# Patient Record
Sex: Female | Born: 1940 | Race: Black or African American | Hispanic: No | Smoking: Never smoker
Health system: Southern US, Community
[De-identification: ages and names within clinical notes are randomized; demographics above are authoritative.]

## PROBLEM LIST (undated history)

## (undated) DIAGNOSIS — J449 Chronic obstructive pulmonary disease, unspecified: Secondary | ICD-10-CM

## (undated) DIAGNOSIS — G2571 Drug induced akathisia: Secondary | ICD-10-CM

## (undated) DIAGNOSIS — D638 Anemia in other chronic diseases classified elsewhere: Secondary | ICD-10-CM

## (undated) DIAGNOSIS — E039 Hypothyroidism, unspecified: Secondary | ICD-10-CM

## (undated) DIAGNOSIS — E042 Nontoxic multinodular goiter: Secondary | ICD-10-CM

## (undated) DIAGNOSIS — H401290 Low-tension glaucoma, unspecified eye, stage unspecified: Secondary | ICD-10-CM

## (undated) DIAGNOSIS — E559 Vitamin D deficiency, unspecified: Secondary | ICD-10-CM

## (undated) DIAGNOSIS — J4489 Other specified chronic obstructive pulmonary disease: Secondary | ICD-10-CM

## (undated) DIAGNOSIS — N183 Chronic kidney disease, stage 3 unspecified: Secondary | ICD-10-CM

## (undated) DIAGNOSIS — G309 Alzheimer's disease, unspecified: Secondary | ICD-10-CM

## (undated) DIAGNOSIS — F0280 Dementia in other diseases classified elsewhere without behavioral disturbance: Secondary | ICD-10-CM

## (undated) DIAGNOSIS — I10 Essential (primary) hypertension: Secondary | ICD-10-CM

## (undated) DIAGNOSIS — K219 Gastro-esophageal reflux disease without esophagitis: Secondary | ICD-10-CM

## (undated) DIAGNOSIS — G4700 Insomnia, unspecified: Secondary | ICD-10-CM

## (undated) DIAGNOSIS — F323 Major depressive disorder, single episode, severe with psychotic features: Secondary | ICD-10-CM

## (undated) DIAGNOSIS — E1140 Type 2 diabetes mellitus with diabetic neuropathy, unspecified: Secondary | ICD-10-CM

## (undated) DIAGNOSIS — L439 Lichen planus, unspecified: Secondary | ICD-10-CM

## (undated) DIAGNOSIS — F0390 Unspecified dementia without behavioral disturbance: Secondary | ICD-10-CM

## (undated) HISTORY — DX: Drug induced akathisia: G25.71

## (undated) HISTORY — DX: Dementia in other diseases classified elsewhere, unspecified severity, without behavioral disturbance, psychotic disturbance, mood disturbance, and anxiety: F02.80

## (undated) HISTORY — DX: Chronic kidney disease, stage 3 (moderate): N18.3

## (undated) HISTORY — DX: Other specified chronic obstructive pulmonary disease: J44.89

## (undated) HISTORY — DX: Nontoxic multinodular goiter: E04.2

## (undated) HISTORY — DX: Hypothyroidism, unspecified: E03.9

## (undated) HISTORY — DX: Alzheimer's disease, unspecified: G30.9

## (undated) HISTORY — DX: Chronic kidney disease, stage 3 unspecified: N18.30

## (undated) HISTORY — DX: Gastro-esophageal reflux disease without esophagitis: K21.9

## (undated) HISTORY — DX: Anemia in other chronic diseases classified elsewhere: D63.8

## (undated) HISTORY — DX: Major depressive disorder, single episode, severe with psychotic features: F32.3

## (undated) HISTORY — DX: Low-tension glaucoma, unspecified eye, stage unspecified: H40.1290

## (undated) HISTORY — DX: Type 2 diabetes mellitus with diabetic neuropathy, unspecified: E11.40

## (undated) HISTORY — DX: Chronic obstructive pulmonary disease, unspecified: J44.9

## (undated) HISTORY — DX: Lichen planus, unspecified: L43.9

## (undated) HISTORY — DX: Insomnia, unspecified: G47.00

## (undated) HISTORY — PX: OTHER SURGICAL HISTORY: SHX169

## (undated) HISTORY — PX: ABDOMINAL HYSTERECTOMY: SHX81

## (undated) HISTORY — PX: KNEE SURGERY: SHX244

## (undated) HISTORY — DX: Vitamin D deficiency, unspecified: E55.9

---

## 2011-12-09 ENCOUNTER — Emergency Department (HOSPITAL_BASED_OUTPATIENT_CLINIC_OR_DEPARTMENT_OTHER)
Admission: EM | Admit: 2011-12-09 | Discharge: 2011-12-09 | Disposition: A | Discharge status: Home / Self Care | Payer: Medicare Other | Attending: Emergency Medicine | Admitting: Emergency Medicine

## 2011-12-09 ENCOUNTER — Emergency Department (HOSPITAL_BASED_OUTPATIENT_CLINIC_OR_DEPARTMENT_OTHER): Payer: Medicare Other

## 2011-12-09 ENCOUNTER — Encounter (HOSPITAL_BASED_OUTPATIENT_CLINIC_OR_DEPARTMENT_OTHER): Payer: Self-pay | Admitting: *Deleted

## 2011-12-09 DIAGNOSIS — Z79899 Other long term (current) drug therapy: Secondary | ICD-10-CM | POA: Insufficient documentation

## 2011-12-09 DIAGNOSIS — E860 Dehydration: Secondary | ICD-10-CM | POA: Insufficient documentation

## 2011-12-09 DIAGNOSIS — F0390 Unspecified dementia without behavioral disturbance: Secondary | ICD-10-CM | POA: Insufficient documentation

## 2011-12-09 DIAGNOSIS — N390 Urinary tract infection, site not specified: Secondary | ICD-10-CM | POA: Insufficient documentation

## 2011-12-09 DIAGNOSIS — E119 Type 2 diabetes mellitus without complications: Secondary | ICD-10-CM | POA: Insufficient documentation

## 2011-12-09 DIAGNOSIS — K5900 Constipation, unspecified: Secondary | ICD-10-CM | POA: Insufficient documentation

## 2011-12-09 DIAGNOSIS — I10 Essential (primary) hypertension: Secondary | ICD-10-CM | POA: Insufficient documentation

## 2011-12-09 HISTORY — DX: Essential (primary) hypertension: I10

## 2011-12-09 HISTORY — DX: Unspecified dementia, unspecified severity, without behavioral disturbance, psychotic disturbance, mood disturbance, and anxiety: F03.90

## 2011-12-09 LAB — COMPREHENSIVE METABOLIC PANEL
Albumin: 3.5 g/dL (ref 3.5–5.2)
BUN: 42 mg/dL — ABNORMAL HIGH (ref 6–23)
Calcium: 9.2 mg/dL (ref 8.4–10.5)
Creatinine, Ser: 2.3 mg/dL — ABNORMAL HIGH (ref 0.50–1.10)
Potassium: 3.6 mEq/L (ref 3.5–5.1)
Total Protein: 6.3 g/dL (ref 6.0–8.3)

## 2011-12-09 LAB — URINE MICROSCOPIC-ADD ON

## 2011-12-09 LAB — URINALYSIS, ROUTINE W REFLEX MICROSCOPIC
Glucose, UA: NEGATIVE mg/dL
Specific Gravity, Urine: 1.017 (ref 1.005–1.030)
pH: 5.5 (ref 5.0–8.0)

## 2011-12-09 LAB — CBC WITH DIFFERENTIAL/PLATELET
Basophils Relative: 0 % (ref 0–1)
Eosinophils Absolute: 0 10*3/uL (ref 0.0–0.7)
Hemoglobin: 8.8 g/dL — ABNORMAL LOW (ref 12.0–15.0)
MCH: 28.3 pg (ref 26.0–34.0)
MCHC: 32.8 g/dL (ref 30.0–36.0)
Monocytes Absolute: 0.4 10*3/uL (ref 0.1–1.0)
Monocytes Relative: 5 % (ref 3–12)
Neutrophils Relative %: 87 % — ABNORMAL HIGH (ref 43–77)

## 2011-12-09 MED ORDER — POLYETHYLENE GLYCOL 3350 17 G PO PACK: 17.0000 g | PACK | Freq: Every day | ORAL | Status: AC

## 2011-12-09 MED ORDER — SODIUM CHLORIDE 0.9 % IV BOLUS (SEPSIS)
500.0000 mL | Freq: Once | INTRAVENOUS | Status: AC
  Administered 2011-12-09: 19:00:00 via INTRAVENOUS

## 2011-12-09 MED ORDER — MINERAL OIL RE ENEM
1.0000 | ENEMA | Freq: Once | RECTAL | Status: AC
  Administered 2011-12-09: 1 via RECTAL
  Filled 2011-12-09: qty 1

## 2011-12-09 MED ORDER — CEPHALEXIN 500 MG PO CAPS: 500.0000 mg | ORAL_CAPSULE | Freq: Four times a day (QID) | ORAL | Status: AC

## 2011-12-09 NOTE — ED Provider Notes (Signed)
History     CSN: [id number]  Arrival date & time 12/09/11  1553   First MD Initiated Contact with Patient 12/09/11 1621      Chief Complaint  Patient presents with  . Constipation  . Dehydration    (Consider location/radiation/quality/duration/timing/severity/associated sxs/prior treatment) Patient is a 71 y.o. female presenting with constipation. The history is provided by the patient and a relative. No language interpreter was used.  Constipation  The current episode started more than 2 weeks ago. The problem occurs continuously. The problem has been gradually worsening. The pain is moderate. There was no prior successful therapy. There was no prior unsuccessful therapy. Associated symptoms include anorexia. She has been drinking less than usual and eating less than usual.  Pt recently moved here from [place] to be with family.   Pt complains of being constipated.  Pt's family reports pt has not been drinking normally.  They are concerned about dehydration.   Pt does not have a local MD  Past Medical History  Diagnosis Date  . Hypertension   . Diabetes mellitus   . Dementia     History reviewed. No pertinent past surgical history.  History reviewed. No pertinent family history.  History  Substance Use Topics  . Smoking status: Not on file  . Smokeless tobacco: Not on file  . Alcohol Use:     OB History    Grav Para Term Preterm Abortions TAB SAB Ect Mult Living                  Review of Systems  Gastrointestinal: Positive for constipation and anorexia.  All other systems reviewed and are negative.    Allergies  Review of patient's allergies indicates no known allergies.  Home Medications   Current Outpatient Rx  Name Route Sig Dispense Refill  . AMLODIPINE BESYLATE 10 MG PO TABS Oral Take 10 mg by mouth daily.    . DONEPEZIL HCL 10 MG PO TABS Oral Take 10 mg by mouth at bedtime as needed.    Marland Kitchen ESCITALOPRAM OXALATE 20 MG PO TABS Oral Take 20 mg by mouth  daily.    Marland Kitchen LEVOTHYROXINE SODIUM 25 MCG PO TABS Oral Take 25 mcg by mouth daily.    Marland Kitchen LISINOPRIL 20 MG PO TABS Oral Take 20 mg by mouth daily.    . ANTI-DIARRHEAL PO Oral Take 2 tablets by mouth daily as needed. For diarrheal symptoms.    Marland Kitchen METFORMIN HCL 500 MG PO TABS Oral Take 500 mg by mouth 2 (two) times daily with a meal.    . MIRTAZAPINE 30 MG PO TABS Oral Take 30 mg by mouth at bedtime.    Marland Kitchen PANTOPRAZOLE SODIUM 40 MG PO TBEC Oral Take 40 mg by mouth daily.    Marland Kitchen PRAVASTATIN SODIUM 40 MG PO TABS Oral Take 40 mg by mouth daily.    Marland Kitchen RISPERIDONE 2 MG PO TBDP Oral Take 2 mg by mouth 2 (two) times daily.    Marland Kitchen ZOLPIDEM TARTRATE 5 MG PO TABS Oral Take 5 mg by mouth at bedtime as needed.      BP 113/42  Pulse 106  Temp 98.2 F (36.8 C) (Oral)  Resp 16  Ht 5\' 1"  (1.549 m)  Wt 115 lb (52.164 kg)  BMI 21.73 kg/m2  SpO2 100%  Physical Exam  Nursing note and vitals reviewed. Constitutional: She is oriented to person, place, and time. She appears well-developed and well-nourished.  HENT:  Head: Normocephalic and atraumatic.  Eyes:  Conjunctivae and EOM are normal. Pupils are equal, round, and reactive to light.  Neck: Normal range of motion. Neck supple.  Cardiovascular: Normal rate and normal heart sounds.   Pulmonary/Chest: Effort normal and breath sounds normal.  Abdominal: Soft.  Musculoskeletal: Normal range of motion.  Neurological: She is alert and oriented to person, place, and time. She has normal reflexes.  Skin: Skin is warm.  Psychiatric: She has a normal mood and affect.    ED Course  Procedures (including critical care time)  Labs Reviewed - No data to display No results found.   No diagnosis found.  Results for orders placed during the hospital encounter of 12/09/11  CBC WITH DIFFERENTIAL      Component Value Range   WBC 8.7  4.0 - 10.5 K/uL   RBC 3.11 (*) 3.87 - 5.11 MIL/uL   Hemoglobin 8.8 (*) 12.0 - 15.0 g/dL   HCT 40.9 (*) 81.1 - 91.4 %   MCV 86.2  78.0  - 100.0 fL   MCH 28.3  26.0 - 34.0 pg   MCHC 32.8  30.0 - 36.0 g/dL   RDW 78.2  95.6 - 21.3 %   Platelets 256  150 - 400 K/uL   Neutrophils Relative 87 (*) 43 - 77 %   Neutro Abs 7.5  1.7 - 7.7 K/uL   Lymphocytes Relative 8 (*) 12 - 46 %   Lymphs Abs 0.7  0.7 - 4.0 K/uL   Monocytes Relative 5  3 - 12 %   Monocytes Absolute 0.4  0.1 - 1.0 K/uL   Eosinophils Relative 1  0 - 5 %   Eosinophils Absolute 0.0  0.0 - 0.7 K/uL   Basophils Relative 0  0 - 1 %   Basophils Absolute 0.0  0.0 - 0.1 K/uL  COMPREHENSIVE METABOLIC PANEL      Component Value Range   Sodium 144  135 - 145 mEq/L   Potassium 3.6  3.5 - 5.1 mEq/L   Chloride 104  96 - 112 mEq/L   CO2 25  19 - 32 mEq/L   Glucose, Bld 108 (*) 70 - 99 mg/dL   BUN 42 (*) 6 - 23 mg/dL   Creatinine, Ser 0.86 (*) 0.50 - 1.10 mg/dL   Calcium 9.2  8.4 - 57.8 mg/dL   Total Protein 6.3  6.0 - 8.3 g/dL   Albumin 3.5  3.5 - 5.2 g/dL   AST 13  0 - 37 U/L   ALT 8  0 - 35 U/L   Alkaline Phosphatase 52  39 - 117 U/L   Total Bilirubin 0.1 (*) 0.3 - 1.2 mg/dL   GFR calc non Af Amer 20 (*) >90 mL/min   GFR calc Af Amer 24 (*) >90 mL/min  URINALYSIS, ROUTINE W REFLEX MICROSCOPIC      Component Value Range   Color, Urine YELLOW  YELLOW   APPearance CLOUDY (*) CLEAR   Specific Gravity, Urine 1.017  1.005 - 1.030   pH 5.5  5.0 - 8.0   Glucose, UA NEGATIVE  NEGATIVE mg/dL   Hgb urine dipstick SMALL (*) NEGATIVE   Bilirubin Urine SMALL (*) NEGATIVE   Ketones, ur 15 (*) NEGATIVE mg/dL   Protein, ur NEGATIVE  NEGATIVE mg/dL   Urobilinogen, UA 1.0  0.0 - 1.0 mg/dL   Nitrite NEGATIVE  NEGATIVE   Leukocytes, UA LARGE (*) NEGATIVE  URINE MICROSCOPIC-ADD ON      Component Value Range   Squamous Epithelial / LPF FEW (*)  RARE   WBC, UA 11-20  <3 WBC/hpf   RBC / HPF 3-6  <3 RBC/hpf   Bacteria, UA FEW (*) RARE   Dg Abd Acute W/chest  12/09/2011  *RADIOLOGY REPORT*  Clinical Data: Abdominal pain, constipation  ACUTE ABDOMEN SERIES (ABDOMEN 2 VIEW & CHEST  1 VIEW)  Comparison: None.  Findings: Lungs are clear.  Heart size normal.  No effusion.  No free air.  Small bowel decompressed.  Large amount of fecal material in the distal colon and rectum.  No abnormal abdominal calcifications.  Spondylitic changes in the thoracic spine.  IMPRESSION:  1.  Nonobstructive bowel gas pattern with a large amount of distal colonic and rectal fecal material. 2.  No free air. 3.  No acute cardiopulmonary disease.  Original Report Authenticated By: Osa Craver, M.D.     MDM  Pt given IV fluids,  I will treat for uti.   I gave referral to Dr. Rodena Medin and primary MD's.   Pt given rx for miralax and keflex.           Lonia Skinner [place], [place] 12/09/11 1950

## 2011-12-09 NOTE — ED Provider Notes (Signed)
Medical screening examination/treatment/procedure(s) were performed by non-physician practitioner and as supervising physician I was immediately available for consultation/collaboration.   Charles B. Bernette Mayers, MD 12/09/11 [phone]

## 2011-12-09 NOTE — ED Notes (Signed)
Brought in by ems from home , c/o constipation and dehydration x 1 week

## 2011-12-09 NOTE — ED Notes (Signed)
EMS started IV and gave 500 ml ns d/t initial BP 60

## 2011-12-09 NOTE — ED Notes (Signed)
Pt transported to and from radiology via stretcher.

## 2012-10-28 ENCOUNTER — Other Ambulatory Visit: Payer: Self-pay | Admitting: Internal Medicine

## 2012-10-28 DIAGNOSIS — R109 Unspecified abdominal pain: Secondary | ICD-10-CM

## 2012-11-02 ENCOUNTER — Ambulatory Visit
Admission: RE | Admit: 2012-11-02 | Discharge: 2012-11-02 | Disposition: A | Discharge status: Home / Self Care | Payer: No Typology Code available for payment source | Source: Ambulatory Visit | Attending: Internal Medicine | Admitting: Internal Medicine

## 2012-11-02 DIAGNOSIS — R109 Unspecified abdominal pain: Secondary | ICD-10-CM

## 2013-04-12 ENCOUNTER — Ambulatory Visit: Payer: Self-pay | Admitting: Podiatry

## 2013-10-14 ENCOUNTER — Other Ambulatory Visit: Payer: Self-pay | Admitting: Family Medicine

## 2013-10-14 DIAGNOSIS — K219 Gastro-esophageal reflux disease without esophagitis: Secondary | ICD-10-CM

## 2013-10-14 DIAGNOSIS — R0989 Other specified symptoms and signs involving the circulatory and respiratory systems: Secondary | ICD-10-CM

## 2013-10-15 ENCOUNTER — Other Ambulatory Visit: Payer: Self-pay | Admitting: Family Medicine

## 2013-10-15 DIAGNOSIS — R0989 Other specified symptoms and signs involving the circulatory and respiratory systems: Secondary | ICD-10-CM

## 2013-10-15 DIAGNOSIS — K219 Gastro-esophageal reflux disease without esophagitis: Secondary | ICD-10-CM

## 2013-10-18 ENCOUNTER — Ambulatory Visit
Admission: RE | Admit: 2013-10-18 | Discharge: 2013-10-18 | Disposition: A | Discharge status: Home / Self Care | Payer: No Typology Code available for payment source | Source: Ambulatory Visit | Attending: Family Medicine | Admitting: Family Medicine

## 2013-10-18 ENCOUNTER — Other Ambulatory Visit: Payer: Self-pay | Admitting: Family Medicine

## 2013-10-18 DIAGNOSIS — K219 Gastro-esophageal reflux disease without esophagitis: Secondary | ICD-10-CM

## 2013-10-18 DIAGNOSIS — R0989 Other specified symptoms and signs involving the circulatory and respiratory systems: Secondary | ICD-10-CM

## 2013-10-20 ENCOUNTER — Encounter: Payer: Self-pay | Admitting: Gastroenterology

## 2013-10-26 ENCOUNTER — Other Ambulatory Visit (HOSPITAL_COMMUNITY): Payer: Self-pay | Admitting: *Deleted

## 2013-10-26 ENCOUNTER — Other Ambulatory Visit: Payer: Self-pay | Admitting: *Deleted

## 2013-10-26 DIAGNOSIS — E041 Nontoxic single thyroid nodule: Secondary | ICD-10-CM

## 2013-10-27 ENCOUNTER — Other Ambulatory Visit (HOSPITAL_COMMUNITY): Payer: Self-pay | Admitting: *Deleted

## 2013-10-27 DIAGNOSIS — E041 Nontoxic single thyroid nodule: Secondary | ICD-10-CM

## 2013-11-08 ENCOUNTER — Ambulatory Visit (HOSPITAL_COMMUNITY)
Admission: RE | Admit: 2013-11-08 | Discharge: 2013-11-08 | Disposition: A | Discharge status: Home / Self Care | Payer: Medicare (Managed Care) | Source: Ambulatory Visit | Attending: *Deleted | Admitting: *Deleted

## 2013-11-08 DIAGNOSIS — E041 Nontoxic single thyroid nodule: Secondary | ICD-10-CM

## 2013-11-08 DIAGNOSIS — E042 Nontoxic multinodular goiter: Secondary | ICD-10-CM | POA: Diagnosis present

## 2013-11-08 NOTE — Procedures (Signed)
  Rt thyroid nodule US biopsy 25 g x 4  Pt tolerated well

## 2013-12-09 ENCOUNTER — Other Ambulatory Visit (HOSPITAL_COMMUNITY): Payer: Self-pay | Admitting: Family Medicine

## 2013-12-09 DIAGNOSIS — H3412 Central retinal artery occlusion, left eye: Secondary | ICD-10-CM

## 2013-12-16 ENCOUNTER — Ambulatory Visit (HOSPITAL_COMMUNITY)
Admission: RE | Admit: 2013-12-16 | Discharge: 2013-12-16 | Disposition: A | Discharge status: Home / Self Care | Payer: Medicare (Managed Care) | Source: Ambulatory Visit | Attending: Family Medicine | Admitting: Family Medicine

## 2013-12-16 DIAGNOSIS — H3410 Central retinal artery occlusion, unspecified eye: Secondary | ICD-10-CM | POA: Insufficient documentation

## 2013-12-16 DIAGNOSIS — H3412 Central retinal artery occlusion, left eye: Secondary | ICD-10-CM

## 2013-12-16 DIAGNOSIS — R072 Precordial pain: Secondary | ICD-10-CM

## 2013-12-16 DIAGNOSIS — H349 Unspecified retinal vascular occlusion: Secondary | ICD-10-CM

## 2013-12-16 DIAGNOSIS — I517 Cardiomegaly: Secondary | ICD-10-CM

## 2013-12-16 DIAGNOSIS — I6529 Occlusion and stenosis of unspecified carotid artery: Secondary | ICD-10-CM | POA: Diagnosis not present

## 2013-12-16 NOTE — Progress Notes (Signed)
*  PRELIMINARY RESULTS* Vascular Ultrasound Carotid Duplex (Doppler) has been completed.  Findings suggest 1-39% internal carotid artery stenosis bilaterally. Vertebral arteries are patent with antegrade flow.  12/16/2013 7:21 PM Gertie FeyMichelle Skylynne Schlechter, RVT, RDCS, RDMS

## 2013-12-16 NOTE — Progress Notes (Signed)
Echo Lab  2D Echocardiogram completed.  Zenovia Justman L Ronson Hagins, RDCS 12/16/2013 1:18 PM

## 2013-12-28 ENCOUNTER — Ambulatory Visit: Payer: Medicare Other | Admitting: Gastroenterology

## 2013-12-28 ENCOUNTER — Encounter: Payer: Self-pay | Admitting: Gastroenterology

## 2014-03-02 ENCOUNTER — Encounter: Payer: Self-pay | Admitting: Gastroenterology

## 2014-03-02 ENCOUNTER — Ambulatory Visit (INDEPENDENT_AMBULATORY_CARE_PROVIDER_SITE_OTHER): Payer: Medicare (Managed Care) | Admitting: Gastroenterology

## 2014-03-02 VITALS — BP 130/70 | HR 80 | Ht 63.0 in | Wt 129.0 lb

## 2014-03-02 DIAGNOSIS — R1310 Dysphagia, unspecified: Secondary | ICD-10-CM

## 2014-03-02 MED ORDER — PANTOPRAZOLE SODIUM 40 MG PO TBEC
40.0000 mg | DELAYED_RELEASE_TABLET | Freq: Two times a day (BID) | ORAL | Status: DC
Start: 2014-03-02 — End: 2016-10-08

## 2014-03-02 MED ORDER — PANTOPRAZOLE SODIUM 40 MG PO TBEC: 40.0000 mg | DELAYED_RELEASE_TABLET | Freq: Two times a day (BID) | ORAL | Status: DC

## 2014-03-02 NOTE — Progress Notes (Signed)
HPI: This is a   Very pleasant 73-year-old woman whom I am meeting for the first time today. She is not a very good historian.  Feels food hangs, catches in mid throat.  No liquid dyshagia.  Will occasionally vomit the food.   For about 2 months.  Weight up and down.  Never had EGD.  Pyrosis, much worse lately.  Has taken tums at home, no help.    Listed to be taking protonix dialy, not sure how long.  Review of systems: Pertinent positive and negative review of systems were noted in the above HPI section. Complete review of systems was performed and was otherwise normal.    Past Medical History  Diagnosis Date  . Hypertension   . Diabetes mellitus   . Dementia   . Alzheimer's disease   . Anemia of other chronic disease   . Chronic obstructive asthma, unspecified   . Esophageal reflux   . Unspecified hypothyroidism   . Nontoxic multinodular goiter   . 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 Unspecified vitamin D deficiency   . Psychotic depression   . Insomnia   . Drug induced akathisia   . Diabetic neuropathy   . Chronic kidney disease (CKD), stage III (moderate)   . Lichen planus     History reviewed. No pertinent past surgical history.  Current Outpatient Prescriptions  Medication Sig Dispense Refill  . amLODipine (NORVASC) 10 MG tablet Take 10 mg by mouth daily.    . donepezil (ARICEPT) 10 MG tablet Take 10 mg by mouth at bedtime as needed.    . escitalopram (LEXAPRO) 20 MG tablet Take 20 mg by mouth daily.    . levothyroxine (SYNTHROID, LEVOTHROID) 25 MCG tablet Take 25 mcg by mouth daily.    . lisinopril (PRINIVIL,ZESTRIL) 20 MG tablet Take 20 mg by mouth daily.    . Loperamide HCl (ANTI-DIARRHEAL PO) Take 2 tablets by mouth daily as needed. For diarrheal symptoms.    . mirtazapine (REMERON) 30 MG tablet Take 30 mg by mouth at bedtime.    . pantoprazole (PROTONIX) 40 MG tablet Take 40 mg by mouth daily.    . pravastatin (PRAVACHOL) 40 MG tablet Take 40 mg  by mouth daily.    . risperiDONE (RISPERDAL M-TABS) 2 MG disintegrating tablet Take 2 mg by mouth 2 (two) times daily.    . zolpidem (AMBIEN) 5 MG tablet Take 5 mg by mouth at bedtime as needed.     No current facility-administered medications for this visit.    Allergies as of 03/02/2014  . (No Known Allergies)    Family History  Problem Relation Age of Onset  . Cancer Brother     ?  . Cancer Sister     ?  . Breast cancer Sister     x2  . Heart attack Sister     History   Social History  . Marital Status: Widowed    Spouse Name: N/A    Number of Children: N/A  . Years of Education: N/A   Occupational History  . Not on file.   Social History Main Topics  . Smoking status: Never Smoker   . Smokeless tobacco: Never Used  . Alcohol Use: No  . Drug Use: No  . Sexual Activity: Not on file   Other Topics Concern  . Not on file   Social History Narrative       Physical Exam: BP 130/70 mmHg  Pulse 80  Ht 5' 3" (1.6 m)    Wt 129 lb (58.514 kg)  BMI 22.86 kg/m2 Constitutional: generally well-appearing Psychiatric: alert and oriented x3 Eyes: extraocular movements intact Mouth: oral pharynx moist, no lesions Neck: supple no lymphadenopathy Cardiovascular: heart regular rate and rhythm Lungs: clear to auscultation bilaterally Abdomen: soft, nontender, nondistended, no obvious ascites, no peritoneal signs, normal bowel sounds Extremities: no lower extremity edema bilaterally Skin: no lesions on visible extremities    Assessment and plan: 73 y.o. female with  Chronic GERD, intermittent solid food dysphagia  I have recommended that she increase her proton pump inhibitor to twice daily shortly before meals. I would like to proceed with EGD at her soonest convenience for her chronic GERD and her intermittent dysphagia.

## 2014-03-02 NOTE — Patient Instructions (Addendum)
Increase your protonix to one pill twice daily (you will be handed a prescription) You will be set up for an upper endoscopy for GERD, dysphagia ([facility], MAC sedation)

## 2014-03-14 ENCOUNTER — Encounter (HOSPITAL_COMMUNITY): Payer: Self-pay | Admitting: *Deleted

## 2014-03-31 ENCOUNTER — Encounter (HOSPITAL_COMMUNITY): Payer: Self-pay | Admitting: Gastroenterology

## 2014-03-31 ENCOUNTER — Ambulatory Visit (HOSPITAL_COMMUNITY): Payer: Medicare (Managed Care) | Admitting: Anesthesiology

## 2014-03-31 ENCOUNTER — Ambulatory Visit (HOSPITAL_COMMUNITY)
Admission: RE | Admit: 2014-03-31 | Discharge: 2014-03-31 | Disposition: A | Discharge status: Home / Self Care | Payer: Medicare (Managed Care) | Source: Ambulatory Visit | Attending: Gastroenterology | Admitting: Gastroenterology

## 2014-03-31 ENCOUNTER — Encounter (HOSPITAL_COMMUNITY)
Admission: RE | Disposition: A | Discharge status: Home / Self Care | Payer: Self-pay | Source: Ambulatory Visit | Attending: Gastroenterology

## 2014-03-31 DIAGNOSIS — I129 Hypertensive chronic kidney disease with stage 1 through stage 4 chronic kidney disease, or unspecified chronic kidney disease: Secondary | ICD-10-CM | POA: Diagnosis not present

## 2014-03-31 DIAGNOSIS — G2571 Drug induced akathisia: Secondary | ICD-10-CM | POA: Insufficient documentation

## 2014-03-31 DIAGNOSIS — G309 Alzheimer's disease, unspecified: Secondary | ICD-10-CM | POA: Diagnosis not present

## 2014-03-31 DIAGNOSIS — H4010X0 Unspecified open-angle glaucoma, stage unspecified: Secondary | ICD-10-CM | POA: Diagnosis not present

## 2014-03-31 DIAGNOSIS — E1140 Type 2 diabetes mellitus with diabetic neuropathy, unspecified: Secondary | ICD-10-CM | POA: Diagnosis not present

## 2014-03-31 DIAGNOSIS — K219 Gastro-esophageal reflux disease without esophagitis: Secondary | ICD-10-CM | POA: Insufficient documentation

## 2014-03-31 DIAGNOSIS — Z803 Family history of malignant neoplasm of breast: Secondary | ICD-10-CM | POA: Insufficient documentation

## 2014-03-31 DIAGNOSIS — F323 Major depressive disorder, single episode, severe with psychotic features: Secondary | ICD-10-CM | POA: Diagnosis not present

## 2014-03-31 DIAGNOSIS — J449 Chronic obstructive pulmonary disease, unspecified: Secondary | ICD-10-CM | POA: Insufficient documentation

## 2014-03-31 DIAGNOSIS — Z8249 Family history of ischemic heart disease and other diseases of the circulatory system: Secondary | ICD-10-CM | POA: Diagnosis not present

## 2014-03-31 DIAGNOSIS — R1310 Dysphagia, unspecified: Secondary | ICD-10-CM | POA: Diagnosis present

## 2014-03-31 DIAGNOSIS — E039 Hypothyroidism, unspecified: Secondary | ICD-10-CM | POA: Insufficient documentation

## 2014-03-31 DIAGNOSIS — L439 Lichen planus, unspecified: Secondary | ICD-10-CM | POA: Diagnosis not present

## 2014-03-31 DIAGNOSIS — E042 Nontoxic multinodular goiter: Secondary | ICD-10-CM | POA: Insufficient documentation

## 2014-03-31 DIAGNOSIS — K449 Diaphragmatic hernia without obstruction or gangrene: Secondary | ICD-10-CM | POA: Insufficient documentation

## 2014-03-31 DIAGNOSIS — D649 Anemia, unspecified: Secondary | ICD-10-CM | POA: Insufficient documentation

## 2014-03-31 DIAGNOSIS — F0280 Dementia in other diseases classified elsewhere without behavioral disturbance: Secondary | ICD-10-CM | POA: Diagnosis not present

## 2014-03-31 DIAGNOSIS — E559 Vitamin D deficiency, unspecified: Secondary | ICD-10-CM | POA: Diagnosis not present

## 2014-03-31 DIAGNOSIS — N183 Chronic kidney disease, stage 3 (moderate): Secondary | ICD-10-CM | POA: Diagnosis not present

## 2014-03-31 DIAGNOSIS — G4700 Insomnia, unspecified: Secondary | ICD-10-CM | POA: Insufficient documentation

## 2014-03-31 HISTORY — PX: ESOPHAGOGASTRODUODENOSCOPY (EGD) WITH PROPOFOL: SHX5813

## 2014-03-31 SURGERY — ESOPHAGOGASTRODUODENOSCOPY (EGD) WITH PROPOFOL
Anesthesia: Monitor Anesthesia Care

## 2014-03-31 MED ORDER — PROPOFOL 10 MG/ML IV BOLUS
INTRAVENOUS | Status: AC
  Filled 2014-03-31: qty 20

## 2014-03-31 MED ORDER — MIDAZOLAM HCL 2 MG/2ML IJ SOLN
INTRAMUSCULAR | Status: AC
  Filled 2014-03-31: qty 2

## 2014-03-31 MED ORDER — PROPOFOL INFUSION 10 MG/ML OPTIME
INTRAVENOUS | Status: DC | PRN
  Administered 2014-03-31: 100 ug/kg/min via INTRAVENOUS

## 2014-03-31 MED ORDER — KETAMINE HCL 10 MG/ML IJ SOLN
INTRAMUSCULAR | Status: DC | PRN
  Administered 2014-03-31: 10 mg via INTRAVENOUS

## 2014-03-31 MED ORDER — FENTANYL CITRATE 0.05 MG/ML IJ SOLN: 25.0000 ug | INTRAMUSCULAR | Status: DC | PRN

## 2014-03-31 MED ORDER — LIDOCAINE HCL (CARDIAC) 20 MG/ML IV SOLN
INTRAVENOUS | Status: AC
  Filled 2014-03-31: qty 5

## 2014-03-31 MED ORDER — KETAMINE HCL 10 MG/ML IJ SOLN
INTRAMUSCULAR | Status: AC
  Filled 2014-03-31: qty 1

## 2014-03-31 MED ORDER — PROPOFOL 10 MG/ML IV BOLUS
INTRAVENOUS | Status: AC
  Filled 2014-03-31: qty 40

## 2014-03-31 MED ORDER — SODIUM CHLORIDE 0.9 % IV SOLN: INTRAVENOUS | Status: DC

## 2014-03-31 MED ORDER — LACTATED RINGERS IV SOLN
INTRAVENOUS | Status: DC
  Administered 2014-03-31: 1000 mL via INTRAVENOUS

## 2014-03-31 MED ORDER — LACTATED RINGERS IV SOLN
INTRAVENOUS | Status: DC
  Administered 2014-03-31: 11:00:00 via INTRAVENOUS

## 2014-03-31 MED ORDER — PROPOFOL 10 MG/ML IV BOLUS
INTRAVENOUS | Status: DC | PRN
  Administered 2014-03-31 (×4): 20 mg via INTRAVENOUS

## 2014-03-31 SURGICAL SUPPLY — 14 items

## 2014-03-31 NOTE — Interval H&P Note (Signed)
History and Physical Interval Note:  03/31/2014 10:00 AM  Norma Neal  has presented today for surgery, with the diagnosis of dysphagia  The various methods of treatment have been discussed with the patient and family. After consideration of risks, benefits and other options for treatment, the patient has consented to  Procedure(s): ESOPHAGOGASTRODUODENOSCOPY (EGD) WITH PROPOFOL (N/A) as a surgical intervention .  The patient's history has been reviewed, patient examined, no change in status, stable for surgery.  I have reviewed the patient's chart and labs.  Questions were answered to the patient's satisfaction.     Rachael FeeJacobs, Daniel P

## 2014-03-31 NOTE — Anesthesia Postprocedure Evaluation (Signed)
  Anesthesia Post-op Note  Patient: Norma Neal  Procedure(s) Performed: Procedure(s) (LRB): ESOPHAGOGASTRODUODENOSCOPY (EGD) WITH PROPOFOL (N/A)  Patient Location: PACU  Anesthesia Type: MAC  Level of Consciousness: awake and alert   Airway and Oxygen Therapy: Patient Spontanous Breathing  Post-op Pain: mild  Post-op Assessment: Post-op Vital signs reviewed, Patient's Cardiovascular Status Stable, Respiratory Function Stable, Patent Airway and No signs of Nausea or vomiting  Last Vitals:  Filed Vitals:   03/31/14 1235  BP:   Pulse:   Temp:   Resp: 13    Post-op Vital Signs: stable   Complications: No apparent anesthesia complications

## 2014-03-31 NOTE — Op Note (Signed)
[facility] [address] [place], [id number]   ENDOSCOPY PROCEDURE REPORT  PATIENT: Norma DragonOates, [name]  MR#: [id number] BIRTHDATE: [birth year] , 73  yrs. old GENDER: female ENDOSCOPIST: Rachael Feeaniel P Bessie Livingood, MD REFERRED BY:  Marny Lowensteinobert Koehler, M.D. PROCEDURE DATE:  03/31/2014 PROCEDURE:  EGD, diagnostic ASA CLASS:     Class III INDICATIONS:  GERD, intermittent dysphagia. MEDICATIONS: Monitored anesthesia care TOPICAL ANESTHETIC: none  DESCRIPTION OF PROCEDURE: After the risks benefits and alternatives of the procedure were thoroughly explained, informed consent was obtained.  The Pentax EG-[id number] peds [id number] endoscope was introduced through the mouth and advanced to the second portion of the duodenum , Without limitations.  The instrument was slowly withdrawn as the mucosa was fully examined.  There was a 4cm sliding hiatal hernia.  The examination was otherwise normal.  Retroflexed views revealed no abnormalities. The scope was then withdrawn from the patient and the procedure completed.  COMPLICATIONS: There were no immediate complications.  ENDOSCOPIC IMPRESSION: There was a 4cm sliding hiatal hernia.  The examination was otherwise normal  RECOMMENDATIONS: Continue taking protonix (antiacid medicine) twice daily.  This is best taken 20-30 min prior to breakfast and dinner meals.  Chew your food well, eat slowly and take small bites.  eSigned:  Rachael Feeaniel P Marchetta Navratil, MD 03/31/2014 12:11 PM

## 2014-03-31 NOTE — Discharge Instructions (Signed)
YOU HAD AN ENDOSCOPIC PROCEDURE TODAY: Refer to the procedure report that was given to you for any specific questions about what was found during the examination.  If the procedure report does not answer your questions, please call your gastroenterologist to clarify. ° °YOU SHOULD EXPECT: Some feelings of bloating in the abdomen. Passage of more gas than usual.  Walking can help get rid of the air that was put into your GI tract during the procedure and reduce the bloating. If you had a lower endoscopy (such as a colonoscopy or flexible sigmoidoscopy) you may notice spotting of blood in your stool or on the toilet paper.  ° °DIET: Your first meal following the procedure should be a light meal and then it is ok to progress to your normal diet.  A half-sandwich or bowl of soup is an example of a good first meal.  Heavy or fried foods are harder to digest and may make you feel nasueas or bloated.  Drink plenty of fluids but you should avoid alcoholic beverages for 24 hours. ° °ACTIVITY: Your care partner should take you home directly after the procedure.  You should plan to take it easy, moving slowly for the rest of the day.  You can resume normal activity the day after the procedure however you should NOT DRIVE or use heavy machinery for 24 hours (because of the sedation medicines used during the test).   ° °SYMPTOMS TO REPORT IMMEDIATELY  °A gastroenterologist can be reached at any hour.  Please call your doctor's office for any of the following symptoms: ° °· Following lower endoscopy (colonoscopy, flexible sigmoidoscopy) ° Excessive amounts of blood in the stool ° Significant tenderness, worsening of abdominal pains ° Swelling of the abdomen that is new, acute ° Fever of 100° or higher °· Following upper endoscopy (EGD, EUS, ERCP) ° Vomiting of blood or coffee ground material ° New, significant abdominal pain ° New, significant chest pain or pain under the shoulder blades ° Painful or persistently difficult  swallowing ° New shortness of breath ° Black, tarry-looking stools ° °FOLLOW UP: °If any biopsies were taken you will be contacted by phone or by letter within the next 1-3 weeks.  Call your gastroenterologist if you have not heard about the biopsies in 3 weeks.  °Please also call your gastroenterologist's office with any specific questions about appointments or follow up tests. ° °Conscious Sedation, Adult, Care After °Refer to this sheet in the next few weeks. These instructions provide you with information on caring for yourself after your procedure. Your health care provider may also give you more specific instructions. Your treatment has been planned according to current medical practices, but problems sometimes occur. Call your health care provider if you have any problems or questions after your procedure. °WHAT TO EXPECT AFTER THE PROCEDURE  °After your procedure: °· You may feel sleepy, clumsy, and have poor balance for several hours. °· Vomiting may occur if you eat too soon after the procedure. °HOME CARE INSTRUCTIONS °· Do not participate in any activities where you could become injured for at least 24 hours. Do not: °¨ Drive. °¨ Swim. °¨ Ride a bicycle. °¨ Operate heavy machinery. °¨ Cook. °¨ Use power tools. °¨ Climb ladders. °¨ Work from a high place. °· Do not make important decisions or sign legal documents until you are improved. °· If you vomit, drink water, juice, or soup when you can drink without vomiting. Make sure you have little or no nausea before eating   solid foods. °· Only take over-the-counter or prescription medicines for pain, discomfort, or fever as directed by your health care provider. °· Make sure you and your family fully understand everything about the medicines given to you, including what side effects may occur. °· You should not drink alcohol, take sleeping pills, or take medicines that cause drowsiness for at least 24 hours. °· If you smoke, do not smoke without  supervision. °· If you are feeling better, you may resume normal activities 24 hours after you were sedated. °· Keep all appointments with your health care provider. °SEEK MEDICAL CARE IF: °· Your skin is pale or bluish in color. °· You continue to feel nauseous or vomit. °· Your pain is getting worse and is not helped by medicine. °· You have bleeding or swelling. °· You are still sleepy or feeling clumsy after 24 hours. °SEEK IMMEDIATE MEDICAL CARE IF: °· You develop a rash. °· You have difficulty breathing. °· You develop any type of allergic problem. °· You have a fever. °MAKE SURE YOU: °· Understand these instructions. °· Will watch your condition. °· Will get help right away if you are not doing well or get worse. °Document Released: 02/03/2013 Document Reviewed: 02/03/2013 °[employer]® Patient Information ©2015 [employer]. This information is not intended to replace advice given to you by your health care provider. Make sure you discuss any questions you have with your health care provider. ° °

## 2014-03-31 NOTE — Anesthesia Preprocedure Evaluation (Addendum)
Anesthesia Evaluation  Patient identified by MRN, date of birth, ID band Patient awake    Reviewed: Allergy & Precautions, H&P , NPO status , Patient's Chart, lab work & pertinent test results  Airway Mallampati: II  TM Distance: >3 FB Neck ROM: full    Dental no notable dental hx. (+) Teeth Intact, Dental Advisory Given   Pulmonary neg pulmonary ROS, asthma , COPD breath sounds clear to auscultation  Pulmonary exam normal       Cardiovascular hypertension, Pt. on medications Rhythm:regular Rate:Normal     Neuro/Psych Alzheimer's dementia negative neurological ROS  negative psych ROS   GI/Hepatic negative GI ROS, Neg liver ROS, GERD-  Medicated and Controlled,  Endo/Other  diabetes, Well Controlled, Type 2Hypothyroidism Diet controlled DM  Renal/GU Renal diseaseStage 3 chronic kidney disease  negative genitourinary   Musculoskeletal   Abdominal   Peds  Hematology negative hematology ROS (+)   Anesthesia Other Findings   Reproductive/Obstetrics negative OB ROS                           Anesthesia Physical Anesthesia Plan  ASA: III  Anesthesia Plan: MAC   Post-op Pain Management:    Induction:   Airway Management Planned:   Additional Equipment:   Intra-op Plan:   Post-operative Plan:   Informed Consent: I have reviewed the patients History and Physical, chart, labs and discussed the procedure including the risks, benefits and alternatives for the proposed anesthesia with the patient or authorized representative who has indicated his/her understanding and acceptance.   Dental Advisory Given  Plan Discussed with: CRNA and Surgeon  Anesthesia Plan Comments:         Anesthesia Quick Evaluation

## 2014-03-31 NOTE — Transfer of Care (Signed)
Immediate Anesthesia Transfer of Care Note  Patient: Norma Neal  Procedure(s) Performed: Procedure(s) (LRB): ESOPHAGOGASTRODUODENOSCOPY (EGD) WITH PROPOFOL (N/A)  Patient Location: PACU  Anesthesia Type: MAC  Level of Consciousness: sedated, patient cooperative and responds to stimulation  Airway & Oxygen Therapy: Patient Spontanous Breathing and Patient connected to face mask oxgen  Post-op Assessment: Report given to PACU RN and Post -op Vital signs reviewed and stable  Post vital signs: Reviewed and stable  Complications: No apparent anesthesia complications

## 2014-03-31 NOTE — H&P (View-Only) (Signed)
HPI: This is a   Very pleasant 73 year old woman whom I am meeting for the first time today. She is not a very good historian.  Feels food hangs, catches in mid throat.  No liquid dyshagia.  Will occasionally vomit the food.   For about 2 months.  Weight up and down.  Never had EGD.  Pyrosis, much worse lately.  Has taken tums at home, no help.    Listed to be taking protonix dialy, not sure how long.  Review of systems: Pertinent positive and negative review of systems were noted in the above HPI section. Complete review of systems was performed and was otherwise normal.    Past Medical History  Diagnosis Date  . Hypertension   . Diabetes mellitus   . Dementia   . Alzheimer's disease   . Anemia of other chronic disease   . Chronic obstructive asthma, unspecified   . Esophageal reflux   . Unspecified hypothyroidism   . Nontoxic multinodular goiter   . 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 Unspecified vitamin D deficiency   . Psychotic depression   . Insomnia   . Drug induced akathisia   . Diabetic neuropathy   . Chronic kidney disease (CKD), stage III (moderate)   . Lichen planus     History reviewed. No pertinent past surgical history.  Current Outpatient Prescriptions  Medication Sig Dispense Refill  . amLODipine (NORVASC) 10 MG tablet Take 10 mg by mouth daily.    Marland Kitchen. donepezil (ARICEPT) 10 MG tablet Take 10 mg by mouth at bedtime as needed.    Marland Kitchen. escitalopram (LEXAPRO) 20 MG tablet Take 20 mg by mouth daily.    Marland Kitchen. levothyroxine (SYNTHROID, LEVOTHROID) 25 MCG tablet Take 25 mcg by mouth daily.    Marland Kitchen. lisinopril (PRINIVIL,ZESTRIL) 20 MG tablet Take 20 mg by mouth daily.    . Loperamide HCl (ANTI-DIARRHEAL PO) Take 2 tablets by mouth daily as needed. For diarrheal symptoms.    . mirtazapine (REMERON) 30 MG tablet Take 30 mg by mouth at bedtime.    . pantoprazole (PROTONIX) 40 MG tablet Take 40 mg by mouth daily.    . pravastatin (PRAVACHOL) 40 MG tablet Take 40 mg  by mouth daily.    . risperiDONE (RISPERDAL M-TABS) 2 MG disintegrating tablet Take 2 mg by mouth 2 (two) times daily.    Marland Kitchen. zolpidem (AMBIEN) 5 MG tablet Take 5 mg by mouth at bedtime as needed.     No current facility-administered medications for this visit.    Allergies as of 03/02/2014  . (No Known Allergies)    Family History  Problem Relation Age of Onset  . Cancer Brother     ?  Marland Kitchen. Cancer Sister     ?  Marland Kitchen. Breast cancer Sister     x2  . Heart attack Sister     History   Social History  . Marital Status: Widowed    Spouse Name: N/A    Number of Children: N/A  . Years of Education: N/A   Occupational History  . Not on file.   Social History Main Topics  . Smoking status: Never Smoker   . Smokeless tobacco: Never Used  . Alcohol Use: No  . Drug Use: No  . Sexual Activity: Not on file   Other Topics Concern  . Not on file   Social History Narrative       Physical Exam: BP 130/70 mmHg  Pulse 80  Ht 5\' 3"  (1.6 m)  Wt 129 lb (58.514 kg)  BMI 22.86 kg/m2 Constitutional: generally well-appearing Psychiatric: alert and oriented x3 Eyes: extraocular movements intact Mouth: oral pharynx moist, no lesions Neck: supple no lymphadenopathy Cardiovascular: heart regular rate and rhythm Lungs: clear to auscultation bilaterally Abdomen: soft, nontender, nondistended, no obvious ascites, no peritoneal signs, normal bowel sounds Extremities: no lower extremity edema bilaterally Skin: no lesions on visible extremities    Assessment and plan: 73 y.o. female with  Chronic GERD, intermittent solid food dysphagia  I have recommended that she increase her proton pump inhibitor to twice daily shortly before meals. I would like to proceed with EGD at her soonest convenience for her chronic GERD and her intermittent dysphagia.

## 2014-03-31 NOTE — Progress Notes (Signed)
Patient difficult iv stick.Patient informed to relay information on difficult prior to procedures requiring iv infusion.

## 2014-04-01 ENCOUNTER — Encounter (HOSPITAL_COMMUNITY): Payer: Self-pay | Admitting: Gastroenterology

## 2014-04-25 ENCOUNTER — Ambulatory Visit (INDEPENDENT_AMBULATORY_CARE_PROVIDER_SITE_OTHER): Payer: Medicare (Managed Care) | Admitting: Neurology

## 2014-04-25 ENCOUNTER — Encounter: Payer: Self-pay | Admitting: Neurology

## 2014-04-25 VITALS — BP 138/74 | HR 84 | Temp 97.2°F | Resp 18 | Ht 63.0 in | Wt 137.9 lb

## 2014-04-25 DIAGNOSIS — M791 Myalgia, unspecified site: Secondary | ICD-10-CM

## 2014-04-25 DIAGNOSIS — G720 Drug-induced myopathy: Secondary | ICD-10-CM

## 2014-04-25 DIAGNOSIS — T466X5A Adverse effect of antihyperlipidemic and antiarteriosclerotic drugs, initial encounter: Secondary | ICD-10-CM

## 2014-04-25 DIAGNOSIS — G2401 Drug induced subacute dyskinesia: Secondary | ICD-10-CM

## 2014-04-25 LAB — SEDIMENTATION RATE: Sed Rate: 22 mm/hr (ref 0–22)

## 2014-04-25 LAB — CK: CK TOTAL: 145 U/L (ref 7–177)

## 2014-04-25 NOTE — Progress Notes (Signed)
Norma Neal was seen today in neurologic consultation at the request of Dr. Ardis Hughs.  Her PCP is Sherian Maroon, MD.   Pt is accompanied by her daughter in law who supplements the history.  The records that were made available to me were reviewed.  Pt has a hx of abnormal movements that have been going on for years and have been attributed to a hx of antipsychotic use.  She has been off of antipsychotics for years (at least 3 per daughter in law).  She was started on propranolol recently and it didn't change movement.  Apparently, however, she has been c/o leg soreness and the question has arisen on whether or not this is related to the movement.  [facility] from [facility] report she is not on a statin but she reports she has been on pravochol for at least 5 years and her daughter in law agrees.  Her PCP has checked her labs and her CPK was just mildly elevated at 250 in June and were 222 in Oct.  Pt reports that leg soreness started 3-4 months ago.  Leg soreness is in the bilateral thigh, L greater than right.  It is worse with walking.  She cannot describe the quality of the pain to me.  It is not associated with back pain.  It comes and goes.  She is not sure if they are sore to the touch.  She is off balance somewhat but no falls. The patient thought that she was here for MRI brain.   ALLERGIES:  No Known Allergies  CURRENT MEDICATIONS:  Outpatient Encounter Prescriptions as of 04/25/2014  Medication Sig  . amLODipine (NORVASC) 10 MG tablet Take 10 mg by mouth daily.  Marland Kitchen donepezil (ARICEPT) 10 MG tablet Take 10 mg by mouth at bedtime as needed.  Marland Kitchen escitalopram (LEXAPRO) 20 MG tablet Take 20 mg by mouth daily.  Marland Kitchen levothyroxine (SYNTHROID, LEVOTHROID) 25 MCG tablet Take 25 mcg by mouth daily.  Marland Kitchen lisinopril (PRINIVIL,ZESTRIL) 20 MG tablet Take 20 mg by mouth daily.  . Loperamide HCl (ANTI-DIARRHEAL PO) Take 2 tablets by mouth daily as needed. For diarrheal symptoms.  . mirtazapine (REMERON) 30 MG  tablet Take 30 mg by mouth at bedtime.  . pantoprazole (PROTONIX) 40 MG tablet Take 1 tablet (40 mg total) by mouth 2 (two) times daily before a meal.  . pravastatin (PRAVACHOL) 40 MG tablet Take 40 mg by mouth daily.  . risperiDONE (RISPERDAL M-TABS) 2 MG disintegrating tablet Take 2 mg by mouth 2 (two) times daily.  Marland Kitchen zolpidem (AMBIEN) 5 MG tablet Take 5 mg by mouth at bedtime as needed.    PAST MEDICAL HISTORY:   Past Medical History  Diagnosis Date  . Hypertension   . Diabetes mellitus   . Dementia   . Alzheimer's disease   . Anemia of other chronic disease   . Chronic obstructive asthma, unspecified   . Esophageal reflux   . Unspecified hypothyroidism   . Nontoxic multinodular goiter   . 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 Unspecified vitamin D deficiency   . Psychotic depression   . Insomnia   . Drug induced akathisia   . Diabetic neuropathy   . Chronic kidney disease (CKD), stage III (moderate)   . Lichen planus     PAST SURGICAL HISTORY:   Past Surgical History  Procedure Laterality Date  . Abdominal hysterectomy  yrs ago    ovaries also  . Knee surgery Left yrs ago  . Partial thryoidectomy  yrs ago  . Esophagogastroduodenoscopy (egd) with propofol N/A 03/31/2014    Procedure: ESOPHAGOGASTRODUODENOSCOPY (EGD) WITH PROPOFOL;  Surgeon: Milus Banister, MD;  Location: [facility];  Service: Endoscopy;  Laterality: N/A;    SOCIAL HISTORY:   History   Social History  . Marital Status: Widowed    Spouse Name: N/A    Number of Children: N/A  . Years of Education: N/A   Occupational History  . Not on file.   Social History Main Topics  . Smoking status: Never Smoker   . Smokeless tobacco: Never Used  . Alcohol Use: No  . Drug Use: No  . Sexual Activity: No   Other Topics Concern  . Not on file   Social History Narrative    FAMILY HISTORY:   No fam hx of movement d/o No family status information on file.    ROS:  A complete 10 system review  of systems was obtained and was unremarkable apart from what is mentioned above.  PHYSICAL EXAMINATION:    VITALS:   Filed Vitals:   04/25/14 1008  BP: 138/74  Pulse: 84  Temp: 97.2 F (36.2 C)  Resp: 18  Height: _0  (1.6 m)  Weight: 137 lb 14.4 oz (62.551 kg)  SpO2: 98%    GEN:  Normal appears female in no acute distress.  Appears stated age. HEENT:  Normocephalic, atraumatic. The mucous membranes are moist. The superficial temporal arteries are without ropiness or tenderness. Cardiovascular: Regular rate and rhythm. Lungs: Clear to auscultation bilaterally. Neck/Heme: There are no carotid bruits noted bilaterally.  NEUROLOGICAL: Orientation:  The patient is alert but not oriented to month/date/year.   Cranial nerves: There is good facial symmetry. The pupils are equal round and reactive to light bilaterally. Fundoscopic exam reveals clear disc margins bilaterally. Extraocular muscles are intact.  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  Speech is fluent and clear. Soft palate rises symmetrically and there is no tongue deviation. Hearing is intact to conversational tone. Tone: Tone is good throughout. Sensation: Sensation is intact to light touch and pinprick throughout (facial, trunk, extremities). Vibration is mildly decreased at the bilateral big toe. There is no consistent extinction with double simultaneous stimulation. There is no sensory dermatomal level identified.  Legs mildly sore to touch proximally Coordination:  The patient has no difficulty with RAM's or FNF bilaterally. Motor: apraxic when testing MMT.  Strength at least 5-/5 in the UE/LE.  There is no pronator drift.  There are no fasciculations noted. DTR's: Deep tendon reflexes are 2-/4 at the bilateral biceps, triceps, brachioradialis, patella and achilles.  Plantar responses are neutral bilaterally. Gait and Station: The patient is slow and tenuous with ambulation and mildly unsteady Abnormal movments:   orobuccolingual diskinesia with tongue not protruding out of mouth.  Dyskinesia of hands and mild of legs   IMPRESSION/PLAN  1. Tardive dyskinesia  -is not akathisia.  Likely due to past exposure to antipsychotics.  Not xenaxine candidate due to depression history.  Discussed with them today.    -doubt related to leg soreness 2.  Myalgia  -on pravachol and wonder if statin induced mild myopathy.  -refuses EMG for now.    -will talk to PCP about holding pravachol to see if sx's resolve.  If not, will see if willing to do EMG then  -ESR/CPK/Aldolase today 3.  R homonoymous hemianopsia  -do MRI brain without.  May have old infarct?  May be cognitive difficulty participating with VF testing.  Want to make  sure not missing something new.

## 2014-04-25 NOTE — Patient Instructions (Addendum)
1.  Make an appointment with your primary care physician and see if you can stop the pravachol.  IF your leg discomfort doesn't go away in 4 weeks after you stop it, call and we will schedule the EMG test that we talked about 2.  We will schedule the MRI brain.  Remember that I expect to see that the brain has shrunk and that there is hardening of arteries in the brain [facility]  05/09/14/2:45pm 3.  I expect that the movements you have are permanent but don't expect them to worsen

## 2014-04-28 LAB — ALDOLASE: Aldolase: 5.4 U/L (ref <=8.1)

## 2014-04-28 NOTE — Progress Notes (Signed)
NOTE FAXED

## 2014-05-09 ENCOUNTER — Ambulatory Visit (HOSPITAL_COMMUNITY)
Admission: RE | Admit: 2014-05-09 | Discharge: 2014-05-09 | Disposition: A | Discharge status: Home / Self Care | Payer: Medicare (Managed Care) | Source: Ambulatory Visit | Attending: Neurology | Admitting: Neurology

## 2014-05-09 DIAGNOSIS — R410 Disorientation, unspecified: Secondary | ICD-10-CM | POA: Diagnosis not present

## 2014-05-09 DIAGNOSIS — M791 Myalgia, unspecified site: Secondary | ICD-10-CM

## 2014-05-09 DIAGNOSIS — R2681 Unsteadiness on feet: Secondary | ICD-10-CM | POA: Insufficient documentation

## 2014-05-10 ENCOUNTER — Telehealth: Payer: Self-pay | Admitting: Neurology

## 2014-05-10 NOTE — Telephone Encounter (Signed)
[facility] machine [facility] to call back and let me know if okay to start patient on ASA 81 daily. Awaiting call back.

## 2014-05-10 NOTE — Telephone Encounter (Signed)
-----   Message from Octaviano Battyebecca S Tat, DO sent at 05/10/2014  8:21 AM EST ----- Reviewed.  Motion artifact esp on the axial T2 flair.  Ext small vessel disease.  Jade, let pt/family know that old infarcts that certainly account for the vision loss (make sure that not driving).  Ask PCP if okay to take ASA EC, 81 mg.  Nothing new on the examination however.  Everything is old.

## 2014-05-11 NOTE — Telephone Encounter (Signed)
Left message on machine for patient to call back.

## 2014-05-12 NOTE — Telephone Encounter (Signed)
Norma AbrahamsMary Neal with patient's PCP called to let Norma Neal know they put patient on ASA 81 mg.

## 2014-08-17 ENCOUNTER — Emergency Department (HOSPITAL_COMMUNITY): Payer: Medicare (Managed Care)

## 2014-08-17 ENCOUNTER — Observation Stay (HOSPITAL_COMMUNITY): Payer: Medicare (Managed Care)

## 2014-08-17 ENCOUNTER — Observation Stay (HOSPITAL_COMMUNITY)
Admission: EM | Admit: 2014-08-17 | Discharge: 2014-08-21 | Disposition: A | Discharge status: Home / Self Care | Payer: Medicare (Managed Care) | Attending: Internal Medicine | Admitting: Internal Medicine

## 2014-08-17 ENCOUNTER — Encounter (HOSPITAL_COMMUNITY): Payer: Self-pay | Admitting: Vascular Surgery

## 2014-08-17 DIAGNOSIS — E1140 Type 2 diabetes mellitus with diabetic neuropathy, unspecified: Secondary | ICD-10-CM | POA: Diagnosis not present

## 2014-08-17 DIAGNOSIS — E039 Hypothyroidism, unspecified: Secondary | ICD-10-CM | POA: Diagnosis not present

## 2014-08-17 DIAGNOSIS — G309 Alzheimer's disease, unspecified: Secondary | ICD-10-CM | POA: Insufficient documentation

## 2014-08-17 DIAGNOSIS — E785 Hyperlipidemia, unspecified: Secondary | ICD-10-CM | POA: Diagnosis not present

## 2014-08-17 DIAGNOSIS — G459 Transient cerebral ischemic attack, unspecified: Principal | ICD-10-CM | POA: Insufficient documentation

## 2014-08-17 DIAGNOSIS — R270 Ataxia, unspecified: Secondary | ICD-10-CM

## 2014-08-17 DIAGNOSIS — G458 Other transient cerebral ischemic attacks and related syndromes: Secondary | ICD-10-CM

## 2014-08-17 DIAGNOSIS — E1121 Type 2 diabetes mellitus with diabetic nephropathy: Secondary | ICD-10-CM | POA: Insufficient documentation

## 2014-08-17 DIAGNOSIS — Z90710 Acquired absence of both cervix and uterus: Secondary | ICD-10-CM | POA: Diagnosis not present

## 2014-08-17 DIAGNOSIS — R4781 Slurred speech: Secondary | ICD-10-CM | POA: Diagnosis present

## 2014-08-17 DIAGNOSIS — D638 Anemia in other chronic diseases classified elsewhere: Secondary | ICD-10-CM | POA: Diagnosis not present

## 2014-08-17 DIAGNOSIS — I951 Orthostatic hypotension: Secondary | ICD-10-CM | POA: Diagnosis not present

## 2014-08-17 DIAGNOSIS — R262 Difficulty in walking, not elsewhere classified: Secondary | ICD-10-CM | POA: Diagnosis present

## 2014-08-17 DIAGNOSIS — N183 Chronic kidney disease, stage 3 (moderate): Secondary | ICD-10-CM | POA: Diagnosis not present

## 2014-08-17 DIAGNOSIS — K219 Gastro-esophageal reflux disease without esophagitis: Secondary | ICD-10-CM | POA: Diagnosis not present

## 2014-08-17 DIAGNOSIS — F0280 Dementia in other diseases classified elsewhere without behavioral disturbance: Secondary | ICD-10-CM | POA: Diagnosis not present

## 2014-08-17 DIAGNOSIS — I129 Hypertensive chronic kidney disease with stage 1 through stage 4 chronic kidney disease, or unspecified chronic kidney disease: Secondary | ICD-10-CM | POA: Diagnosis not present

## 2014-08-17 DIAGNOSIS — F329 Major depressive disorder, single episode, unspecified: Secondary | ICD-10-CM | POA: Insufficient documentation

## 2014-08-17 DIAGNOSIS — J449 Chronic obstructive pulmonary disease, unspecified: Secondary | ICD-10-CM | POA: Insufficient documentation

## 2014-08-17 LAB — URINALYSIS, ROUTINE W REFLEX MICROSCOPIC
Bilirubin Urine: NEGATIVE
GLUCOSE, UA: NEGATIVE mg/dL
HGB URINE DIPSTICK: NEGATIVE
Ketones, ur: NEGATIVE mg/dL
Nitrite: NEGATIVE
PH: 6.5 (ref 5.0–8.0)
Protein, ur: NEGATIVE mg/dL
SPECIFIC GRAVITY, URINE: 1.005 (ref 1.005–1.030)
Urobilinogen, UA: 0.2 mg/dL (ref 0.0–1.0)

## 2014-08-17 LAB — CBC
HEMATOCRIT: 32.2 % — AB (ref 36.0–46.0)
Hemoglobin: 10.9 g/dL — ABNORMAL LOW (ref 12.0–15.0)
MCH: 30.3 pg (ref 26.0–34.0)
MCHC: 33.9 g/dL (ref 30.0–36.0)
MCV: 89.4 fL (ref 78.0–100.0)
Platelets: 179 10*3/uL (ref 150–400)
RBC: 3.6 MIL/uL — AB (ref 3.87–5.11)
RDW: 14.1 % (ref 11.5–15.5)
WBC: 5.3 10*3/uL (ref 4.0–10.5)

## 2014-08-17 LAB — COMPREHENSIVE METABOLIC PANEL
ALT: 11 U/L (ref 0–35)
AST: 21 U/L (ref 0–37)
Albumin: 3.8 g/dL (ref 3.5–5.2)
Alkaline Phosphatase: 69 U/L (ref 39–117)
Anion gap: 10 (ref 5–15)
BUN: 18 mg/dL (ref 6–23)
CALCIUM: 9.9 mg/dL (ref 8.4–10.5)
CO2: 24 mmol/L (ref 19–32)
CREATININE: 1.44 mg/dL — AB (ref 0.50–1.10)
Chloride: 111 mmol/L (ref 96–112)
GFR, EST AFRICAN AMERICAN: 41 mL/min — AB (ref >90)
GFR, EST NON AFRICAN AMERICAN: 35 mL/min — AB (ref >90)
GLUCOSE: 92 mg/dL (ref 70–99)
Potassium: 3.5 mmol/L (ref 3.5–5.1)
Sodium: 145 mmol/L (ref 135–145)
TOTAL PROTEIN: 6.6 g/dL (ref 6.0–8.3)
Total Bilirubin: 0.7 mg/dL (ref 0.3–1.2)

## 2014-08-17 LAB — DIFFERENTIAL
Basophils Absolute: 0 10*3/uL (ref 0.0–0.1)
Basophils Relative: 0 % (ref 0–1)
Eosinophils Absolute: 0.2 10*3/uL (ref 0.0–0.7)
Eosinophils Relative: 5 % (ref 0–5)
LYMPHS PCT: 26 % (ref 12–46)
Lymphs Abs: 1.4 10*3/uL (ref 0.7–4.0)
MONO ABS: 0.2 10*3/uL (ref 0.1–1.0)
Monocytes Relative: 4 % (ref 3–12)
NEUTROS PCT: 65 % (ref 43–77)
Neutro Abs: 3.5 10*3/uL (ref 1.7–7.7)

## 2014-08-17 LAB — CK: Total CK: 197 U/L — ABNORMAL HIGH (ref 7–177)

## 2014-08-17 LAB — I-STAT CHEM 8, ED
BUN: 19 mg/dL (ref 6–23)
CREATININE: 1.4 mg/dL — AB (ref 0.50–1.10)
Calcium, Ion: 1.32 mmol/L — ABNORMAL HIGH (ref 1.13–1.30)
Chloride: 111 mmol/L (ref 96–112)
GLUCOSE: 89 mg/dL (ref 70–99)
HCT: 34 % — ABNORMAL LOW (ref 36.0–46.0)
HEMOGLOBIN: 11.6 g/dL — AB (ref 12.0–15.0)
Potassium: 3.3 mmol/L — ABNORMAL LOW (ref 3.5–5.1)
SODIUM: 146 mmol/L — AB (ref 135–145)
TCO2: 21 mmol/L (ref 0–100)

## 2014-08-17 LAB — URINE MICROSCOPIC-ADD ON

## 2014-08-17 LAB — RAPID URINE DRUG SCREEN, HOSP PERFORMED
AMPHETAMINES: NOT DETECTED
Barbiturates: NOT DETECTED
Benzodiazepines: NOT DETECTED
Cocaine: NOT DETECTED
Opiates: NOT DETECTED
Tetrahydrocannabinol: NOT DETECTED

## 2014-08-17 LAB — ETHANOL: Alcohol, Ethyl (B): <5 mg/dL (ref 0–9)

## 2014-08-17 LAB — I-STAT TROPONIN, ED: Troponin i, poc: 0 ng/mL (ref 0.00–0.08)

## 2014-08-17 LAB — PROTIME-INR
INR: 0.87 (ref 0.00–1.49)
Prothrombin Time: 11.9 seconds (ref 11.6–15.2)

## 2014-08-17 LAB — APTT: APTT: 28 s (ref 24–37)

## 2014-08-17 LAB — CBG MONITORING, ED
Glucose-Capillary: 84 mg/dL (ref 70–99)
Glucose-Capillary: 94 mg/dL (ref 70–99)

## 2014-08-17 MED ORDER — ASPIRIN 325 MG PO TABS
325.0000 mg | ORAL_TABLET | Freq: Every day | ORAL | Status: DC
  Administered 2014-08-18 – 2014-08-21 (×4): 325 mg via ORAL
  Filled 2014-08-17 (×4): qty 1

## 2014-08-17 NOTE — ED Notes (Signed)
Patient transported to MRI 

## 2014-08-17 NOTE — ED Notes (Signed)
Admitting physician at bedside

## 2014-08-17 NOTE — ED Provider Notes (Signed)
CSN: [id number]     Arrival date & time 08/17/14  1501 History   First MD Initiated Contact with Patient 08/17/14 1556     Chief Complaint  Patient presents with  . 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 These were located in the posterior occipital lobes and somewhat in the temporal lobe. [facility] when she was ambulating and had significant difficulty feeling as though she was given a fall, she required assistance, felt completely off balance, this is happened twice in the last week, once yesterday and once on Friday which was significant and severe lasting 5 minutes. The daughter-in-law who accompanies the patient reports that this was fairly severe and has been associated with some facial droop though she is unable to tell which side it was on at this time. Currently the patient reports no symptoms, states she is at her baseline and other than reporting intermittent chest pain over the last week she has no current concerns. She denies diarrhea dysuria fevers chills nausea vomiting, has occasional coughing and occasional chest pain, no swelling.  The history is provided by the patient.    Past Medical History  Diagnosis Date  . Hypertension   . Diabetes mellitus   . Dementia   . Alzheimer's disease   . Anemia of other chronic disease   . Chronic obstructive asthma, unspecified   . Esophageal reflux   . Unspecified hypothyroidism   . Nontoxic multinodular goiter   . Low  tension open-angle glaucoma(365.12)   . Unspecified vitamin D deficiency   . Psychotic depression   . Insomnia   . Drug induced akathisia   . Diabetic neuropathy   . Chronic kidney disease (CKD), stage III (moderate)   . Lichen planus    Past Surgical History  Procedure Laterality Date  . Abdominal hysterectomy  yrs ago    ovaries also  . Knee surgery Left yrs ago  . Partial thryoidectomy  yrs ago  . Esophagogastroduodenoscopy (egd) with propofol N/A 03/31/2014    Procedure: ESOPHAGOGASTRODUODENOSCOPY (EGD) WITH PROPOFOL;  Surgeon: Rachael Fee, MD;  Location: [facility];  Service: Endoscopy;  Laterality: N/A;   Family History  Problem Relation Age of Onset  . Cancer Brother     lung  . Cancer Sister     ?  Marland Kitchen Breast cancer Sister     x2  . Heart attack Sister    History  Substance Use Topics  . Smoking status: Never Smoker   . Smokeless tobacco: Never Used  . Alcohol Use: No   OB History    No data available     Review of Systems  Unable to perform ROS: Dementia      Allergies  Review of patient's allergies indicates no known allergies.  Home Medications   Prior to Admission medications   Medication Sig Start Date End Date Taking? Authorizing Provider  amLODipine (NORVASC) 10 MG tablet Take 10 mg by mouth daily.   Yes Historical Provider, MD  donepezil (ARICEPT) 10 MG tablet Take 10 mg by mouth at bedtime  as needed.   Yes Historical Provider, MD  escitalopram (LEXAPRO) 20 MG tablet Take 20 mg by mouth daily.   Yes Historical Provider, MD  levothyroxine (SYNTHROID, LEVOTHROID) 25 MCG tablet Take 25 mcg by mouth daily.   Yes Historical Provider, MD  lisinopril (PRINIVIL,ZESTRIL) 20 MG tablet Take 20 mg by mouth daily.   Yes Historical Provider, MD  Loperamide HCl (ANTI-DIARRHEAL PO) Take 2 tablets by mouth daily as needed. For diarrheal symptoms.   Yes Historical Provider, MD  mirtazapine (REMERON) 30 MG tablet Take 30 mg by mouth at bedtime.   Yes  Historical Provider, MD  pantoprazole (PROTONIX) 40 MG tablet Take 1 tablet (40 mg total) by mouth 2 (two) times daily before a meal. 03/02/14  Yes Rachael Fee, MD  pravastatin (PRAVACHOL) 40 MG tablet Take 40 mg by mouth daily.   Yes Historical Provider, MD  risperiDONE (RISPERDAL M-TABS) 2 MG disintegrating tablet Take 2 mg by mouth 2 (two) times daily.   Yes Historical Provider, MD  zolpidem (AMBIEN) 5 MG tablet Take 5 mg by mouth at bedtime as needed.   Yes Historical Provider, MD   BP 153/75 mmHg  Pulse 96  Temp(Src) 98.5 F (36.9 C) (Oral)  Resp 16  SpO2 97% Physical Exam  Constitutional: She appears well-developed and well-nourished. No distress.  HENT:  Head: Normocephalic and atraumatic.  Mouth/Throat: Oropharynx is clear and moist. No oropharyngeal exudate.  Eyes: Conjunctivae and EOM are normal. Pupils are equal, round, and reactive to light. Right eye exhibits no discharge. Left eye exhibits no discharge. No scleral icterus.  Neck: Normal range of motion. Neck supple. No JVD present. No thyromegaly present.  Cardiovascular: Normal rate, regular rhythm, normal heart sounds and intact distal pulses.  Exam reveals no gallop and no friction rub.   No murmur heard. Pulmonary/Chest: Effort normal and breath sounds normal. No respiratory distress. She has no wheezes. She has no rales.  Abdominal: Soft. Bowel sounds are normal. She exhibits no distension and no mass. There is no tenderness.  Musculoskeletal: Normal range of motion. She exhibits no edema or tenderness.  Lymphadenopathy:    She has no cervical adenopathy.  Neurological: She is alert. Coordination normal.  Frequent small abnormal movements of the arms and the legs, follows commands without difficulty, straight leg raise bilaterally 5 out of 5, cranial nerves III through XII normal, normal grips, no pronator drift  Skin: Skin is warm and dry. No rash noted. No erythema.  Psychiatric: She has a normal mood and affect.  Her behavior is normal.  Nursing note and vitals reviewed.   ED Course  Procedures (including critical care time) Labs Review Labs Reviewed  CBC - Abnormal; Notable for the following:    RBC 3.60 (*)    Hemoglobin 10.9 (*)    HCT 32.2 (*)    All other components within normal limits  COMPREHENSIVE METABOLIC PANEL - Abnormal; Notable for the following:    Creatinine, Ser 1.44 (*)    GFR calc non Af Amer 35 (*)    GFR calc Af Amer 41 (*)    All other components within normal limits  URINALYSIS, ROUTINE W REFLEX MICROSCOPIC - Abnormal; Notable for the following:    Leukocytes, UA SMALL (*)    All other components within normal limits  CK - Abnormal; Notable for the following:    Total CK 197 (*)    All other components within normal limits  URINE MICROSCOPIC-ADD ON - Abnormal; Notable for the following:  Squamous Epithelial / LPF FEW (*)    All other components within normal limits  I-STAT CHEM 8, ED - Abnormal; Notable for the following:    Sodium 146 (*)    Potassium 3.3 (*)    Creatinine, Ser 1.40 (*)    Calcium, Ion 1.32 (*)    Hemoglobin 11.6 (*)    HCT 34.0 (*)    All other components within normal limits  ETHANOL  PROTIME-INR  APTT  DIFFERENTIAL  URINE RAPID DRUG SCREEN (HOSP PERFORMED)  I-STAT TROPOININ, ED  I-STAT TROPOININ, ED  CBG MONITORING, ED    Imaging Review Ct Head Wo Contrast  08/17/2014   CLINICAL DATA:  74 year old female with a history of transient ischemic attack.  EXAM: CT HEAD WITHOUT CONTRAST  TECHNIQUE: Contiguous axial images were obtained from the base of the skull through the vertex without intravenous contrast.  COMPARISON:  MR brain 05/09/2014  FINDINGS: Unremarkable appearance of the calvarium without acute fracture or aggressive lesion.  Unremarkable appearance of the scalp soft tissues.  Unremarkable appearance of the bilateral orbits.  Mastoid air cells are clear.  No significant paranasal sinus disease  No acute intracranial  hemorrhage.  No midline shift or mass effect.  Re- demonstration of encephalomalacia changes of bilateral occipital region, present on prior MR. Confluent hypodensity in the periventricular white matter, similar distribution to the prior MRI.  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 This has not significantly changed in size since the MR.  Gray-white differentiation maintained.  Intracranial atherosclerotic calcifications of the anterior circulation.  IMPRESSION: No CT evidence of acute intracranial abnormality.  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  Periventricular white matter disease, compatible with small vessel disease. Intracranial atherosclerosis.  Signed,  Yvone NeuJaime S. Loreta AveWagner, DO  [facility]  [facility]   Electronically Signed   By: Gilmer MorJaime  Wagner D.O.   On: 08/17/2014 20:00     EKG Interpretation   Date/Time:  Wednesday August 17 2014 15:21:44 EDT Ventricular Rate:  92 PR Interval:  135 QRS Duration: 75 QT Interval:  398 QTC Calculation: 492 R Axis:   -17 Text Interpretation:  Sinus rhythm Ventricular premature complex  Borderline left axis deviation Borderline prolonged QT interval No old  tracing to compare Confirmed by Ethelda ChickJACUBOWITZ  MD, SAM [phone]([id number]) on 08/17/2014  4:22:43 PM      MDM   Final diagnoses:  Other specified transient cerebral ischemias    The patient has slight hypertension, EKG showed no specific abnormal findings, possibly TIA given the waxing and waning symptoms associated with facial droop, has had recent complete workup, will discussed with neuro hospitalist  The patient will need to be admitted to the hospital per the neurologist, discussed with the internal medicine resident, Dr. Andrey CampanileWilson, she will come to admit. CT scan reveals no signs of acute hemorrhage or acute stroke.  Eber HongBrian Qunisha Bryk, MD 08/17/14 2131

## 2014-08-17 NOTE — H&P (Signed)
Date: 08/17/2014               Patient Name:  Norma Neal MRN: [id number]  DOB: January 31, 1941 Age / Sex: 74 y.o., female   PCP: Jethro Bastos, MD              Medical Service: Internal Medicine Teaching Service              Attending Physician: Dr. Eber Hong, MD    First Contact: Dr. Valentino Saxon Pager: [phone]  Second Contact: Dr. Delane Ginger Pager: [phone]            After Hours (After 5p/  First Contact Pager: [phone]  weekends / holidays): Second Contact Pager: [phone]   Chief Complaint:  Difficulty walking and slurred speech.  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 clinic with transient episodes of gait instability and slurred speech. She reports being at her doctor's office today when she suddenly had difficulty ambulating and felt as if she was going to fall. She says that she felt as if her legs were both weak, and she was unsteady on her feet. She reports 2 similar episodes over the last week (yesterday and Friday). Her episode today was associated with some slurred speech and lasted approximately 5 minutes and completely resolved. Her daughter-in-law reports that her ataxia was associated with a facial droop, but she is unsure which side it was on. The patient also reports intermittent chest pain over the last week. She has had an episode last night that made it difficult for her to sleep. Her chest pain resolved after taking antacids, and she does report a history of GERD.  She also reports a bilateral frontal headache currently.  She has a history of abnormal gait which was evaluated by brain MRI in December 2015, which showed no acute infarct but remote bilateral occipital lobe, frontal, and bilateral basal ganglia infarcts.   In the ER, the patient was  intermittently tachycardic to 131 and neurology was consulted and recommended full TIA workup.  Review of Systems: Review of Systems  Constitutional: Negative for fever, chills and diaphoresis.  HENT: Negative for congestion and sore throat.   Eyes: Negative for blurred vision.  Respiratory: Negative for cough, shortness of breath and wheezing.   Cardiovascular: Positive for chest pain (Intermittent, none currently.). Negative for palpitations, orthopnea and leg swelling.  Gastrointestinal: Negative for nausea, vomiting, diarrhea and constipation.  Genitourinary: Negative for dysuria and urgency.  Musculoskeletal: Negative for myalgias, back pain and joint pain.  Skin: Negative for rash.  Neurological: Positive for headaches. Negative for dizziness, tingling, sensory change, focal weakness, seizures and weakness.    Meds:  (Not in a hospital admission) No current facility-administered medications for this encounter.   Current Outpatient Prescriptions  Medication Sig Dispense Refill  . amLODipine (NORVASC) 10 MG tablet Take 10 mg by mouth daily.    Marland Kitchen donepezil (ARICEPT) 10 MG tablet Take 10 mg by mouth at bedtime as needed.    Marland Kitchen escitalopram (LEXAPRO) 20 MG tablet Take 20 mg by mouth daily.    Marland Kitchen levothyroxine (SYNTHROID, LEVOTHROID) 25 MCG tablet Take 25 mcg by mouth daily.    Marland Kitchen lisinopril (PRINIVIL,ZESTRIL) 20 MG tablet Take 20 mg by mouth daily.    . Loperamide HCl (ANTI-DIARRHEAL PO) Take 2 tablets by mouth daily as  needed. For diarrheal symptoms.    . mirtazapine (REMERON) 30 MG tablet Take 30 mg by mouth at bedtime.    . pantoprazole (PROTONIX) 40 MG tablet Take 1 tablet (40 mg total) by mouth 2 (two) times daily before a meal. 60 tablet 11  . pravastatin (PRAVACHOL) 40 MG tablet Take 40 mg by mouth daily.    . risperiDONE (RISPERDAL M-TABS) 2 MG disintegrating tablet Take 2 mg by mouth 2 (two) times daily.    Marland Kitchen zolpidem (AMBIEN) 5 MG tablet Take 5 mg by mouth at bedtime as  needed.      Allergies: Allergies as of 08/17/2014  . (No Known Allergies)   Past Medical History  Diagnosis Date  . Hypertension   . Diabetes mellitus   . Dementia   . Alzheimer's disease   . Anemia of other chronic disease   . Chronic obstructive asthma, unspecified   . Esophageal reflux   . Unspecified hypothyroidism   . Nontoxic multinodular goiter   . 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 Unspecified vitamin D deficiency   . Psychotic depression   . Insomnia   . Drug induced akathisia   . Diabetic neuropathy   . Chronic kidney disease (CKD), stage III (moderate)   . Lichen planus    Past Surgical History  Procedure Laterality Date  . Abdominal hysterectomy  yrs ago    ovaries also  . Knee surgery Left yrs ago  . Partial thryoidectomy  yrs ago  . Esophagogastroduodenoscopy (egd) with propofol N/A 03/31/2014    Procedure: ESOPHAGOGASTRODUODENOSCOPY (EGD) WITH PROPOFOL;  Surgeon: Rachael Fee, MD;  Location: [facility];  Service: Endoscopy;  Laterality: N/A;   Family History  Problem Relation Age of Onset  . Cancer Brother     lung  . Cancer Sister     ?  Marland Kitchen Breast cancer Sister     x2  . Heart attack Sister    History   Social History  . Marital Status: Widowed    Spouse Name: N/A  . Number of Children: N/A  . Years of Education: N/A   Occupational History  . Not on file.   Social History Main Topics  . Smoking status: Never Smoker   . Smokeless tobacco: Never Used  . Alcohol Use: No  . Drug Use: No  . Sexual Activity: No   Other Topics Concern  . Not on file   Social History Narrative    Physical Exam: Filed Vitals:   08/17/14 2130  BP: 153/90  Pulse: 47  Temp: 98.5  Resp: 19   Physical Exam  Constitutional: She is oriented to person, place, and time and well-developed, well-nourished, and in no distress. No distress.  Involuntary movements of limbs and eye blinking.  HENT:  Head: Normocephalic and atraumatic.  Eyes:  Conjunctivae and EOM are normal. Pupils are equal, round, and reactive to light. No scleral icterus.  Confrontational visual fields intact.  Neck: Normal range of motion. Neck supple.  Cardiovascular: Normal heart sounds and intact distal pulses.   Irregularly irregular.  Pulmonary/Chest: Effort normal and breath sounds normal. No respiratory distress. She has no wheezes.  Abdominal: Soft. Bowel sounds are normal. She exhibits no distension. There is no tenderness.  Musculoskeletal: Normal range of motion. She exhibits no edema or tenderness.  Neurological: She is alert and oriented to person, place, and time. No cranial nerve deficit. She exhibits normal muscle tone.  4/5 strength in left arm, 4+/5 strength in left leg, 5  out of 5 strength elsewhere.  Sensation to light touch intact.  No pronator drift, finger-to-nose normal.  Skin: Skin is warm and dry. No rash noted. She is not diaphoretic. No erythema.  Psychiatric: Affect normal.    Lab results: Basic Metabolic Panel:  Recent Labs  04/54/0904/20/16 1714 08/17/14 1741  NA 145 146*  K 3.5 3.3*  CL 111 111  CO2 24  --   GLUCOSE 92 89  BUN 18 19  CREATININE 1.44* 1.40*  CALCIUM 9.9  --    Liver Function Tests:  Recent Labs  08/17/14 1714  AST 21  ALT 11  ALKPHOS 69  BILITOT 0.7  PROT 6.6  ALBUMIN 3.8   CBC:  Recent Labs  08/17/14 1714 08/17/14 1741  WBC 5.3  --   NEUTROABS 3.5  --   HGB 10.9* 11.6*  HCT 32.2* 34.0*  MCV 89.4  --   PLT 179  --    Cardiac Enzymes:  Recent Labs  08/17/14 1714  CKTOTAL 197*   CBG:  Recent Labs  08/17/14 1713  GLUCAP 84   Coagulation:  Recent Labs  08/17/14 1714  LABPROT 11.9  INR 0.87   Urine Drug Screen: Drugs of Abuse     Component Value Date/Time   LABOPIA NONE DETECTED 08/17/2014 1710   COCAINSCRNUR NONE DETECTED 08/17/2014 1710   LABBENZ NONE DETECTED 08/17/2014 1710   AMPHETMU NONE DETECTED 08/17/2014 1710   THCU NONE DETECTED 08/17/2014 1710   LABBARB  NONE DETECTED 08/17/2014 1710    Alcohol Level:  Recent Labs  08/17/14 1714  ETH <5   Urinalysis:  Recent Labs  08/17/14 1710  COLORURINE YELLOW  LABSPEC 1.005  PHURINE 6.5  GLUCOSEU NEGATIVE  HGBUR NEGATIVE  BILIRUBINUR NEGATIVE  KETONESUR NEGATIVE  PROTEINUR NEGATIVE  UROBILINOGEN 0.2  NITRITE NEGATIVE  LEUKOCYTESUR SMALL*  Few squamous epithelial cells, 0-2 white blood cells, rare bacteria.  Imaging results:  Ct Head Wo Contrast  08/17/2014   CLINICAL DATA:  74 year old female with a history of transient ischemic attack.  EXAM: CT HEAD WITHOUT CONTRAST  TECHNIQUE: Contiguous axial images were obtained from the base of the skull through the vertex without intravenous contrast.  COMPARISON:  MR brain 05/09/2014  FINDINGS: Unremarkable appearance of the calvarium without acute fracture or aggressive lesion.  Unremarkable appearance of the scalp soft tissues.  Unremarkable appearance of the bilateral orbits.  Mastoid air cells are clear.  No significant paranasal sinus disease  No acute intracranial hemorrhage.  No midline shift or mass effect.  Re- demonstration of encephalomalacia changes of bilateral occipital region, present on prior MR. Confluent hypodensity in the periventricular white matter, similar distribution to the prior MRI.  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 This has not significantly changed in size since the MR.  Gray-white differentiation maintained.  Intracranial atherosclerotic calcifications of the anterior circulation.  IMPRESSION: No CT evidence of acute intracranial abnormality.  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  Periventricular white matter disease, compatible with small vessel disease. Intracranial atherosclerosis.  Signed,  Yvone NeuJaime S. Loreta AveWagner, DO  [facility]  [facility]   Electronically Signed   By: Gilmer MorJaime  Wagner D.O.   On:  08/17/2014 20:00    Other results: EKG: Normal sinus rhythm with premature ventricular complex, left axis deviation.  Assessment & Plan by Problem: Active Problems:   TIA (transient ischemic attack)   #Transient ischemic attack Previous CVAs noted on  MRI late last year. Some left-sided weakness on exam, but this may be chronic from previous strokes. Symptoms most consistent with a transient ischemic attack. She has risk factors given her previous stroke and type 2 diabetes. Seen by neurology who recommends a full stroke workup. -Admit to telemetry for cardiac monitoring. CK slightly elevated, but no other reported a history consistent with seizure. -MRI/MRA without contrast. -Check lipid panel. -Echocardiogram -Carotid Dopplers. -Aspirin 325 mg daily. -Consult speech. -Nothing by mouth until nurse swallow screen. -Consult PT and OT. -Neurochecks.  #Type 2 diabetes Glucose normal on presentation. No hemoglobin A1c in our system. Reported history of neuropathy and nephropathy. Not on any medications currently. -Check hemoglobin A1c. -CBGs before meals and at bedtime.  #Chronic kidney disease Creatinine down from 2.3 3 years ago. -Trend creatinine.  #Anemia Hemoglobin 10.9 up from 8.8 2 years ago. No prior anemia panel in our system. -Trend hemoglobin. -Check anemia panel.  #Hypertension She is on amlodipine 10 mg daily and lisinopril 20 mg daily at home. -Hold home hypertensive medications in setting of TIA.  #Hypothyroidism -Continue home Synthroid 25 g daily.  #GERD -Continue home Protonix 40 mg twice a day.  #Hyperlipidemia CK slightly elevated. -Hold home pravastatin 40 mg daily for now.  #Alzheimer's disease/depression On risperidone 2 mg twice a day with history of tardive dyskinesia that is persistent.  Also takes mirtazapine, zolpidem, Ambien, and donepezil at home. -Continue home escitalopram 20 g daily. -Continue home Risperdal 2 mg twice a  day. -Continue home mirtazapine 30 mg daily at bedtime. -Hold home Ambien. -Hold home Aricept, which is written when necessary.  Dispo: Disposition is deferred at this time, awaiting improvement of current medical problems. Anticipated discharge in approximately 1-3 day(s).   The patient does have a current PCP Jethro Bastos, MD), therefore will not be require OPC follow-up after discharge.   The patient does have transportation limitations that hinder transportation to clinic appointments.   Signed:  Luisa Dago, MD, PhD PGY-1 Internal Medicine Teaching Service Pager: [phone] 08/17/2014, 10:03 PM

## 2014-08-17 NOTE — ED Notes (Signed)
Pt reports to the ED for eval of possible TIA. [facility] episode of ataxia and slurred speech. She also reported some dizziness and right sided weakness. Pt has hx of CVA. She reports she has been having these episodes since Friday. She reports they happen usually once a day. She denies any pain or dizziness at this time. No neuro deficits noted at this time. Pt A&Ox4, resp e/u, and skin warm and dry.

## 2014-08-17 NOTE — ED Notes (Signed)
Blood glucose 94

## 2014-08-17 NOTE — Consult Note (Addendum)
Stroke Consult    Chief Complaint: transient gait instability and slurred speech  HPI: Norma Neal is an 74 y.o. female hx of tardive dyskinesia presenting for evaluation of transient episodes of gait instability and slurred speech. In the past week has had 2 episodes of acute onset gait instability, sensation of feeling like she is falling. Lasted around 5 minutes each time. Also noted some slurred speech during these episodes along with question of facial droop (unclear which side). Currently returned to baseline.   Has history of abnormal gait which was evaluated with MRI brain in December 2015. 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 She is not on a home anti-platelet medication.   CT head pending.    Past Medical History  Diagnosis Date  . Hypertension   . Diabetes mellitus   . Dementia   . Alzheimer's disease   . Anemia of other chronic disease   . Chronic obstructive asthma, unspecified   . Esophageal reflux   . Unspecified hypothyroidism   . Nontoxic multinodular goiter   . 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 Unspecified vitamin D deficiency   . Psychotic depression   . Insomnia   . Drug induced akathisia   . Diabetic neuropathy   . Chronic kidney disease (CKD), stage III (moderate)   . Lichen planus     Past Surgical History  Procedure Laterality Date  . Abdominal hysterectomy  yrs ago    ovaries also  . Knee surgery Left yrs ago  . Partial thryoidectomy  yrs ago  . Esophagogastroduodenoscopy (egd) with propofol N/A 03/31/2014    Procedure: ESOPHAGOGASTRODUODENOSCOPY (EGD) WITH PROPOFOL;  Surgeon: Rachael Fee, MD;  Location: [facility];  Service: Endoscopy;  Laterality: N/A;    Family History  Problem Relation Age of Onset  . Cancer Brother     lung  . Cancer Sister     ?  Marland Kitchen Breast cancer Sister     x2  . Heart attack Sister    Social History:  reports  that she has never smoked. She has never used smokeless tobacco. She reports that she does not drink alcohol or use illicit drugs.  Allergies: No Known Allergies   (Not in a hospital admission)  ROS: Out of a complete 14 system review, the patient complains of only the following symptoms, and all other reviewed systems are negative. + gait instability   Physical Examination: Filed Vitals:   08/17/14 1615  BP: 151/128  Pulse: 71  Temp:   Resp: 25   Physical Exam  Constitutional: He appears well-developed and well-nourished.  Psych: Affect appropriate to situation Eyes: No scleral injection HENT: No OP obstrucion Head: Normocephalic.  Cardiovascular: Normal rate and regular rhythm.  Respiratory: Effort normal and breath sounds normal.  GI: Soft. Bowel sounds are normal. No distension. There is no tenderness.  Skin: WDI   Neurologic Examination: Mental Status: Alert, oriented, thought content appropriate.  Speech fluent without evidence of aphasia.  No dysarthria. Able to follow 3 step commands without difficulty. Cranial Nerves: II: funduscopic exam wnl bilaterally, R homonymous hemianopsia, pupils equal, round, reactive to light III,IV, VI: ptosis not present, extra-ocular motions intact bilaterally V,VII: smile symmetric, facial light touch sensation normal bilaterally VIII: hearing normal bilaterally IX,X: gag reflex present XI: trapezius strength/neck flexion strength normal bilaterally XII: tongue strength normal  Motor: orobuccolingual and extremity dyskinesias noted  Right : Upper extremity    Left:  Upper extremity 5/5 deltoid       5/5 deltoid 5/5 biceps      5/5 biceps  5/5 triceps      5/5 triceps 5/5 hand grip      5/5 hand grip  Lower extremity     Lower extremity 5/5 hip flexor      5/5 hip flexor 5/5 quadricep      5/5 quadriceps  5/5 hamstrings     5/5 hamstrings 5/5 plantar flexion       5/5 plantar flexion 5/5 plantar extension     5/5  plantar extension Tone and bulk:normal tone throughout; no atrophy noted Sensory: Pinprick and light touch intact throughout, bilaterally Deep Tendon Reflexes: 2+ and symmetric throughout Plantars: Right: downgoing   Left: downgoing Cerebellar: Dyskinesias with FTN and HTS bilaterally Gait: deferred  Laboratory Studies:   Basic Metabolic Panel: No results for input(s): NA, K, CL, CO2, GLUCOSE, BUN, CREATININE, CALCIUM, MG, PHOS in the last 168 hours.  Liver Function Tests: No results for input(s): AST, ALT, ALKPHOS, BILITOT, PROT, ALBUMIN in the last 168 hours. No results for input(s): LIPASE, AMYLASE in the last 168 hours. No results for input(s): AMMONIA in the last 168 hours.  CBC: No results for input(s): WBC, NEUTROABS, HGB, HCT, MCV, PLT in the last 168 hours.  Cardiac Enzymes: No results for input(s): CKTOTAL, CKMB, CKMBINDEX, TROPONINI in the last 168 hours.  BNP: Invalid input(s): POCBNP  CBG: No results for input(s): GLUCAP in the last 168 hours.  Microbiology: No results found for this or any previous visit.  Coagulation Studies: No results for input(s): LABPROT, INR in the last 72 hours.  Urinalysis: No results for input(s): COLORURINE, LABSPEC, PHURINE, GLUCOSEU, HGBUR, BILIRUBINUR, KETONESUR, PROTEINUR, UROBILINOGEN, NITRITE, LEUKOCYTESUR in the last 168 hours.  Invalid input(s): APPERANCEUR  Lipid Panel:  No results found for: CHOL, TRIG, HDL, CHOLHDL, VLDL, LDLCALC  HgbA1C: No results found for: HGBA1C  Urine Drug Screen:  No results found for: LABOPIA, COCAINSCRNUR, LABBENZ, AMPHETMU, THCU, LABBARB  Alcohol Level: No results for input(s): ETH in the last 168 hours.  Other results: EKG: normal EKG, normal sinus rhythm.  Imaging: No results found.  Assessment: 74 y.o. female hx of HTN, prior CVA, tardive dyskinesia presenting for evaluation of 2 transient episodes of gait instability, slurred speech and question of facial droop. Currently back to  baseline. MRI brain from December 2015 showed multiple remote infarcts. Patient is not on a daily antiplatelet therapy. Due to history of multiple strokes in the past would benefit from admission for TIA workup.   Plan: 1. HgbA1c, fasting lipid panel 2. MRI, MRA  of the brain without contrast 3. PT consult, OT consult, Speech consult 4. Echocardiogram 5. Carotid dopplers 6. Prophylactic therapy-ASA 325mg  daily 7. Risk factor modification 8. Telemetry monitoring 9. Frequent neuro checks 10. NPO until RN stroke swallow screen   Elspeth Choeter Ariyan Brisendine, DO [facility] [phone]  If 7pm- 7am, please page neurology on call as listed in AMION. 08/17/2014, 4:44 PM

## 2014-08-18 DIAGNOSIS — G309 Alzheimer's disease, unspecified: Secondary | ICD-10-CM | POA: Diagnosis not present

## 2014-08-18 DIAGNOSIS — E1122 Type 2 diabetes mellitus with diabetic chronic kidney disease: Secondary | ICD-10-CM | POA: Diagnosis not present

## 2014-08-18 DIAGNOSIS — G459 Transient cerebral ischemic attack, unspecified: Secondary | ICD-10-CM | POA: Diagnosis not present

## 2014-08-18 DIAGNOSIS — E1121 Type 2 diabetes mellitus with diabetic nephropathy: Secondary | ICD-10-CM | POA: Diagnosis not present

## 2014-08-18 DIAGNOSIS — G458 Other transient cerebral ischemic attacks and related syndromes: Secondary | ICD-10-CM | POA: Diagnosis not present

## 2014-08-18 DIAGNOSIS — K219 Gastro-esophageal reflux disease without esophagitis: Secondary | ICD-10-CM

## 2014-08-18 DIAGNOSIS — R270 Ataxia, unspecified: Secondary | ICD-10-CM | POA: Diagnosis not present

## 2014-08-18 DIAGNOSIS — E039 Hypothyroidism, unspecified: Secondary | ICD-10-CM | POA: Insufficient documentation

## 2014-08-18 DIAGNOSIS — E785 Hyperlipidemia, unspecified: Secondary | ICD-10-CM

## 2014-08-18 DIAGNOSIS — F0280 Dementia in other diseases classified elsewhere without behavioral disturbance: Secondary | ICD-10-CM

## 2014-08-18 DIAGNOSIS — N183 Chronic kidney disease, stage 3 unspecified: Secondary | ICD-10-CM | POA: Insufficient documentation

## 2014-08-18 DIAGNOSIS — N189 Chronic kidney disease, unspecified: Secondary | ICD-10-CM

## 2014-08-18 DIAGNOSIS — I129 Hypertensive chronic kidney disease with stage 1 through stage 4 chronic kidney disease, or unspecified chronic kidney disease: Secondary | ICD-10-CM | POA: Diagnosis not present

## 2014-08-18 DIAGNOSIS — D649 Anemia, unspecified: Secondary | ICD-10-CM

## 2014-08-18 DIAGNOSIS — E1140 Type 2 diabetes mellitus with diabetic neuropathy, unspecified: Secondary | ICD-10-CM | POA: Diagnosis not present

## 2014-08-18 DIAGNOSIS — I951 Orthostatic hypotension: Secondary | ICD-10-CM | POA: Diagnosis not present

## 2014-08-18 LAB — LIPID PANEL
CHOL/HDL RATIO: 2.8 ratio
Cholesterol: 208 mg/dL — ABNORMAL HIGH (ref 0–200)
HDL: 75 mg/dL (ref >39)
LDL CALC: 113 mg/dL — AB (ref 0–99)
TRIGLYCERIDES: 100 mg/dL (ref <150)
VLDL: 20 mg/dL (ref 0–40)

## 2014-08-18 LAB — BASIC METABOLIC PANEL
Anion gap: 9 (ref 5–15)
BUN: 17 mg/dL (ref 6–23)
CALCIUM: 9.2 mg/dL (ref 8.4–10.5)
CHLORIDE: 113 mmol/L — AB (ref 96–112)
CO2: 24 mmol/L (ref 19–32)
CREATININE: 1.4 mg/dL — AB (ref 0.50–1.10)
GFR calc non Af Amer: 36 mL/min — ABNORMAL LOW (ref >90)
GFR, EST AFRICAN AMERICAN: 42 mL/min — AB (ref >90)
Glucose, Bld: 92 mg/dL (ref 70–99)
Potassium: 3.3 mmol/L — ABNORMAL LOW (ref 3.5–5.1)
Sodium: 146 mmol/L — ABNORMAL HIGH (ref 135–145)

## 2014-08-18 LAB — GLUCOSE, CAPILLARY
Glucose-Capillary: 85 mg/dL (ref 70–99)
Glucose-Capillary: 104 mg/dL — ABNORMAL HIGH (ref 70–99)
Glucose-Capillary: 122 mg/dL — ABNORMAL HIGH (ref 70–99)
Glucose-Capillary: 96 mg/dL (ref 70–99)

## 2014-08-18 LAB — CBC
HCT: 30.2 % — ABNORMAL LOW (ref 36.0–46.0)
HEMOGLOBIN: 10.3 g/dL — AB (ref 12.0–15.0)
MCH: 30.7 pg (ref 26.0–34.0)
MCHC: 34.1 g/dL (ref 30.0–36.0)
MCV: 90.1 fL (ref 78.0–100.0)
Platelets: 165 10*3/uL (ref 150–400)
RBC: 3.35 MIL/uL — AB (ref 3.87–5.11)
RDW: 14.1 % (ref 11.5–15.5)
WBC: 4.5 10*3/uL (ref 4.0–10.5)

## 2014-08-18 LAB — RETICULOCYTES
RBC.: 3.33 MIL/uL — ABNORMAL LOW (ref 3.87–5.11)
RETIC CT PCT: 1.5 % (ref 0.4–3.1)
Retic Count, Absolute: 50 10*3/uL (ref 19.0–186.0)

## 2014-08-18 LAB — TROPONIN I
Troponin I: <0.03 ng/mL (ref <0.031)
Troponin I: <0.03 ng/mL (ref <0.031)

## 2014-08-18 LAB — IRON AND TIBC
IRON: 38 ug/dL — AB (ref 42–145)
Saturation Ratios: 14 % — ABNORMAL LOW (ref 20–55)
TIBC: 270 ug/dL (ref 250–470)
UIBC: 232 ug/dL (ref 125–400)

## 2014-08-18 LAB — FOLATE: Folate: 10.6 ng/mL

## 2014-08-18 LAB — CK: CK TOTAL: 154 U/L (ref 7–177)

## 2014-08-18 LAB — TSH: TSH: 1.459 u[IU]/mL (ref 0.350–4.500)

## 2014-08-18 LAB — FERRITIN: FERRITIN: 24 ng/mL (ref 10–291)

## 2014-08-18 LAB — VITAMIN B12: Vitamin B-12: 299 pg/mL (ref 211–911)

## 2014-08-18 MED ORDER — POTASSIUM CHLORIDE CRYS ER 20 MEQ PO TBCR
40.0000 meq | EXTENDED_RELEASE_TABLET | Freq: Once | ORAL | Status: AC
Start: 2014-08-18 — End: 2014-08-18
  Administered 2014-08-18: 40 meq via ORAL
  Filled 2014-08-18: qty 2

## 2014-08-18 MED ORDER — SODIUM CHLORIDE 0.9 % IV SOLN
INTRAVENOUS | Status: AC
  Administered 2014-08-18 (×2): via INTRAVENOUS

## 2014-08-18 MED ORDER — LEVOTHYROXINE SODIUM 25 MCG PO TABS
25.0000 ug | ORAL_TABLET | Freq: Every day | ORAL | Status: DC
  Administered 2014-08-18 – 2014-08-21 (×4): 25 ug via ORAL
  Filled 2014-08-18 (×5): qty 1

## 2014-08-18 MED ORDER — PRAVASTATIN SODIUM 40 MG PO TABS
40.0000 mg | ORAL_TABLET | Freq: Every day | ORAL | Status: DC
  Administered 2014-08-18 – 2014-08-20 (×3): 40 mg via ORAL
  Filled 2014-08-18 (×3): qty 1

## 2014-08-18 MED ORDER — RISPERIDONE 2 MG PO TBDP
2.0000 mg | ORAL_TABLET | Freq: Two times a day (BID) | ORAL | Status: DC
  Administered 2014-08-18 – 2014-08-19 (×4): 2 mg via ORAL
  Filled 2014-08-18 (×5): qty 1

## 2014-08-18 MED ORDER — SODIUM CHLORIDE 0.9 % IV SOLN
INTRAVENOUS | Status: AC
  Administered 2014-08-18: 02:00:00 via INTRAVENOUS

## 2014-08-18 MED ORDER — ESCITALOPRAM OXALATE 10 MG PO TABS
20.0000 mg | ORAL_TABLET | Freq: Every day | ORAL | Status: DC
  Administered 2014-08-18 – 2014-08-21 (×4): 20 mg via ORAL
  Filled 2014-08-18 (×4): qty 2

## 2014-08-18 MED ORDER — PANTOPRAZOLE SODIUM 40 MG PO TBEC
40.0000 mg | DELAYED_RELEASE_TABLET | Freq: Two times a day (BID) | ORAL | Status: DC
  Administered 2014-08-18 – 2014-08-21 (×7): 40 mg via ORAL
  Filled 2014-08-18 (×7): qty 1

## 2014-08-18 MED ORDER — SODIUM CHLORIDE 0.9 % IV BOLUS (SEPSIS)
500.0000 mL | Freq: Once | INTRAVENOUS | Status: AC
  Administered 2014-08-18: 500 mL via INTRAVENOUS

## 2014-08-18 MED ORDER — STROKE: EARLY STAGES OF RECOVERY BOOK
Freq: Once | Status: DC
  Filled 2014-08-18: qty 1

## 2014-08-18 MED ORDER — HEPARIN SODIUM (PORCINE) 5000 UNIT/ML IJ SOLN
5000.0000 [IU] | Freq: Three times a day (TID) | INTRAMUSCULAR | Status: DC
  Administered 2014-08-18 – 2014-08-21 (×11): 5000 [IU] via SUBCUTANEOUS
  Filled 2014-08-18 (×11): qty 1

## 2014-08-18 MED ORDER — MIRTAZAPINE 15 MG PO TABS
30.0000 mg | ORAL_TABLET | Freq: Every day | ORAL | Status: DC
  Administered 2014-08-18 (×2): 30 mg via ORAL
  Filled 2014-08-18 (×2): qty 2

## 2014-08-18 NOTE — Evaluation (Signed)
Physical Therapy Evaluation Patient Details Name: Norma Neal MRN: [id number] DOB: 07/18/1940 Today's Date: 08/18/2014   History of Present Illness  74 y.o. female extensive medical hx including Alzheimer's, DM, htn, glaucoma, psychotic depression, CKD and hx of tardive dyskinesia presenting for evaluation of transient episodes of gait instability and slurred speech. Neuro work up underway.  Clinical Impression  Patient demonstrates deficits in functional mobility as indicated below. Will need continued skilled PT to address deficits and maximize function. Will see as indicated and progress as tolerated. Recommend HHPT and 24/7 supervision upon discharge. OF NOTE: Pace caregiver present during evaluation, states that patient is well known to her and that patient is far from mobility baseline.     Follow Up Recommendations Home health PT;Supervision/Assistance - 24 hour    Equipment Recommendations  None recommended by PT    Recommendations for Other Services       Precautions / Restrictions Precautions Precautions: Fall Restrictions Weight Bearing Restrictions: No      Mobility  Bed Mobility Overal bed mobility: Modified Independent             General bed mobility comments: increased time to perform  Transfers Overall transfer level: Needs assistance Equipment used: 1 person hand held assist Transfers: Sit to/from Stand Sit to Stand: Min assist         General transfer comment: min assist for stability  Ambulation/Gait Ambulation/Gait assistance: Min assist Ambulation Distance (Feet): 70 Feet (15 ft with HHA (very unsteady)) Assistive device: Rolling walker (2 wheeled);1 person hand held assist Gait Pattern/deviations: Step-through pattern;Decreased stride length;Narrow base of support;Drifts right/left Gait velocity: very slow (caregiver reports very different from baseline) Gait velocity interpretation: <1.8 ft/sec, indicative of risk for recurrent  falls General Gait Details: Max cues to increase cadence, patient very slow and guarded, provided patient with RW for ambulation, patient with improved stability using RW but continues to demonstrate decreased cadence.  Stairs            Wheelchair Mobility    Modified Rankin (Stroke Patients Only) Modified Rankin (Stroke Patients Only) Pre-Morbid Rankin Score: Moderate disability Modified Rankin: Moderately severe disability     Balance Overall balance assessment: Needs assistance   Sitting balance-Leahy Scale: Poor       Standing balance-Leahy Scale: Poor Standing balance comment: instability noted with static standing, required HHA                             Pertinent Vitals/Pain Pain Assessment: No/denies pain    Home Living Family/patient expects to be discharged to:: Private residence Living Arrangements: Children Available Help at Discharge: Family Type of Home: House Home Access: Stairs to enter Entrance Stairs-Rails: Right;Left   Home Layout: Two level Home Equipment: Environmental consultantWalker - 2 wheels;Walker - 4 wheels      Prior Function Level of Independence: Independent with assistive device(s)         Comments: uses RW in communication     Hand Dominance   Dominant Hand: Right    Extremity/Trunk Assessment               Lower Extremity Assessment: LLE deficits/detail   LLE Deficits / Details: asymetrical weakness evident. 3+/5 gross movements upon testing.  Cervical / Trunk Assessment: Kyphotic  Communication   Communication: HOH  Cognition Arousal/Alertness: Awake/alert  General Comments      Exercises        Assessment/Plan    PT Assessment Patient needs continued PT services  PT Diagnosis Difficulty walking;Abnormality of gait   PT Problem List Decreased strength;Decreased range of motion;Decreased activity tolerance;Decreased balance;Decreased mobility;Decreased coordination  PT  Treatment Interventions DME instruction;Gait training;Stair training;Therapeutic activities;Functional mobility training;Therapeutic exercise;Balance training;Patient/family education   PT Goals (Current goals can be found in the Care Plan section) Acute Rehab PT Goals Patient Stated Goal: none stated PT Goal Formulation: With patient Time For Goal Achievement: 09/01/14 Potential to Achieve Goals: Fair    Frequency Min 3X/week   Barriers to discharge Decreased caregiver support family works    Co-evaluation               End of Session Equipment Utilized During Treatment: Gait belt Activity Tolerance: Patient tolerated treatment well Patient left: in bed;with call bell/phone within reach;with bed alarm set;with family/visitor present Nurse Communication: Mobility status         Time: 1004-1029 PT Time Calculation (min) (ACUTE ONLY): 25 min (MD present for portion of assessment 5 minutes)   Charges:   PT Evaluation $Initial PT Evaluation Tier I: 1 Procedure     PT G CodesFabio Asa 2014-09-09, 10:39 AM  Charlotte Crumb, PT DPT  [phone]

## 2014-08-18 NOTE — Progress Notes (Signed)
UR completed 

## 2014-08-18 NOTE — Progress Notes (Signed)
STROKE TEAM PROGRESS NOTE   HISTORY Norma Neal is an 74 y.o. female hx of tardive dyskinesia presenting for evaluation of transient episodes of gait instability and slurred speech. In the past week has had 2 episodes of acute onset gait instability, sensation of feeling like she is falling. Lasted around 5 minutes each time. Also noted some slurred speech during these episodes along with question of facial droop (unclear which side). Currently returned to baseline.   Has history of abnormal gait which was evaluated with MRI brain in December 2015. 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 She is not on a home anti-platelet medication.   Patient was not administered TPA secondary to resolution of symptoms. She was admitted for further evaluation and treatment.   SUBJECTIVE (INTERVAL HISTORY) No family is at the bedside.  Overall she feels her condition is stable.    OBJECTIVE Temp:  [97.7 F (36.5 C)-98.7 F (37.1 C)] 98.7 F (37.1 C) (04/21 1000) Pulse Rate:  [44-131] 80 (04/21 1000) Cardiac Rhythm:  [-]  Resp:  [15-26] 18 (04/21 1000) BP: (96-179)/(62-128) 96/78 mmHg (04/21 1000) SpO2:  [85 %-100 %] 98 % (04/21 1000) Weight:  [54.795 kg (120 lb 12.8 oz)] 54.795 kg (120 lb 12.8 oz) (04/21 0120)   Recent Labs Lab 08/17/14 1713 08/17/14 2311 08/18/14 0643  GLUCAP 84 94 85    Recent Labs Lab 08/17/14 1714 08/17/14 1741 08/18/14 0610  NA 145 146* 146*  K 3.5 3.3* 3.3*  CL 111 111 113*  CO2 24  --  24  GLUCOSE 92 89 92  BUN CREATININE 1.44* 1.40* 1.40*  CALCIUM 9.9  --  9.2    Recent Labs Lab 08/17/14 1714  AST 21  ALT 11  ALKPHOS 69  BILITOT 0.7  PROT 6.6  ALBUMIN 3.8    Recent Labs Lab 08/17/14 1714 08/17/14 1741 08/18/14 0610  WBC 5.3  --  4.5  NEUTROABS 3.5  --   --   HGB 10.9* 11.6* 10.3*  HCT 32.2* 34.0* 30.2*  MCV 89.4  --  90.1  PLT 179  --  165     Recent Labs Lab 08/17/14 1714 08/18/14 0610  CKTOTAL 197* 154  TROPONINI  --  <0.03    Recent Labs  08/17/14 1714  LABPROT 11.9  INR 0.87    Recent Labs  08/17/14 1710  COLORURINE YELLOW  LABSPEC 1.005  PHURINE 6.5  GLUCOSEU NEGATIVE  HGBUR NEGATIVE  BILIRUBINUR NEGATIVE  KETONESUR NEGATIVE  PROTEINUR NEGATIVE  UROBILINOGEN 0.2  NITRITE NEGATIVE  LEUKOCYTESUR SMALL*       Component Value Date/Time   CHOL 208* 08/18/2014 0610   TRIG 100 08/18/2014 0610   HDL 75 08/18/2014 0610   CHOLHDL 2.8 08/18/2014 0610   VLDL 20 08/18/2014 0610   LDLCALC 113* 08/18/2014 0610   No results found for: HGBA1C    Component Value Date/Time   LABOPIA NONE DETECTED 08/17/2014 1710   COCAINSCRNUR NONE DETECTED 08/17/2014 1710   LABBENZ NONE DETECTED 08/17/2014 1710   AMPHETMU NONE DETECTED 08/17/2014 1710   THCU NONE DETECTED 08/17/2014 1710   LABBARB NONE DETECTED 08/17/2014 1710     Recent Labs Lab 08/17/14 1714  ETH <5    Ct Head Wo Contrast 08/17/2014    No CT evidence of acute intracranial abnormality.  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  Periventricular white  matter disease, compatible with small vessel disease. Intracranial atherosclerosis.    MRI HEAD 08/18/2014   No acute intracranial process, specifically no acute ischemia.  Remote bilateral posterior cerebral artery territory infarcts. LEFT frontoparietal encephalomalacia suggest remote LEFT middle cerebral artery territory infarct.  Moderate to severe chronic small vessel ischemic disease. Scattered micro hemorrhages likely reflect sequelae of chronic hypertension. Remote bilateral basal ganglia and RIGHT thalamus lacunar infarcts and or perivascular spaces.  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    MRA HEAD 08/18/2014   No large vessel occlusion. Multifocal stenosis of the anterior and posterior circulation.  Moderate luminal irregularity of the posterior cerebral arteries likely represents chronic small vessel ischemic disease.      PHYSICAL EXAM Pleasant  Frail elderly african american lady not in distress. . Afebrile. Head is nontraumatic. Neck is supple without bruit.    Cardiac exam no murmur or gallop. Lungs are clear to auscultation. Distal pulses are well felt. Neurological Exam : Awake alert oriented x 2 with impaired attention, registration and recall. normal speech and language. Mild left lower face asymmetry. Tongue midline. No drift. Mild diminished fine finger movements on left. Orbits right over left upper extremity. Mild left grip weak.. Normal sensation . Normal coordination. ASSESSMENT/PLAN Norma Neal is a 74 y.o. 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 She did not receive IV t-PA due to resolution of symptoms.   TIA vs recrudescence of previous old stroke symptoms in setting of medical illness  MRI  No acute stroke  MRA  No large vessel occlusion  Carotid Doppler  pending   2D Echo pending   Heparin 5000 units sq tid or VTE prophylaxis  Diet heart healthy/carb modified Room service appropriate?: Yes; Fluid consistency:: Thin  no antithrombotic prior to admission, now on aspirin 325 mg orally every day  Ongoing aggressive stroke risk factor management  Therapy recommendations:  Home health PT and OT  Disposition:  Anticipate return home  Hypertension  BP 96-179/71-90 past 24h (08/18/2014 @ 5:01 PM)   Hyperlipidemia  Home meds:  pravachol 40, resumed in hospital  LDL 113  Continue statin at  discharge  Diabetes type 2  HgbA1c pending  Other Stroke Risk Factors  Advanced age  Other Active Problems  CKD stage 3  Psychotic depression  Alzheimer's disease  Hospital day #   Rhoderick Moody [facility] See Amion for Pager information 08/18/2014 5:03 PM   I have personally examined this patient, reviewed notes, independently viewed imaging studies, participated in medical decision making and plan of care. I have made any additions or clarifications directly to the above note. Agree with note above. She presented with 2 brief TIA episodes and remains at risk for recurrent stroke, TIA and neurological worsening. She needs ongoing stroke evaluation and aggressive risk factor control. Delia Heady, MD Medical Director [facility] Pager: [phone] 08/18/2014 6:24 PM  ADDDENDUM ; after rounds at  approx 130 pm code stroke was called as patient found to be sleepy, left face droop and less responsive when ambulated to restroom after lunch. BP found to be low 100 range while it has been in 140 range earlier. She was seen and examined  and laid flat on bed and given 500 cc NS bolus and showed good improvement in mentation and left sided strength though left facial droop persisted. Code stroke was cancelled as she wa sfelt to be orthostatic with worseneing of old deficits and was rapidly improving. No repeat brain imaging was felt necessary. Primary team was notified. This patient is critically ill and at significant risk of neurological worsening,  and care requires constant monitoring of vital signs, hemodynamics,respiratory and cardiac monitoring,review of multiple databases, neurological assessment, discussion with family, other specialists and medical decision making of high complexity.I have made any additions or clarifications directly to the above note.  I spent 30 minutes of neurocritical care time  in the care of  this patient. Delia HeadyPramod Sethi, MD  To  contact Stroke Continuity provider, please refer to [email or web address]. After hours, contact General Neurology

## 2014-08-18 NOTE — Progress Notes (Signed)
Internal Medicine Night Float Interim Progress Note  S: Ask to see patient by day team. Patient resting comfortably in bed. She reports not eating much of her dinner because she did not like the way it tasted. She recalls the incident from this afternoon working with occupational therapy, and says she felt weird. She says she currently feels normal without lightheadedness, weakness, chest pain, or shortness of breath.  O: Filed Vitals:   08/18/14 1800  BP: 117/84  Pulse: 81  Temp: 98.7 F (37.1 C)  Resp: 16   Physical Exam  Constitutional: She is oriented to person, place, and time and well-developed, well-nourished, and in no distress. No distress.  Cardiovascular: Normal rate, regular rhythm and normal heart sounds.   Pulmonary/Chest: Effort normal and breath sounds normal. No respiratory distress. She has no wheezes.  Neurological: She is alert and oriented to person, place, and time. She exhibits normal muscle tone.  4-/5 strength in left arm, 4 out of 5 strength in left leg. Generalized decreased strength compared to exam from yesterday.  Skin: Skin is warm and dry. She is not diaphoretic.    A/P: Patient improved this evening after receiving normal saline bolus and continued IV fluids, likely vasovagal episode earlier today. Generalized weakness would likely benefit from further rehydration. -Continue normal saline at 50 mL per hour for a total of 10 hours. -Encouraged patient to ask for help with ambulation.  Luisa DagoEverett Milisa Kimbell, MD, PhD Internal Medicine Intern Pager: [phone] 08/18/2014,8:44 PM

## 2014-08-18 NOTE — Progress Notes (Signed)
Subjective: Norma Neal was resting in bed this morning. She had no complaints other than feeling a little weak when she got up to use the restroom. She denies any focal weakness, trouble speaking, numbness, chest pain, SOB. We discussed the plan today for further CVA work-up testing.  Objective: Vital signs in last 24 hours: Filed Vitals:   08/18/14 0339 08/18/14 0534 08/18/14 0800 08/18/14 1000  BP: 135/68 128/62 135/65 96/78  Pulse: 50 95 80 80  Temp: 98.7 F (37.1 C) 97.7 F (36.5 C) 98.6 F (37 C) 98.7 F (37.1 C)  TempSrc: Oral Oral Oral Oral  Resp: Height:      Weight:      SpO2: 98% 99% 96% 98%   Weight change:   Intake/Output Summary (Last 24 hours) at 08/18/14 1126 Last data filed at 08/17/14 1944  Gross per 24 hour  Intake      0 ml  Output    700 ml  Net   -700 ml   Gen: No acute distress, well developed, well nourished, resting in bed HEENT: Atraumatic, PERRL, EOMI, sclerae anicteric, moist mucous membranes Heart: Regular rate and rhythm, normal S1 S2, no murmurs, rubs, or gallops Lungs: Clear to auscultation bilaterally, respirations unlabored Abd: Soft, non-tender, non-distended, + bowel sounds, no hepatosplenomegaly Ext: No edema or cyanosis Neuro: A&O x 4, CN II-XII intact, finger-nose-finger coordination intact, strength 5/5 and symmetric in all extremities, sensation grossly intact, no Babinkski sign b/l  Lab Results: Basic Metabolic Panel:  Recent Labs Lab 08/17/14 1714 08/17/14 1741 08/18/14 0610  NA 145 146* 146*  K 3.5 3.3* 3.3*  CL 111 111 113*  CO2 24  --  24  GLUCOSE 92 89 92  BUN CREATININE 1.44* 1.40* 1.40*  CALCIUM 9.9  --  9.2   Liver Function Tests:  Recent Labs Lab 08/17/14 1714  AST 21  ALT 11  ALKPHOS 69  BILITOT 0.7  PROT 6.6  ALBUMIN 3.8   CBC:  Recent Labs Lab 08/17/14 1714 08/17/14 1741 08/18/14 0610  WBC 5.3  --  4.5  NEUTROABS 3.5  --   --   HGB 10.9* 11.6* 10.3*  HCT 32.2* 34.0*  30.2*  MCV 89.4  --  90.1  PLT 179  --  165   Cardiac Enzymes:  Recent Labs Lab 08/17/14 1714 08/18/14 0610  CKTOTAL 197* 154  TROPONINI  --  <0.03   CBG:  Recent Labs Lab 08/17/14 1713 08/17/14 2311 08/18/14 0643  GLUCAP 84 94 85   Fasting Lipid Panel:  Recent Labs Lab 08/18/14 0610  CHOL 208*  HDL 75  LDLCALC 113*  TRIG 100  CHOLHDL 2.8   Thyroid Function Tests:  Recent Labs Lab 08/17/14 2226  TSH 1.459   Coagulation:  Recent Labs Lab 08/17/14 1714  LABPROT 11.9  INR 0.87   Urine Drug Screen: Drugs of Abuse     Component Value Date/Time   LABOPIA NONE DETECTED 08/17/2014 1710   COCAINSCRNUR NONE DETECTED 08/17/2014 1710   LABBENZ NONE DETECTED 08/17/2014 1710   AMPHETMU NONE DETECTED 08/17/2014 1710   THCU NONE DETECTED 08/17/2014 1710   LABBARB NONE DETECTED 08/17/2014 1710    Alcohol Level:  Recent Labs Lab 08/17/14 1714  ETH <5   Urinalysis:  Recent Labs Lab 08/17/14 1710  COLORURINE YELLOW  LABSPEC 1.005  PHURINE 6.5  GLUCOSEU NEGATIVE  HGBUR NEGATIVE  BILIRUBINUR NEGATIVE  KETONESUR NEGATIVE  PROTEINUR NEGATIVE  UROBILINOGEN  0.2  NITRITE NEGATIVE  LEUKOCYTESUR SMALL*   Micro Results: No results found for this or any previous visit (from the past 240 hour(s)). Studies/Results: Ct Head Wo Contrast  08/17/2014   CLINICAL DATA:  74 year old female with a history of transient ischemic attack.  EXAM: CT HEAD WITHOUT CONTRAST  TECHNIQUE: Contiguous axial images were obtained from the base of the skull through the vertex without intravenous contrast.  COMPARISON:  MR brain 05/09/2014  FINDINGS: Unremarkable appearance of the calvarium without acute fracture or aggressive lesion.  Unremarkable appearance of the scalp soft tissues.  Unremarkable appearance of the bilateral orbits.  Mastoid air cells are clear.  No significant paranasal sinus disease  No acute intracranial hemorrhage.  No midline shift or mass effect.  Re-  demonstration of encephalomalacia changes of bilateral occipital region, present on prior MR. Confluent hypodensity in the periventricular white matter, similar distribution to the prior MRI.  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 This has not significantly changed in size since the MR.  Gray-white differentiation maintained.  Intracranial atherosclerotic calcifications of the anterior circulation.  IMPRESSION: No CT evidence of acute intracranial abnormality.  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  Periventricular white matter disease, compatible with small vessel disease. Intracranial atherosclerosis.  Signed,  Yvone NeuJaime S. Loreta AveWagner, DO  [facility]  [facility]   Electronically Signed   By: Gilmer MorJaime  Wagner D.O.   On: 08/17/2014 20:00   Mr Maxine GlennMra Head Wo Contrast  08/18/2014   CLINICAL DATA:  Sudden onset difficulty ambulating, gait instability today at doctor's office. Similar episode last week. History of hypertension, diabetes and neuropathy, chronic kidney disease, Alzheimer's, depression, tardive dyskinesia.  EXAM: MRI HEAD WITHOUT CONTRAST  MRA HEAD WITHOUT CONTRAST  TECHNIQUE: Multiplanar, multiecho pulse sequences of the brain and surrounding structures were obtained without intravenous contrast. Angiographic images of the head were obtained using MRA technique without contrast.  COMPARISON:  CT of the head August 17, 2014 at 1910 hours and MRI of the brain May 09, 2014  FINDINGS: MRI HEAD FINDINGS  No reduced diffusion to suggest acute ischemia. No susceptibility artifact to suggest hemorrhage. Stable mineralization RIGHT frontal lobe, a few scattered peripherally distributed foci of susceptibility artifact and, susceptibility artifact associated with known LEFT parafalcine vertex probable meningioma. Mild ex vacuo dilatation of LEFT greater than RIGHT occipital horns associated  with encephalomalacia and gliosis. Confluence supratentorial and to lesser extent pontine white matter FLAIR T2 hyperintensities again noted. Small area of LEFT frontal and parietal convexity encephalomalacia. Tiny T2 hyperintensities seen in the basal ganglia and RIGHT thalamus were present previously.  No abnormal extra-axial fluid collections. Trace bilateral mastoid effusions. Mild paranasal sinus mucosal thickening without air-fluid levels. Status post bilateral ocular lens implants. Mild sellar expansion with superimposed subcentimeter soft tissue mass along the anterior sella/clinoid possibly arising from the planum sphenoidale.  MRA HEAD FINDINGS  Anterior circulation: Normal flow related enhancement of the included cervical, petrous, cavernous and supra clinoid internal carotid arteries. Patent anterior communicating artery. Normal flow related enhancement of the anterior and middle cerebral arteries, including more distal segments. Mild stenosis mid RIGHT M2 segment. Mid high-grade stenosis of distal LEFT M3 segment.  No large vessel occlusion, high-grade stenosis, abnormal luminal irregularity, aneurysm.  Posterior circulation: Codominant vertebral arteries. Normal flow related enhancement of the vertebral arteries. Short-segment moderate stenosis of the proximal basilar artery, with mild poststenotic dilatation. Moderate luminal irregularity basilar artery likely reflects  atherosclerosis. Tiny LEFT posterior communicating artery is present. Bilateral posterior cerebral arteries are patent. At least mild multifocal narrowing of LEFT P2 segment.  No large vessel occlusion, suspicious luminal irregularity, aneurysm.  IMPRESSION: MRI HEAD: No acute intracranial process, specifically no acute ischemia.  Remote bilateral posterior cerebral artery territory infarcts. LEFT frontoparietal encephalomalacia suggest remote LEFT middle cerebral artery territory infarct.  Moderate to severe chronic small vessel  ischemic disease. Scattered micro hemorrhages likely reflect sequelae of chronic hypertension. Remote bilateral basal ganglia and RIGHT thalamus lacunar infarcts and or perivascular spaces.  Probable small meningioma LEFT frontal convexity in addition to subcentimeter suspected planum sphenoidale meningioma, these could be further characterized with contrast-enhanced MRI if there is no contraindication.  MRA HEAD: No large vessel occlusion. Multifocal stenosis of the anterior and posterior circulation.  Moderate luminal irregularity of the posterior cerebral arteries likely represents chronic small vessel ischemic disease.   Electronically Signed   By: Awilda Metro   On: 08/18/2014 01:18   Mr Brain Wo Contrast  08/18/2014   CLINICAL DATA:  Sudden onset difficulty ambulating, gait instability today at doctor's office. Similar episode last week. History of hypertension, diabetes and neuropathy, chronic kidney disease, Alzheimer's, depression, tardive dyskinesia.  EXAM: MRI HEAD WITHOUT CONTRAST  MRA HEAD WITHOUT CONTRAST  TECHNIQUE: Multiplanar, multiecho pulse sequences of the brain and surrounding structures were obtained without intravenous contrast. Angiographic images of the head were obtained using MRA technique without contrast.  COMPARISON:  CT of the head August 17, 2014 at 1910 hours and MRI of the brain May 09, 2014  FINDINGS: MRI HEAD FINDINGS  No reduced diffusion to suggest acute ischemia. No susceptibility artifact to suggest hemorrhage. Stable mineralization RIGHT frontal lobe, a few scattered peripherally distributed foci of susceptibility artifact and, susceptibility artifact associated with known LEFT parafalcine vertex probable meningioma. Mild ex vacuo dilatation of LEFT greater than RIGHT occipital horns associated with encephalomalacia and gliosis. Confluence supratentorial and to lesser extent pontine white matter FLAIR T2 hyperintensities again noted. Small area of LEFT frontal and  parietal convexity encephalomalacia. Tiny T2 hyperintensities seen in the basal ganglia and RIGHT thalamus were present previously.  No abnormal extra-axial fluid collections. Trace bilateral mastoid effusions. Mild paranasal sinus mucosal thickening without air-fluid levels. Status post bilateral ocular lens implants. Mild sellar expansion with superimposed subcentimeter soft tissue mass along the anterior sella/clinoid possibly arising from the planum sphenoidale.  MRA HEAD FINDINGS  Anterior circulation: Normal flow related enhancement of the included cervical, petrous, cavernous and supra clinoid internal carotid arteries. Patent anterior communicating artery. Normal flow related enhancement of the anterior and middle cerebral arteries, including more distal segments. Mild stenosis mid RIGHT M2 segment. Mid high-grade stenosis of distal LEFT M3 segment.  No large vessel occlusion, high-grade stenosis, abnormal luminal irregularity, aneurysm.  Posterior circulation: Codominant vertebral arteries. Normal flow related enhancement of the vertebral arteries. Short-segment moderate stenosis of the proximal basilar artery, with mild poststenotic dilatation. Moderate luminal irregularity basilar artery likely reflects atherosclerosis. Tiny LEFT posterior communicating artery is present. Bilateral posterior cerebral arteries are patent. At least mild multifocal narrowing of LEFT P2 segment.  No large vessel occlusion, suspicious luminal irregularity, aneurysm.  IMPRESSION: MRI HEAD: No acute intracranial process, specifically no acute ischemia.  Remote bilateral posterior cerebral artery territory infarcts. LEFT frontoparietal encephalomalacia suggest remote LEFT middle cerebral artery territory infarct.  Moderate to severe chronic small vessel ischemic disease. Scattered micro hemorrhages likely reflect sequelae of chronic hypertension. Remote bilateral basal ganglia and RIGHT thalamus lacunar  infarcts and or  perivascular spaces.  Probable small meningioma LEFT frontal convexity in addition to subcentimeter suspected planum sphenoidale meningioma, these could be further characterized with contrast-enhanced MRI if there is no contraindication.  MRA HEAD: No large vessel occlusion. Multifocal stenosis of the anterior and posterior circulation.  Moderate luminal irregularity of the posterior cerebral arteries likely represents chronic small vessel ischemic disease.   Electronically Signed   By: Awilda Metro   On: 08/18/2014 01:18   Medications: I have reviewed the patient's current medications. Scheduled Meds: .  stroke: mapping our early stages of recovery book   Does not apply Once  . aspirin  325 mg Oral Daily  . escitalopram  20 mg Oral Daily  . heparin  5,000 Units Subcutaneous 3 times per day  . levothyroxine  25 mcg Oral QAC breakfast  . mirtazapine  30 mg Oral QHS  . pantoprazole  40 mg Oral BID AC  . potassium chloride  40 mEq Oral Once  . pravastatin  40 mg Oral q1800  . risperiDONE  2 mg Oral BID   Continuous Infusions:  PRN Meds:. Assessment/Plan: Principal Problem:   TIA (transient ischemic attack) Active Problems:   Acute-on-chronic kidney injury   Anemia of chronic disease   Elevated CK   Alzheimer's disease   Hypothyroidism   Atypical chest pain  #Transient ischemic attack Norma Norma Neal admitted with neuro recommending TIA work-up. MRI notes no acute process but remote b/l posterior cerebral artery infarcts, L frontoparietal encephalomalacia suggesting remote L MCA infarct, remote b/l basal ganglia and R thalamus lacunar infarcts, and probably small meningioma L frontal convexity but cannot get contrast-enhanced MRI due to renal function. She has risk factors given her previous stroke and type 2 diabetes. Seen by neurology who recommends a full stroke workup. -appreciate neuro -hemoglobin A1c -Echocardiogram -Carotid Dopplers. -Aspirin 325 mg daily - hold on statin as CK  was minimally elevated -PT/OT/SLP -Neurochecks - telemetry  #Type 2 diabetes Glucose normal on presentation. No hemoglobin A1c in our system. Reported history of neuropathy and nephropathy. Not on any medications currently. AM CBG 85. -Check hemoglobin A1c. -CBGs before meals and at bedtime.  #Chronic kidney disease Creatinine on presentation 1.44 down from 2.3 3 years ago. This morning stable at 1.40 -Trend creatinine.  #Anemia Hemoglobin 10.9 on presentation up from 8.8 2 years ago, now 10.3. No prior anemia panel in our system. -Trend hemoglobin. -Check anemia panel.  #Hypertension She is on amlodipine 10 mg daily and lisinopril 20 mg daily at home. -Hold home hypertensive medications in setting of TIA.  #Hypothyroidism TSH 1.46 on presentation -Continue home Synthroid 25 g daily.  #GERD -Continue home Protonix 40 mg twice a day.  #Hyperlipidemia CK slightly elevated at 197 now 154 this morning -Hold home pravastatin 40 mg daily for now.  #Alzheimer's disease/depression On risperidone 2 mg twice a day with history of tardive dyskinesia that is persistent. Also takes mirtazapine, zolpidem, Ambien, and donepezil at home. -Continue home escitalopram 20 g daily. -Continue home Risperdal 2 mg twice a day. -Continue home mirtazapine 30 mg daily at bedtime. -Hold home Ambien. -Hold home Aricept  Dispo: Disposition is deferred at this time, awaiting improvement of current medical problems.  Anticipated discharge in approximately 1 day(s).   The patient does have a current PCP Jethro Bastos, MD) and does need an [facility] follow-up appointment after discharge.  The patient does not know have transportation limitations that hinder transportation to clinic appointments.  .Services Needed  at time of discharge: Y = Yes, Blank = No PT:   OT:   RN:   Equipment:   Other:       Lorenda Hatchet, MD 08/18/2014, 11:26 AM

## 2014-08-18 NOTE — Progress Notes (Addendum)
Occupational Therapy Evaluation Patient Details Name: Norma Neal MRN: [id number] DOB: 05/26/1940 Today's Date: 08/18/2014    History of Present Illness 74 y.o. female hx of tardive dyskinesia presenting for evaluation of transient episodes of gait instability and slurred speech. Neuro work up underway.   Clinical Impression   Pt with syncopal event during bathroom toilet hygiene. Question vasovagel vs seizure vs CVA ( drool R side of mouth) in appearance. Pt with eye rolled upward and fixated. Pt with no response to staff with name call or sternal rub. See vitals. OT to further assess 4/22 as appropriate to further determine plans for d/c. Pt currently with RN staff assisting with care due to events.     Follow Up Recommendations  Home health OT;Other (comment) (question need for SNF- to be further assessed)    Equipment Recommendations       Recommendations for Other Services       Precautions / Restrictions Precautions Precautions: Fall      Mobility Bed Mobility Overal bed mobility: Modified Independent             General bed mobility comments: incr time  Transfers Overall transfer level: Needs assistance Equipment used: Rolling walker (2 wheeled) Transfers: Sit to/from Stand Sit to Stand: Min assist         General transfer comment: cues for safety and hand placement. Pt pushing the Rw too far in advance and very slow gait    Balance Overall balance assessment: Needs assistance Sitting-balance support: Feet supported;Bilateral upper extremity supported Sitting balance-Leahy Scale: Poor                                      ADL Overall ADL's : Needs assistance/impaired                                       General ADL Comments: Pt supine on arrival and agreeable to bathroom transfer and sink level grooming. OT setup room and (A)ing patient with RW to commode. Pt max (A) to doff brief. pt voiding bowels on commode and with  a very flexed posture. OT asking pain questions and guarding pt. Pt provided toilet paper and wiping with R hand. Pt attempting to pull up brief and stool on R hand. Pt static standing impulsively and says "i need to sit down" PT with eyes rolling upward. OT closing commode and returning patient to sitting position. pt noted to have oral saliva from R side of mouth. Pt no verbal responses. RN called stat and arriving to obtain vitals. Pt with MAP ( 73) and oxygen > 90  see vitals from RN . Pt becoming more aroused and responsive after 1 minute approximately. Pt ambulated total +2 ~4 feet and return supine. pt with eyes rolling upward fixated gaze and vomitting. RN s present. Pt log rolled to R side by OT. OT reported previous mentioned to hospitalist via phone     Vision Additional Comments: unable to assess with functional task due to events   Perception     Praxis      Pertinent Vitals/Pain Pain Assessment: Faces Faces Pain Scale: Hurts even more Pain Location: stomach Pain Intervention(s): Repositioned (Rn present)     Hand Dominance Right   Extremity/Trunk Assessment Upper Extremity Assessment Upper Extremity Assessment: Difficult to assess due to impaired  cognition;Overall [facility] for tasks assessed   Lower Extremity Assessment Lower Extremity Assessment: Defer to PT evaluation   Cervical / Trunk Assessment Cervical / Trunk Assessment: Kyphotic   Communication Communication Communication: HOH   Cognition Arousal/Alertness: Awake/alert Behavior During Therapy: Flat affect Overall Cognitive Status: History of cognitive impairments - at baseline                     General Comments       Exercises       Shoulder Instructions      Home Living Family/patient expects to be discharged to:: Private residence Living Arrangements: Children Available Help at Discharge: Family Type of Home: House Home Access: Stairs to enter   Entrance Stairs-Rails: Right;Left Home  Layout: Two level Alternate Level Stairs-Number of Steps: 6 Alternate Level Stairs-Rails: Right;Left Bathroom Shower/Tub: Tub/shower unit Shower/tub characteristics: Engineer, building services: Standard Bathroom Accessibility: Yes   Home Equipment: Environmental consultant - 2 wheels;Walker - 4 wheels   Additional Comments: all information obtained from PT and previous admission      Prior Functioning/Environment Level of Independence: Independent with assistive device(s)        Comments: uses RW     OT Diagnosis: Generalized weakness;Cognitive deficits;Altered mental status   OT Problem List: Decreased strength;Decreased range of motion;Decreased activity tolerance;Impaired balance (sitting and/or standing);Decreased cognition;Decreased safety awareness;Decreased knowledge of use of DME or AE;Decreased knowledge of precautions   OT Treatment/Interventions: Self-care/ADL training;Therapeutic exercise;DME and/or AE instruction;Cognitive remediation/compensation;Therapeutic activities;Patient/family education;Balance training    OT Goals(Current goals can be found in the care plan section) Acute Rehab OT Goals Patient Stated Goal: none stated OT Goal Formulation: Patient unable to participate in goal setting Time For Goal Achievement: 09/01/14 Potential to Achieve Goals: Good  OT Frequency: Min 2X/week   Barriers to D/C:            Co-evaluation              End of Session Equipment Utilized During Treatment: Engineer, water Communication: Mobility status;Precautions  Activity Tolerance: Other (comment) (syncopal event ) Patient left: in bed;with call bell/phone within reach;with nursing/sitter in room   Time: 1246-1302 OT Time Calculation (min): 16 min Charges:  OT General Charges $OT Visit: 1 Procedure OT Evaluation $Initial OT Evaluation Tier I: 1 Procedure G-Codes: OT G-codes **NOT FOR INPATIENT CLASS** Functional Assessment Tool Used: clinical judgement Functional  Limitation: Self care Self Care Current Status (K7425): At least 80 percent but less than 100 percent impaired, limited or restricted Self Care Goal Status (Z5638): At least 80 percent but less than 100 percent impaired, limited or restricted  Norma Neal 08/18/2014, 1:16 PM  Pager: [phone]

## 2014-08-18 NOTE — Progress Notes (Addendum)
S:  I was called by RN at about 1300 that during OT, Norma Neal went to the restroom and had syncopal episode. RN said vitals were normal and CBG in the 130s. I asked RN to get EKG and I immediately went to bedside. OT was present and told me that she helped Norma Neal to the toilet and while Norma Neal was sitting on the toilet and had just had a stool, she was leaning forward and her eyes rolled back in her head, she drooled, and was unresponsive. Length of episode unclear but OT thinks it was about a minute. The whole episode was witnessed, there was no fall, and no injury. OT helped patient to bed and patient had episode of non-bloody, non-bilious emesis.   On interview, Norma Neal was laying in bed. She said that she felt better but that she still felt tired and weak. She denied any chest pain, SOB, focal weakness.  O:  HR (EKG) 83, BP 109/63, 90% on RA, T and RR unrecorded Orthostatics 97/50 w HR 76 lying, 108/56 w HR 82 sitting.  Gen: Drowsy laying in bed w EKG leads on, well developed, well nourished HEENT: Atraumatic, PERRL, EOMI, sclerae anicteric, slightly dry mucous membranes Heart: Regular rate and rhythm, normal S1 S2, no murmurs, rubs, or gallops Lungs: Clear to auscultation bilaterally, respirations unlabored Abd: Soft, non-tender, non-distended, + bowel sounds, no hepatosplenomegaly Ext: No edema or cyanosis Neuro: A&O x 4, drowsy but arousable, CN II-XII intact, strength 5/5 and symmetric in all extremities, sensation grossly intact, no Babinkski sign b/l  EKG: NSR with premature atrial complexes, no ST or t-wave changes compared to prior this morning  A/P:   Norma Neal most likely had vasovagal episode given the bowel movement. Her blood glucose was fine. She was somewhat dry on exam but orthostatics negative. EKG without ST or t-wave changes compared to this morning. Troponin was negative this morning. Seizure activity not described by witness although she did have some drooling and  eyes rolled. -NS @ 50 cc/hr -neuro checks q4h -cont to monitor  Farley LyAdam Lequita Meadowcroft, MD Internal Medicine, PGY-1 Pager [phone] 08/18/2014, 1:41 PM   ADDENDUM: I spoke with Dr Pearlean BrownieSethi who felt this was due to dehydration and ordered additional 500 cc bolus and increased neuro check to q2h   Farley LyAdam Garris Melhorn, MD Internal Medicine, PGY-1 Pager [phone] 08/18/2014, 2:48 PM

## 2014-08-18 NOTE — Progress Notes (Addendum)
Per therapist: "Pt with syncopal event during bathroom toilet hygiene. Question vasovagel vs seizure vs CVA ( drool R side of mouth) in appearance. Pt with eye rolled upward and fixated. Pt with no response to staff with name call or sternal rub."  RN was called and paged the attending Doctor as well as code stroke. Dr Pearlean BrownieSethi and BiBy NP responded and assessed the patient. VS and neuro check was set q2h until 2 AM CBG was 134, orthostatic BP and 12 lead  EKG was completed. 500 ML bolus was given and on 2 liter of oxygen. Patient is on Flat bed rest.  NO TPA ordered.

## 2014-08-19 DIAGNOSIS — N183 Chronic kidney disease, stage 3 (moderate): Secondary | ICD-10-CM

## 2014-08-19 DIAGNOSIS — G459 Transient cerebral ischemic attack, unspecified: Secondary | ICD-10-CM

## 2014-08-19 DIAGNOSIS — Z8673 Personal history of transient ischemic attack (TIA), and cerebral infarction without residual deficits: Secondary | ICD-10-CM

## 2014-08-19 DIAGNOSIS — Z79899 Other long term (current) drug therapy: Secondary | ICD-10-CM

## 2014-08-19 DIAGNOSIS — Z7982 Long term (current) use of aspirin: Secondary | ICD-10-CM

## 2014-08-19 DIAGNOSIS — N179 Acute kidney failure, unspecified: Secondary | ICD-10-CM

## 2014-08-19 DIAGNOSIS — F329 Major depressive disorder, single episode, unspecified: Secondary | ICD-10-CM

## 2014-08-19 DIAGNOSIS — I951 Orthostatic hypotension: Secondary | ICD-10-CM | POA: Diagnosis not present

## 2014-08-19 DIAGNOSIS — R270 Ataxia, unspecified: Secondary | ICD-10-CM | POA: Diagnosis not present

## 2014-08-19 DIAGNOSIS — E1122 Type 2 diabetes mellitus with diabetic chronic kidney disease: Secondary | ICD-10-CM | POA: Diagnosis not present

## 2014-08-19 DIAGNOSIS — I129 Hypertensive chronic kidney disease with stage 1 through stage 4 chronic kidney disease, or unspecified chronic kidney disease: Secondary | ICD-10-CM | POA: Diagnosis not present

## 2014-08-19 DIAGNOSIS — G458 Other transient cerebral ischemic attacks and related syndromes: Secondary | ICD-10-CM

## 2014-08-19 LAB — GLUCOSE, CAPILLARY
GLUCOSE-CAPILLARY: 88 mg/dL (ref 70–99)
Glucose-Capillary: 136 mg/dL — ABNORMAL HIGH (ref 70–99)
Glucose-Capillary: 81 mg/dL (ref 70–99)
Glucose-Capillary: 117 mg/dL — ABNORMAL HIGH (ref 70–99)
Glucose-Capillary: 185 mg/dL — ABNORMAL HIGH (ref 70–99)

## 2014-08-19 LAB — BASIC METABOLIC PANEL
Anion gap: 8 (ref 5–15)
BUN: 16 mg/dL (ref 6–23)
CHLORIDE: 116 mmol/L — AB (ref 96–112)
CO2: 21 mmol/L (ref 19–32)
Calcium: 8.5 mg/dL (ref 8.4–10.5)
Creatinine, Ser: 1.2 mg/dL — ABNORMAL HIGH (ref 0.50–1.10)
GFR calc Af Amer: 51 mL/min — ABNORMAL LOW (ref >90)
GFR calc non Af Amer: 44 mL/min — ABNORMAL LOW (ref >90)
GLUCOSE: 97 mg/dL (ref 70–99)
Potassium: 3.9 mmol/L (ref 3.5–5.1)
SODIUM: 145 mmol/L (ref 135–145)

## 2014-08-19 LAB — CBC
HEMATOCRIT: 29.1 % — AB (ref 36.0–46.0)
Hemoglobin: 9.8 g/dL — ABNORMAL LOW (ref 12.0–15.0)
MCH: 30.4 pg (ref 26.0–34.0)
MCHC: 33.7 g/dL (ref 30.0–36.0)
MCV: 90.4 fL (ref 78.0–100.0)
Platelets: 148 10*3/uL — ABNORMAL LOW (ref 150–400)
RBC: 3.22 MIL/uL — ABNORMAL LOW (ref 3.87–5.11)
RDW: 14.2 % (ref 11.5–15.5)
WBC: 4.5 10*3/uL (ref 4.0–10.5)

## 2014-08-19 LAB — CK: Total CK: 126 U/L (ref 7–177)

## 2014-08-19 MED ORDER — ASPIRIN 325 MG PO TABS: 325.0000 mg | ORAL_TABLET | Freq: Every day | ORAL | Status: DC

## 2014-08-19 MED ORDER — MIRTAZAPINE 15 MG PO TABS: 30.0000 mg | ORAL_TABLET | Freq: Every day | ORAL | Status: DC

## 2014-08-19 MED ORDER — SODIUM CHLORIDE 0.9 % IV BOLUS (SEPSIS)
500.0000 mL | Freq: Once | INTRAVENOUS | Status: AC
  Administered 2014-08-19: 500 mL via INTRAVENOUS

## 2014-08-19 MED ORDER — SODIUM CHLORIDE 0.9 % IV SOLN
INTRAVENOUS | Status: DC
  Administered 2014-08-19 – 2014-08-20 (×2): via INTRAVENOUS

## 2014-08-19 MED ORDER — FERROUS SULFATE 325 (65 FE) MG PO TABS
325.0000 mg | ORAL_TABLET | Freq: Every day | ORAL | Status: DC
  Administered 2014-08-20 – 2014-08-21 (×2): 325 mg via ORAL
  Filled 2014-08-19 (×2): qty 1

## 2014-08-19 MED ORDER — FERROUS SULFATE 325 (65 FE) MG PO TABS: 325.0000 mg | ORAL_TABLET | Freq: Every day | ORAL | Status: DC

## 2014-08-19 MED ORDER — MIRTAZAPINE 15 MG PO TABS
15.0000 mg | ORAL_TABLET | Freq: Every day | ORAL | Status: DC
  Administered 2014-08-19 – 2014-08-20 (×2): 15 mg via ORAL
  Filled 2014-08-19 (×2): qty 1

## 2014-08-19 MED ORDER — RISPERIDONE 1 MG PO TBDP
1.0000 mg | ORAL_TABLET | Freq: Two times a day (BID) | ORAL | Status: DC
  Administered 2014-08-19 – 2014-08-20 (×2): 1 mg via ORAL
  Filled 2014-08-19 (×3): qty 1

## 2014-08-19 NOTE — Progress Notes (Signed)
Echocardiogram 2D Echocardiogram has been performed.  Dorothey BasemanReel, Seanpatrick Maisano M 08/19/2014, 10:00 AM

## 2014-08-19 NOTE — Progress Notes (Signed)
Physical Therapy Treatment Patient Details Name: Norma Neal MRN: [id number] DOB: 07/24/1940 Today's Date: 08/19/2014    History of Present Illness 74 y.o. female hx of tardive dyskinesia presenting for evaluation of transient episodes of gait instability and slurred speech. Neuro work up underway.    PT Comments    Patient continues to demonstrate significant deficits from baseline mobility at this time. During session, orthostatic vitals assessed. Patient with initial standing value of 139/70 which dropped to 103/63 after 3 minutes static standing. Attempted ambulation with patient after rest, patient reports significant dizziness, inability to maintain stability despite RW (distance of less than 50'), patient began to require increased assist with BLE instability and poor ability to focus gaze. Attempted to engage in conversation, patient flat with poor responsiveness. Assisted patient to sitting HR elevated to 140s. Patient brought back to room and left to rest in the chair BP improved to 136/70 with HR coming down to 96 bpm. Assisted patient back to bed and left with nurse techs to be placed on bedpan. [facility] inform of mobility concerns. At this time, patient is a significant risk for fall.  Will continue to see as indicated and progress as tolerated.   Follow Up Recommendations  Home health PT;Supervision/Assistance - 24 hour     Equipment Recommendations  None recommended by PT    Recommendations for Other Services       Precautions / Restrictions Precautions Precautions: Fall Precaution Comments: check orthostatic BP    Mobility  Bed Mobility Overal bed mobility: Needs Assistance Bed Mobility: Supine to Sit;Sit to Supine     Supine to sit: Min assist;HOB elevated Sit to supine: Supervision   General bed mobility comments: Pt needs cues for sequence task and heavy use of bed rail. incr time  Transfers Overall transfer level: Needs assistance Equipment  used: Rolling walker (2 wheeled) Transfers: Sit to/from Stand Sit to Stand: Min assist            Ambulation/Gait Ambulation/Gait assistance: Min assist (to mod assist with BLE buckling) Ambulation Distance (Feet): 40 Feet Assistive device: Right platform walker Gait Pattern/deviations: Step-through pattern;Decreased stride length;Narrow base of support;Drifts right/left Gait velocity: significantly decreased Gait velocity interpretation: <1.8 ft/sec, indicative of risk for recurrent falls General Gait Details: Very limted ambulation secondary to patient with increased dizziness, pt reports needing to sit, eyes became difficult to focus and patient assisted to sitting position, HR checked and elevated 140s.    Stairs            Wheelchair Mobility    Modified Rankin (Stroke Patients Only)       Balance Overall balance assessment: Needs assistance Sitting-balance support: Feet supported Sitting balance-Leahy Scale: Poor       Standing balance-Leahy Scale: Poor Standing balance comment: BLE shaking upon static standing (+ orthostatics)                    Cognition Arousal/Alertness: Awake/alert Behavior During Therapy: Flat affect Overall Cognitive Status: History of cognitive impairments - at baseline                      Exercises      General Comments        Pertinent Vitals/Pain Pain Assessment: No/denies pain    Home Living     Available Help at Discharge: Family;Other (Comment) ([facility]/home aid) Type of Home: House              Prior  Function            PT Goals (current goals can now be found in the care plan section) Acute Rehab PT Goals Patient Stated Goal: none stated PT Goal Formulation: With patient Time For Goal Achievement: 09/01/14 Potential to Achieve Goals: Fair Progress towards PT goals: Not progressing toward goals - comment Patient with + dizziness and symptomatic BPs    Frequency  Min 3X/week    PT  Plan Current plan remains appropriate    Co-evaluation             End of Session Equipment Utilized During Treatment: Gait belt Activity Tolerance: Patient tolerated treatment well Patient left: in bed;with call bell/phone within reach;with nursing in room     Time: 1610-9604 PT Time Calculation (min) (ACUTE ONLY): 23 min  Charges:  $Therapeutic Activity: 23-37 mins                    G CodesFabio Asa 09/16/14, 3:24 PM Charlotte Crumb, PT DPT  [phone]

## 2014-08-19 NOTE — Progress Notes (Signed)
STROKE TEAM PROGRESS NOTE   HISTORY [place] Dragonula Satcher is an 74 y.o. female hx of tardive dyskinesia presenting for evaluation of transient episodes of gait instability and slurred speech. In the past week has had 2 episodes of acute onset gait instability, sensation of feeling like she is falling. Lasted around 5 minutes each time. Also noted some slurred speech during these episodes along with question of facial droop (unclear which side). Currently returned to baseline.   Has history of abnormal gait which was evaluated with MRI brain in December 2015. 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 She is not on a home anti-platelet medication.   Patient was not administered TPA secondary to resolution of symptoms. She was admitted for further evaluation and treatment.   SUBJECTIVE (INTERVAL HISTORY) No family members present. The patient is without complaints. She feels her condition is essentially unchanged.   OBJECTIVE Temp:  [98.1 F (36.7 C)-98.8 F (37.1 C)] 98.1 F (36.7 C) (04/22 1419) Pulse Rate:  [42-92] 92 (04/22 1419) Cardiac Rhythm:  [-] Normal sinus rhythm (04/22 0800) Resp:  [16-20] 20 (04/22 1419) BP: (117-157)/(62-91) 134/62 mmHg (04/22 0916) SpO2:  [97 %-100 %] 98 % (04/22 0916)   Recent Labs Lab 08/18/14 1252 08/18/14 1614 08/18/14 2212 08/19/14 0634 08/19/14 1108  GLUCAP 136* 122* 96 88 81    Recent Labs Lab 08/17/14 1714 08/17/14 1741 08/18/14 0610 08/19/14 0550  NA 145 146* 146* 145  K 3.5 3.3* 3.3* 3.9  CL 111 111 113* 116*  CO2 24  --  24 21  GLUCOSE 92 89 92 97  BUN 18 19 17 16   CREATININE 1.44* 1.40* 1.40* 1.20*  CALCIUM 9.9  --  9.2 8.5    Recent Labs Lab 08/17/14 1714  AST 21  ALT 11  ALKPHOS 69  BILITOT 0.7  PROT 6.6  ALBUMIN 3.8    Recent Labs Lab 08/17/14 1714 08/17/14 1741 08/18/14 0610 08/19/14 0550  WBC 5.3  --  4.5 4.5  NEUTROABS 3.5   --   --   --   HGB 10.9* 11.6* 10.3* 9.8*  HCT 32.2* 34.0* 30.2* 29.1*  MCV 89.4  --  90.1 90.4  PLT 179  --  165 148*    Recent Labs Lab 08/17/14 1714 08/18/14 0610 08/18/14 1317 08/19/14 0550  CKTOTAL 197* 154  --  126  TROPONINI  --  <0.03 <0.03  --     Recent Labs  08/17/14 1714  LABPROT 11.9  INR 0.87    Recent Labs  08/17/14 1710  COLORURINE YELLOW  LABSPEC 1.005  PHURINE 6.5  GLUCOSEU NEGATIVE  HGBUR NEGATIVE  BILIRUBINUR NEGATIVE  KETONESUR NEGATIVE  PROTEINUR NEGATIVE  UROBILINOGEN 0.2  NITRITE NEGATIVE  LEUKOCYTESUR SMALL*       Component Value Date/Time   CHOL 208* 08/18/2014 0610   TRIG 100 08/18/2014 0610   HDL 75 08/18/2014 0610   CHOLHDL 2.8 08/18/2014 0610   VLDL 20 08/18/2014 0610   LDLCALC 113* 08/18/2014 0610   No results found for: HGBA1C    Component Value Date/Time   LABOPIA NONE DETECTED 08/17/2014 1710   COCAINSCRNUR NONE DETECTED 08/17/2014 1710   LABBENZ NONE DETECTED 08/17/2014 1710   AMPHETMU NONE DETECTED 08/17/2014 1710   THCU NONE DETECTED 08/17/2014 1710   LABBARB NONE DETECTED 08/17/2014 1710     Recent Labs Lab 08/17/14 1714  ETH <5    Ct Head Wo Contrast 08/17/2014  No CT evidence of acute intracranial abnormality.  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  Periventricular white matter disease, compatible with small vessel disease. Intracranial atherosclerosis.    MRI HEAD 08/18/2014   No acute intracranial process, specifically no acute ischemia.  Remote bilateral posterior cerebral artery territory infarcts. LEFT frontoparietal encephalomalacia suggest remote LEFT middle cerebral artery territory infarct.  Moderate to severe chronic small vessel ischemic disease. Scattered micro hemorrhages likely reflect sequelae of chronic hypertension. Remote bilateral basal ganglia and RIGHT thalamus lacunar infarcts and or perivascular spaces.  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    MRA HEAD 08/18/2014   No large vessel occlusion. Multifocal stenosis of the anterior and posterior circulation.  Moderate luminal irregularity of the posterior cerebral arteries likely represents chronic small vessel ischemic disease.      PHYSICAL EXAM Pleasant  Frail elderly african american lady not in distress. . Afebrile. Head is nontraumatic. Neck is supple without bruit.    Cardiac exam no murmur or gallop. Lungs are clear to auscultation. Distal pulses are well felt. Neurological Exam : Awake alert oriented x 2 with impaired attention, registration and recall. normal speech and language. Mild left lower face asymmetry. Tongue midline. No drift. Mild diminished fine finger movements on left. Orbits right over left upper extremity. Mild left grip weak.. Sensation slightly decreased to light touch on the left upper and lower extremities. Normal coordination.   ASSESSMENT/PLAN Ms. Paislee Szatkowski is a 74 y.o. 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 She did not receive IV t-PA due to resolution of symptoms.   TIA vs recrudescence of previous old stroke symptoms in setting of medical illness  MRI  No acute stroke  MRA  No large vessel occlusion  Carotid Doppler Bilateral carotid artery duplex completed: 1-39% ICA stenosis. Vertebral artery flow is antegrade.  2D Echo - EF 55-60%. No cardiac source of emboli identified.  Heparin 5000 units sq tid or VTE prophylaxis Diet heart healthy/carb modified Room service appropriate?: Yes; Fluid consistency:: Thin Diet - low sodium heart  healthy  no antithrombotic prior to admission, now on aspirin 325 mg orally every day  Ongoing aggressive stroke risk factor management  Therapy recommendations:  Home health PT and OT  Disposition:  Anticipate return home  Hypertension  BP 96-179/71-90 past 24h (08/19/2014 @ 3:08 PM)  Important to maintain blood pressure for adequate perfusion.   Hyperlipidemia  Home meds:  pravachol 40, resumed in hospital  LDL 113  Continue statin at discharge  Diabetes type 2  HgbA1c pending  Other Stroke Risk Factors  Advanced age  Other Active Problems  CKD stage 3  Psychotic depression  Alzheimer's disease  Hospital day #  3  Workup complete except for hemoglobin A1c which is currently pending. Stroke team will sign off at this time. Please call if we can be of further service.   Shannan Harper, DAVID [facility] See Amion for Pager information 08/19/2014 3:08 PM

## 2014-08-19 NOTE — Progress Notes (Signed)
PT Cancellation Note  Patient Details Name: Norma Neal MRN: [id number] DOB: 01/18/1941   Cancelled Treatment:    Reason Eval/Treat Not Completed: Patient at procedure or test/unavailable, patient having echo performed will follow up as time permits.   [name], [name] J 08/19/2014, 10:26 AM [name], PT DPT  [phone]

## 2014-08-19 NOTE — Progress Notes (Signed)
Patient is a member of [facility] ([facility]) that provides community-based services to enrolled individuals who need medical care and support to continue living at home. [facility]; Consuella Loselaine stated - at discharge, have the order for therapy written on the discharge instruction and [facility]  [facility], but are not limited to: Primary Care (including physician and nursing services)  Hospital Care, Emergency Services and Outpatient Services  Medical Specialty Services, Dentistry, Surgical  Prescription Drugs, Over-the-counter medicines and Psychologist, forensicMedical Supplies  Medical Equipment (wheelchairs, walkers, etc.)  Home Care Services, Caregiver Support  Physical, Occupational and Speech Therapies  [facility]  Recreational Therapy, Activities and Exercise  Nutritional Counseling  Social Work Counseling, Systems analystMental Health Services  Social Services  [facility] and medical appointments  End-of-Life Services

## 2014-08-19 NOTE — Progress Notes (Signed)
Occupational Therapy Treatment Patient Details Name: Jaela Yepez MRN: [id number] DOB: [birth year] Today's Date: 08/19/2014    History of present illness 74 y.o. female hx of tardive dyskinesia presenting for evaluation of transient episodes of gait instability and slurred speech. Neuro work up underway.   OT comments  Pt is high risk of fall due to orthostatic BP. Pt with SBP 30 point drop and symptoms of dizziness. Pt with immediate need for bed pain following sequence. Sitting to standing blood pressure reading interrupted by medication pass at EOB. Pt with slight time to rebound but symptoms increasing. Pt with posterior sway against the bed. Pt is unsafe to d/c home without (A) and more awareness to dizziness with fall risk. Sequence of events very similar to 08/18/14 event of syncope however pt remained at eob and return to supine due to therapist asking more symptotic questions.   Orthostatic BPs  Supine 163/80  Sitting 132/101  Sitting after 3 min   Standing 145/57  Bed pan       Follow Up Recommendations  Home health OT    Equipment Recommendations  None recommended by OT    Recommendations for Other Services      Precautions / Restrictions Precautions Precautions: Fall Precaution Comments: check orthostatic BP Restrictions Weight Bearing Restrictions: No       Mobility Bed Mobility Overal bed mobility: Needs Assistance Bed Mobility: Supine to Sit;Sit to Supine     Supine to sit: Min assist;HOB elevated Sit to supine: Supervision   General bed mobility comments: Pt needs cues for sequence task and heavy use of bed rail. incr time  Transfers Overall transfer level: Needs assistance   Transfers: Sit to/from Stand Sit to Stand: Min assist              Balance Overall balance assessment: Needs assistance Sitting-balance support: Feet supported;No upper extremity supported Sitting balance-Leahy Scale: Poor       Standing balance-Leahy Scale:  Poor Standing balance comment: pt with posterior sway                   ADL Overall ADL's : Needs assistance/impaired                                       General ADL Comments: Pt reading Iphone on arrival with flat affect. Pt agreeable to OT session. Orthostatic BP obtain. pt with 30 point SBP drop and student RN passing medication at EOB during orthostatic check. Pt sitting at EOB for > 3 minutes then standing. Pt with symptoms of dizziness once EOB> pt static standing and return to supine. Immediately upon return to supine reports need to void with bed pan.      Vision                     Perception     Praxis      Cognition   Behavior During Therapy: Flat affect Overall Cognitive Status: History of cognitive impairments - at baseline                       Extremity/Trunk Assessment               Exercises     Shoulder Instructions       General Comments      Pertinent Vitals/ Pain  Home Living                                          Prior Functioning/Environment              Frequency Min 2X/week     Progress Toward Goals  OT Goals(current goals can now be found in the care plan section)  Progress towards OT goals: Not progressing toward goals - comment (orthostatic BP)  Acute Rehab OT Goals Patient Stated Goal: none stated OT Goal Formulation: Patient unable to participate in goal setting Time For Goal Achievement: 09/01/14 Potential to Achieve Goals: Good ADL Goals Pt Will Perform Grooming: with min guard assist;standing Pt Will Perform Upper Body Bathing: with min guard assist;sitting Pt Will Perform Lower Body Bathing: with min guard assist;sit to/from stand Pt Will Transfer to Toilet: with min guard assist;bedside commode  Plan Discharge plan remains appropriate    Co-evaluation                 End of Session     Activity Tolerance Other (comment) (limited  by orthostatic check/ symptoms)   Patient Left in bed;with call bell/phone within reach;with nursing/sitter in room   Nurse Communication Mobility status;Precautions    Functional Assessment Tool Used: clinical judgement Functional Limitation: Self care Self Care Current Status ([id number]): At least 80 percent but less than 100 percent impaired, limited or restricted Self Care Goal Status (U0454(G8988): At least 80 percent but less than 100 percent impaired, limited or restricted   Time: 1039-1057 OT Time Calculation (min): 18 min  Charges: OT G-codes **NOT FOR INPATIENT CLASS** Functional Assessment Tool Used: clinical judgement Functional Limitation: Self care Self Care Current Status (U9811(G8987): At least 80 percent but less than 100 percent impaired, limited or restricted Self Care Goal Status (B1478(G8988): At least 80 percent but less than 100 percent impaired, limited or restricted OT General Charges $OT Visit: 1 Procedure OT Treatments $Therapeutic Activity: 8-22 mins  Harolyn RutherfordJones, Elvis Laufer B 08/19/2014, 10:57 AM Pager: [phone]

## 2014-08-19 NOTE — Discharge Summary (Signed)
Name: Norma Neal MRN: [id number] DOB: 1941/01/09 74 y.o. PCP: Jethro Bastos, MD  Date of Admission: 08/17/2014  3:01 PM Date of Discharge: 08/21/2014 Attending Physician: Aletta Edouard, MD  Discharge Diagnosis:  Principal Problem:   TIA (transient ischemic attack) Active Problems:   Acute-on-chronic kidney injury   Anemia of chronic disease   Alzheimer's disease   Atypical chest pain   Ataxia  Discharge Medications:   Medication List    STOP taking these medications        amLODipine 10 MG tablet  Commonly known as:  NORVASC     donepezil 10 MG tablet  Commonly known as:  ARICEPT     pravastatin 40 MG tablet  Commonly known as:  PRAVACHOL     zolpidem 5 MG tablet  Commonly known as:  AMBIEN      TAKE these medications        ANTI-DIARRHEAL PO  Take 2 tablets by mouth daily as needed. For diarrheal symptoms.     aspirin 325 MG tablet  Take 1 tablet (325 mg total) by mouth daily.     docusate sodium 100 MG capsule  Commonly known as:  COLACE  Take 1 capsule (100 mg total) by mouth 2 (two) times daily.     escitalopram 20 MG tablet  Commonly known as:  LEXAPRO  Take 20 mg by mouth daily.     ferrous sulfate 325 (65 FE) MG tablet  Take 1 tablet (325 mg total) by mouth daily with breakfast.     levothyroxine 25 MCG tablet  Commonly known as:  SYNTHROID, LEVOTHROID  Take 25 mcg by mouth daily.     lisinopril 10 MG tablet  Commonly known as:  PRINIVIL,ZESTRIL  Take 2 tablets (20 mg total) by mouth daily.     mirtazapine 15 MG tablet  Commonly known as:  REMERON  Take 1 tablet (15 mg total) by mouth at bedtime.     pantoprazole 40 MG tablet  Commonly known as:  PROTONIX  Take 1 tablet (40 mg total) by mouth 2 (two) times daily before a meal.     risperiDONE 0.5 MG disintegrating tablet  Commonly known as:  RISPERDAL M-TABS  Take 1 mg in the morning, 0.5 mg in the evening        Disposition and follow-up:   Ms.Ingri Biegler was discharged from  [facility] in [place] condition.  At the hospital follow up visit please address:  1.  Antipsychotic medications, antihypertensive medications, statin  2.  Labs / imaging needed at time of follow-up: CBC, BMP  3.  Pending labs/ test needing follow-up: none  Follow-up Appointments: Follow-up Information    Follow up with Thane Edu, MD. Schedule an appointment as soon as possible for a visit in 1 week.   Specialty:  Family Medicine   Why:  [facility] will follow   Contact information:   [address] [place] [address] [phone]       Discharge Instructions: Discharge Instructions    Diet - low sodium heart healthy    Complete by:  As directed      Diet - low sodium heart healthy    Complete by:  As directed      Increase activity slowly    Complete by:  As directed      Increase activity slowly    Complete by:  As directed            Consultations:  Procedures Performed:  Ct Head Wo Contrast  08/17/2014   CLINICAL DATA:  74 year old female with a history of transient ischemic attack.  EXAM: CT HEAD WITHOUT CONTRAST  TECHNIQUE: Contiguous axial images were obtained from the base of the skull through the vertex without intravenous contrast.  COMPARISON:  MR brain 05/09/2014  FINDINGS: Unremarkable appearance of the calvarium without acute fracture or aggressive lesion.  Unremarkable appearance of the scalp soft tissues.  Unremarkable appearance of the bilateral orbits.  Mastoid air cells are clear.  No significant paranasal sinus disease  No acute intracranial hemorrhage.  No midline shift or mass effect.  Re- demonstration of encephalomalacia changes of bilateral occipital region, present on prior MR. Confluent hypodensity in the periventricular white matter, similar distribution to the prior MRI.  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 This has not significantly changed in size since the MR.  Gray-white differentiation  maintained.  Intracranial atherosclerotic calcifications of the anterior circulation.  IMPRESSION: No CT evidence of acute intracranial abnormality.  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  Periventricular white matter disease, compatible with small vessel disease. Intracranial atherosclerosis.  Signed,  Yvone Neu. Loreta Ave, DO  [facility]  [facility]   Electronically Signed   By: Gilmer Mor D.O.   On: 08/17/2014 20:00   Mr [name] Head Wo Contrast  08/18/2014   CLINICAL DATA:  Sudden onset difficulty ambulating, gait instability today at doctor's office. Similar episode last week. History of hypertension, diabetes and neuropathy, chronic kidney disease, Alzheimer's, depression, tardive dyskinesia.  EXAM: MRI HEAD WITHOUT CONTRAST  MRA HEAD WITHOUT CONTRAST  TECHNIQUE: Multiplanar, multiecho pulse sequences of the brain and surrounding structures were obtained without intravenous contrast. Angiographic images of the head were obtained using MRA technique without contrast.  COMPARISON:  CT of the head August 17, 2014 at 1910 hours and MRI of the brain May 09, 2014  FINDINGS: MRI HEAD FINDINGS  No reduced diffusion to suggest acute ischemia. No susceptibility artifact to suggest hemorrhage. Stable mineralization RIGHT frontal lobe, a few scattered peripherally distributed foci of susceptibility artifact and, susceptibility artifact associated with known LEFT parafalcine vertex probable meningioma. Mild ex vacuo dilatation of LEFT greater than RIGHT occipital horns associated with encephalomalacia and gliosis. Confluence supratentorial and to lesser extent pontine white matter FLAIR T2 hyperintensities again noted. Small area of LEFT frontal and parietal convexity encephalomalacia. Tiny T2 hyperintensities seen in the basal ganglia and RIGHT thalamus were present previously.  No abnormal extra-axial fluid  collections. Trace bilateral mastoid effusions. Mild paranasal sinus mucosal thickening without air-fluid levels. Status post bilateral ocular lens implants. Mild sellar expansion with superimposed subcentimeter soft tissue mass along the anterior sella/clinoid possibly arising from the planum sphenoidale.  MRA HEAD FINDINGS  Anterior circulation: Normal flow related enhancement of the included cervical, petrous, cavernous and supra clinoid internal carotid arteries. Patent anterior communicating artery. Normal flow related enhancement of the anterior and middle cerebral arteries, including more distal segments. Mild stenosis mid RIGHT M2 segment. Mid high-grade stenosis of distal LEFT M3 segment.  No large vessel occlusion, high-grade stenosis, abnormal luminal irregularity, aneurysm.  Posterior circulation: Codominant vertebral arteries. Normal flow related enhancement of the vertebral arteries. Short-segment moderate stenosis of the proximal basilar artery, with mild poststenotic dilatation. Moderate luminal irregularity basilar artery likely reflects atherosclerosis. Tiny LEFT posterior communicating artery is present. Bilateral posterior cerebral arteries are patent. At least mild multifocal narrowing of LEFT P2 segment.  No large vessel occlusion, suspicious luminal irregularity, aneurysm.  IMPRESSION: MRI HEAD: No acute intracranial process, specifically no acute ischemia.  Remote bilateral posterior cerebral artery territory infarcts. LEFT frontoparietal encephalomalacia suggest remote LEFT middle cerebral artery territory infarct.  Moderate to severe chronic small vessel ischemic disease. Scattered micro hemorrhages likely reflect sequelae of chronic hypertension. Remote bilateral basal ganglia and RIGHT thalamus lacunar infarcts and or perivascular spaces.  Probable small meningioma LEFT frontal convexity in addition to subcentimeter suspected planum sphenoidale meningioma, these could be further  characterized with contrast-enhanced MRI if there is no contraindication.  MRA HEAD: No large vessel occlusion. Multifocal stenosis of the anterior and posterior circulation.  Moderate luminal irregularity of the posterior cerebral arteries likely represents chronic small vessel ischemic disease.   Electronically Signed   By: Awilda Metroourtnay  Bloomer   On: 08/18/2014 01:18   Mr Brain Wo Contrast  08/18/2014   CLINICAL DATA:  Sudden onset difficulty ambulating, gait instability today at doctor's office. Similar episode last week. History of hypertension, diabetes and neuropathy, chronic kidney disease, Alzheimer's, depression, tardive dyskinesia.  EXAM: MRI HEAD WITHOUT CONTRAST  MRA HEAD WITHOUT CONTRAST  TECHNIQUE: Multiplanar, multiecho pulse sequences of the brain and surrounding structures were obtained without intravenous contrast. Angiographic images of the head were obtained using MRA technique without contrast.  COMPARISON:  CT of the head August 17, 2014 at 1910 hours and MRI of the brain May 09, 2014  FINDINGS: MRI HEAD FINDINGS  No reduced diffusion to suggest acute ischemia. No susceptibility artifact to suggest hemorrhage. Stable mineralization RIGHT frontal lobe, a few scattered peripherally distributed foci of susceptibility artifact and, susceptibility artifact associated with known LEFT parafalcine vertex probable meningioma. Mild ex vacuo dilatation of LEFT greater than RIGHT occipital horns associated with encephalomalacia and gliosis. Confluence supratentorial and to lesser extent pontine white matter FLAIR T2 hyperintensities again noted. Small area of LEFT frontal and parietal convexity encephalomalacia. Tiny T2 hyperintensities seen in the basal ganglia and RIGHT thalamus were present previously.  No abnormal extra-axial fluid collections. Trace bilateral mastoid effusions. Mild paranasal sinus mucosal thickening without air-fluid levels. Status post bilateral ocular lens implants. Mild sellar  expansion with superimposed subcentimeter soft tissue mass along the anterior sella/clinoid possibly arising from the planum sphenoidale.  MRA HEAD FINDINGS  Anterior circulation: Normal flow related enhancement of the included cervical, petrous, cavernous and supra clinoid internal carotid arteries. Patent anterior communicating artery. Normal flow related enhancement of the anterior and middle cerebral arteries, including more distal segments. Mild stenosis mid RIGHT M2 segment. Mid high-grade stenosis of distal LEFT M3 segment.  No large vessel occlusion, high-grade stenosis, abnormal luminal irregularity, aneurysm.  Posterior circulation: Codominant vertebral arteries. Normal flow related enhancement of the vertebral arteries. Short-segment moderate stenosis of the proximal basilar artery, with mild poststenotic dilatation. Moderate luminal irregularity basilar artery likely reflects atherosclerosis. Tiny LEFT posterior communicating artery is present. Bilateral posterior cerebral arteries are patent. At least mild multifocal narrowing of LEFT P2 segment.  No large vessel occlusion, suspicious luminal irregularity, aneurysm.  IMPRESSION: MRI HEAD: No acute intracranial process, specifically no acute ischemia.  Remote bilateral posterior cerebral artery territory infarcts. LEFT frontoparietal encephalomalacia suggest remote LEFT middle cerebral artery territory infarct.  Moderate to severe chronic small vessel ischemic disease. Scattered micro hemorrhages likely reflect sequelae of chronic hypertension. Remote bilateral basal ganglia and RIGHT thalamus lacunar infarcts and or perivascular spaces.  Probable small meningioma LEFT frontal convexity in addition to subcentimeter suspected planum sphenoidale meningioma, these could be further  characterized with contrast-enhanced MRI if there is no contraindication.  MRA HEAD: No large vessel occlusion. Multifocal stenosis of the anterior and posterior circulation.   Moderate luminal irregularity of the posterior cerebral arteries likely represents chronic small vessel ischemic disease.   Electronically Signed   By: Awilda Metro   On: 08/18/2014 01:18    2D Echo:  - Left ventricle: The cavity size was normal. Wall thickness was increased in a pattern of mild LVH. There was mild focal basal hypertrophy of the septum. Systolic function was normal. The estimated ejection fraction was in the range of 55% to 60%. Wall motion was normal; there were no regional wall motion abnormalities. Doppler parameters are consistent with abnormal left ventricular relaxation (grade 1 diastolic dysfunction). - Mitral valve: Calcified annulus. Mildly thickened leaflets .  Carotid Doppler: 1-39% ICA stenosis. Vertebral artery flow is antegrade.     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 She reports being at her doctor's office today when she suddenly had difficulty ambulating and felt as if she was going to fall. She says that she felt as if her legs were both weak, and she was unsteady on her feet. She reports 2 similar episodes over the last week (yesterday and Friday). Her episode today was associated with some slurred speech and lasted approximately 5 minutes and completely resolved. Her daughter-in-law reports that her ataxia was associated with a facial droop, but she is unsure which side it was on. The patient also reports intermittent chest pain over the last week. She has had an episode last night that made it difficult for her to sleep. Her chest pain resolved after taking antacids, and she does report a history of GERD. She also reports a bilateral frontal headache  currently. She has a history of abnormal gait which was evaluated by brain MRI in December 2015, which showed no acute infarct but remote bilateral occipital lobe, frontal, and bilateral basal ganglia infarcts.  In the ER, the patient was intermittently tachycardic to 131 and neurology was consulted and recommended full TIA workup.  Hospital Course by problem list: Principal Problem:   TIA (transient ischemic attack) Active Problems:   Acute-on-chronic kidney injury   Anemia of chronic disease   Alzheimer's disease   Atypical chest pain   Ataxia   #Transient ischemic attack Ms Garmany admitted with neuro recommending TIA work-up. MRI notes no acute process but remote b/l posterior cerebral artery infarcts, L frontoparietal encephalomalacia suggesting remote L MCA infarct, remote b/l basal ganglia and R thalamus lacunar infarcts, and probably small meningioma L frontal convexity but cannot get contrast-enhanced MRI due to renal function. She has risk factors given her previous stroke and type 2 diabetes which is controlled with A1c returning 6.8. Prelim carotid dopplers negative, TTE with EF 55-60%, grade 1 diastolic dysfunction. She was started on ASA 325 mg daily. Her statin was held due to elevated CK which resolved when held (see below). She gets PT/OT from [facility] but PT/OT recommended home health.  #Orthostatic Hypotension: Ms Raney had witnessed episode of orthostatic hypotension v vasovagal yesterday where she became unresponsive on the toilet after having a bowel movement. She had repeat EKG which was unchanged, CBG was 134, VSS, neuro exam unchanged. Neurology saw her and also  believe this could be orthostasis. She was given a 500 cc bolus of NS with some improvement. The day of discharge she was working with OT and she was orthostatic. She was educated several times on the importance of changing positions slowly and with supervision. She was given several boluses of normal  saline was as IV infusion. Her orthostatics prior to discharge had improved greatly to 130/75 with HR 86 sitting to 132/62 with HR 77 standing after 3 minutes. 500 cc NS bolus and then overnight NS at 75 cc/hr. Her antipsychotic medications could be contributing and prior to discharge her remeron was decreased from 30 mg to 15 mg and risperidone 2 mg bid to 1 mg bid. PCP should consider decreasing these. She is to have home health PT/OT.  #Type 2 diabetes Glucose normal on presentation. No hemoglobin A1c in our system so checked at 6.8. Reported history of neuropathy and nephropathy. Not on any medications currently. Blood glucoses well controlled here without medicine.   #Chronic kidney disease Creatinine on presentation 1.44 down from 2.3 3 years ago.Prior to discharge with hydration to 1.19.  #Anemia Hemoglobin 10.9 on presentation up from 8.8 2 years ago, now 9.9 and stable prior to discharge. No prior anemia panel in our system. Anemia panel suggestive of iron deficiency with ferritin of 24. Started ferrous sulfate 325 mg po daily.  #Hypertension She is on amlodipine 10 mg daily and lisinopril 20 mg daily at home. These were held initially due to TIA work-up. Lisinopril was resumed at 10 mg as half home dose. PCP should reassess and then decide whether these should be resumed.  #Hypothyroidism TSH 1.46 on presentation. She was continued on home Synthroid 25 g daily.  #GERD She was continued on home Protonix 40 mg twice a day.  #Hyperlipidemia CK slightly elevated at 197 now 126 this morning after holding home pravastatin 40 mg daily. PCP should decide whether to resume statin.  #Alzheimer's disease/depression On risperidone 2 mg twice a day with history of tardive dyskinesia that is persistent. Also takes mirtazapine, zolpidem, Ambien, and donepezil at home. Concern that this is making her somnolent in the morning and also could contribute to her orthostatic hypotension. Do not want to  make too many changes at once but PCP should reassess. We decreased her mirtazapine from 30 mg to 15 mg qhs and risperidone from 2 mg bid to 1 mg in am, 0.5 mg in pm. We ask PCP to consider reassessing psychiatric medications. We also held her home aricept and [place]. We continued her home escitalopram 20 mg daily.  Discharge Vitals:   BP 136/65 mmHg  Pulse 81  Temp(Src) 98.3 F (36.8 C) (Oral)  Resp 20  Ht 5\' 1"  (1.549 m)  Wt 120 lb 12.8 oz (54.795 kg)  BMI 22.84 kg/m2  SpO2 97%  Discharge Physical Exam: Gen: No acute distress, sitting up in bed, well developed, well nourished HEENT: Atraumatic, PERRL, EOMI, sclerae anicteric, moist mucous membranes Heart: Regular rate and rhythm, normal S1 S2, no murmurs, rubs, or gallops Lungs: Clear to auscultation bilaterally, respirations unlabored Abd: Soft, non-tender, non-distended, + bowel sounds, no hepatosplenomegaly Ext: No edema or cyanosis Neuro: A&O x 3, CN II-XII intact, strength 5/5 and symmetric in all four extremities, sensation grossly intact  Discharge Labs:  Basic Metabolic Panel:  Recent Labs Lab 08/19/14 0550 08/20/14 0337  NA 145 145  K 3.9 3.7  CL 116* 117*  CO2 21 21  GLUCOSE 97 90  BUN 16 14  CREATININE 1.20* 1.19*  CALCIUM 8.5 8.5   Liver Function Tests:  Recent Labs Lab 08/17/14 1714  AST 21  ALT 11  ALKPHOS 69  BILITOT 0.7  PROT 6.6  ALBUMIN 3.8   CBC:  Recent Labs Lab 08/17/14 1714  08/19/14 0550 08/20/14 0337  WBC 5.3  < > 4.5 4.2  NEUTROABS 3.5  --   --   --   HGB 10.9*  < > 9.8* 9.9*  HCT 32.2*  < > 29.1* 29.6*  MCV 89.4  < > 90.4 90.2  PLT 179  < > 148* 162  < > = values in this interval not displayed. Cardiac Enzymes:  Recent Labs Lab 08/17/14 1714 08/18/14 0610 08/18/14 1317 08/19/14 0550  CKTOTAL 197* 154  --  126  TROPONINI  --  <0.03 <0.03  --    CBG:  Recent Labs Lab 08/19/14 2147 08/20/14 1125 08/20/14 1605 08/20/14 2152 08/21/14 0706 08/21/14 1113  GLUCAP  185* 103* 107* 103* 140* 83  Fasting Lipid Panel:  Recent Labs Lab 08/18/14 0610  CHOL 208*  HDL 75  LDLCALC 113*  TRIG 100  CHOLHDL 2.8   Thyroid Function Tests:  Recent Labs Lab 08/17/14 2226  TSH 1.459   Coagulation:  Recent Labs Lab 08/17/14 1714  LABPROT 11.9  INR 0.87   Anemia Panel:  Recent Labs Lab 08/18/14 1317  VITAMINB12 299  FOLATE 10.6  FERRITIN 24  TIBC 270  IRON 38*  RETICCTPCT 1.5   Urine Drug Screen: Drugs of Abuse     Component Value Date/Time   LABOPIA NONE DETECTED 08/17/2014 1710   COCAINSCRNUR NONE DETECTED 08/17/2014 1710   LABBENZ NONE DETECTED 08/17/2014 1710   AMPHETMU NONE DETECTED 08/17/2014 1710   THCU NONE DETECTED 08/17/2014 1710   LABBARB NONE DETECTED 08/17/2014 1710    Alcohol Level:  Recent Labs Lab 08/17/14 1714  ETH <5   Urinalysis:  Recent Labs Lab 08/17/14 1710  COLORURINE YELLOW  LABSPEC 1.005  PHURINE 6.5  GLUCOSEU NEGATIVE  HGBUR NEGATIVE  BILIRUBINUR NEGATIVE  KETONESUR NEGATIVE  PROTEINUR NEGATIVE  UROBILINOGEN 0.2  NITRITE NEGATIVE  LEUKOCYTESUR SMALL*    Signed: Lorenda Hatchet, MD 08/21/2014, 11:25 AM    Services Ordered on Discharge: HHPT, HHOT Equipment Ordered on Discharge: none

## 2014-08-19 NOTE — Evaluation (Signed)
Speech Language Pathology Evaluation Patient Details Name: Norma Neal MRN: [id number] DOB: 04/17/1941 Today's Date: 08/19/2014 Time: [id number] SLP Time Calculation (min) (ACUTE ONLY): 18 min  Problem List:  Patient Active Problem List   Diagnosis Date Noted  . Alzheimer's disease 08/18/2014  . Hypothyroidism 08/18/2014  . Atypical chest pain 08/18/2014  . Ataxia   . Other specified transient cerebral ischemias   . CKD (chronic kidney disease) stage 3, GFR 30-59 ml/min   . TIA (transient ischemic attack) 08/17/2014  . Acute-on-chronic kidney injury 08/17/2014  . Anemia of chronic disease 08/17/2014  . Elevated CK 08/17/2014   Past Medical History:  Past Medical History  Diagnosis Date  . Hypertension   . Diabetes mellitus   . Dementia   . Alzheimer's disease   . Anemia of other chronic disease   . Chronic obstructive asthma, unspecified   . Esophageal reflux   . Unspecified hypothyroidism   . Nontoxic multinodular goiter   . 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 Unspecified vitamin D deficiency   . Psychotic depression   . Insomnia   . Drug induced akathisia   . Diabetic neuropathy   . Chronic kidney disease (CKD), stage III (moderate)   . Lichen planus    Past Surgical History:  Past Surgical History  Procedure Laterality Date  . Abdominal hysterectomy  yrs ago    ovaries also  . Knee surgery Left yrs ago  . Partial thryoidectomy  yrs ago  . Esophagogastroduodenoscopy (egd) with propofol N/A 03/31/2014    Procedure: ESOPHAGOGASTRODUODENOSCOPY (EGD) WITH PROPOFOL;  Surgeon: Rachael Feeaniel P Jacobs, MD;  Location: [facility];  Service: Endoscopy;  Laterality: N/A;   HPI:  74 y.o. female hx of alzheimers, tardive dyskinesia, presenting for evaluation of transient episodes of gait instability and slurred speech. MRI negative for acute infarct.   Assessment / Plan / Recommendation Clinical Impression  Pt requires Min-Mod cues for sustained attention, basic problem  solving, and recall of daily events and functional information. She is often inconsistent with responses to questions regarding home situation. With more complex or abstract information, she requires Max A for completion of structured tasks. No family is present to discuss baseline level of function, however PT spoke with [facility]ACE worker earlier, who stated that pt previously required some assistance with initiation only, and is currently altered from her baseline mentation. Pt will benefit from skilled SLP services to maximize functional communication and independence. [facility].    SLP Assessment  Patient needs continued Speech Lanaguage Pathology Services    Follow Up Recommendations  Home health SLP;24 hour supervision/assistance    Frequency and Duration min 2x/week  2 weeks   Pertinent Vitals/Pain Pain Assessment: No/denies pain   SLP Goals  Patient/Family Stated Goal: none stated Potential to Achieve Goals (ACUTE ONLY): Fair Potential Considerations (ACUTE ONLY): Other (comment) (h/o alzheimers)  SLP Evaluation Prior Functioning  Cognitive/Linguistic Baseline: Baseline deficits Baseline deficit details: pt lives with daughter and son-in-law, [facility] and on Fridays has a HH aid that comes into her home; says that she occasionally has to take medications on her own but does not cook, clean, or manage finances Type of Home: House  Lives With: Daughter;Other (Comment) (son-in-law) Available Help at Discharge: Family;Other (Comment) (PACE/home aid)   Cognition  Overall Cognitive Status: Impaired/Different from baseline Arousal/Alertness: Awake/alert Orientation Level: Oriented to person;Oriented to place;Oriented to situation;Disoriented to time Attention: Sustained Sustained Attention: Impaired Sustained  Attention Impairment: Functional basic Memory: Impaired Memory Impairment: Decreased recall of new  information Awareness: Impaired Awareness Impairment: Intellectual impairment;Emergent impairment;Anticipatory impairment Problem Solving: Impaired Problem Solving Impairment: Functional basic Safety/Judgment: Impaired    Comprehension  Auditory Comprehension Overall Auditory Comprehension: Impaired Yes/No Questions: Not tested Commands: Impaired One Step Basic Commands: 75-100% accurate Two Step Basic Commands: 75-100% accurate Complex Commands: 25-49% accurate Conversation: Simple EffectiveTechniques: Extra processing time;Repetition Reading Comprehension Reading Status: Not tested    Expression Expression Primary Mode of Expression: Verbal Verbal Expression Overall Verbal Expression: Impaired Naming: Impairment Confrontation: Impaired (0% accuracy with MOCA subtest) Pragmatics: Impairment Impairments: Abnormal affect Non-Verbal Means of Communication: Not applicable   Oral / Motor Motor Speech Overall Motor Speech: Appears within functional limits for tasks assessed (although low in intensity)    Maxcine Ham, M.A. CCC-SLP [phone]  Maxcine Ham 08/19/2014, 2:27 PM

## 2014-08-19 NOTE — Progress Notes (Signed)
UR completed 

## 2014-08-19 NOTE — Progress Notes (Signed)
Subjective: Norma Neal had likely vasovagal episode yesterday and was seen by night MD team (see separate notes). This morning she was sleeping in bed and while arousable, was very drowsy. She had no complaints. I returned early afternoon and she was sitting up in bed eating lunch, back to baseline level of alertness. She said she got dizzy working with OT today but was feeling better. She was educated about changing positions slowly to avoid symptoms.  Objective: Vital signs in last 24 hours: Filed Vitals:   08/19/14 0000 08/19/14 0200 08/19/14 0556 08/19/14 0916  BP: 139/69 137/87 157/91 134/62  Pulse: 84  88 92  Temp:  98.2 F (36.8 C) 98.8 F (37.1 C) 98.1 F (36.7 C)  TempSrc:  Oral Oral Oral  Resp: Height:      Weight:      SpO2: 97% 100% 99% 98%   Weight change:   Intake/Output Summary (Last 24 hours) at 08/19/14 1306 Last data filed at 08/19/14 0918  Gross per 24 hour  Intake    120 ml  Output      0 ml  Net    120 ml   Gen: No acute distress, sitting up in bed eating lunch, well developed, well nourished HEENT: Atraumatic, PERRL, EOMI, sclerae anicteric, moist mucous membranes Heart: Regular rate and rhythm, normal S1 S2, no murmurs, rubs, or gallops Lungs: Clear to auscultation bilaterally, respirations unlabored Abd: Soft, non-tender, non-distended, + bowel sounds, no hepatosplenomegaly Ext: No edema or cyanosis Neuro: A&O x 3, CN II-XII intact, strength 5/5 and symmetric in all extremities, sensation grossly intact, no Babinkski sign b/l  Lab Results: Basic Metabolic Panel:  Recent Labs Lab 08/18/14 0610 08/19/14 0550  NA 146* 145  K 3.3* 3.9  CL 113* 116*  CO2 24 21  GLUCOSE 92 97  BUN 17 16  CREATININE 1.40* 1.20*  CALCIUM 9.2 8.5   Liver Function Tests:  Recent Labs Lab 08/17/14 1714  AST 21  ALT 11  ALKPHOS 69  BILITOT 0.7  PROT 6.6  ALBUMIN 3.8   CBC:  Recent Labs Lab 08/17/14 1714  08/18/14 0610 08/19/14 0550  WBC  5.3  --  4.5 4.5  NEUTROABS 3.5  --   --   --   HGB 10.9*  < > 10.3* 9.8*  HCT 32.2*  < > 30.2* 29.1*  MCV 89.4  --  90.1 90.4  PLT 179  --  165 148*  < > = values in this interval not displayed. Cardiac Enzymes:  Recent Labs Lab 08/17/14 1714 08/18/14 0610 08/18/14 1317 08/19/14 0550  CKTOTAL 197* 154  --  126  TROPONINI  --  <0.03 <0.03  --    CBG:  Recent Labs Lab 08/18/14 1132 08/18/14 1252 08/18/14 1614 08/18/14 2212 08/19/14 0634 08/19/14 1108  GLUCAP 104* 136* 122* 96 88 81   Fasting Lipid Panel:  Recent Labs Lab 08/18/14 0610  CHOL 208*  HDL 75  LDLCALC 113*  TRIG 100  CHOLHDL 2.8   Thyroid Function Tests:  Recent Labs Lab 08/17/14 2226  TSH 1.459   Coagulation:  Recent Labs Lab 08/17/14 1714  LABPROT 11.9  INR 0.87   Urine Drug Screen: Drugs of Abuse     Component Value Date/Time   LABOPIA NONE DETECTED 08/17/2014 1710   COCAINSCRNUR NONE DETECTED 08/17/2014 1710   LABBENZ NONE DETECTED 08/17/2014 1710   AMPHETMU NONE DETECTED 08/17/2014 1710   THCU NONE DETECTED 08/17/2014 1710  LABBARB NONE DETECTED 08/17/2014 1710    Alcohol Level:  Recent Labs Lab 08/17/14 1714  ETH <5   Urinalysis:  Recent Labs Lab 08/17/14 1710  COLORURINE YELLOW  LABSPEC 1.005  PHURINE 6.5  GLUCOSEU NEGATIVE  HGBUR NEGATIVE  BILIRUBINUR NEGATIVE  KETONESUR NEGATIVE  PROTEINUR NEGATIVE  UROBILINOGEN 0.2  NITRITE NEGATIVE  LEUKOCYTESUR SMALL*   Micro Results: No results found for this or any previous visit (from the past 240 hour(s)). Studies/Results: Ct Head Wo Contrast  08/17/2014   CLINICAL DATA:  74 year old female with a history of transient ischemic attack.  EXAM: CT HEAD WITHOUT CONTRAST  TECHNIQUE: Contiguous axial images were obtained from the base of the skull through the vertex without intravenous contrast.  COMPARISON:  MR brain 05/09/2014  FINDINGS: Unremarkable appearance of the calvarium without acute fracture or aggressive  lesion.  Unremarkable appearance of the scalp soft tissues.  Unremarkable appearance of the bilateral orbits.  Mastoid air cells are clear.  No significant paranasal sinus disease  No acute intracranial hemorrhage.  No midline shift or mass effect.  Re- demonstration of encephalomalacia changes of bilateral occipital region, present on prior MR. Confluent hypodensity in the periventricular white matter, similar distribution to the prior MRI.  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 This has not significantly changed in size since the MR.  Gray-white differentiation maintained.  Intracranial atherosclerotic calcifications of the anterior circulation.  IMPRESSION: No CT evidence of acute intracranial abnormality.  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  Periventricular white matter disease, compatible with small vessel disease. Intracranial atherosclerosis.  Signed,  Yvone Neu. Loreta Ave, DO  [facility]  [facility]   Electronically Signed   By: Gilmer Mor D.O.   On: 08/17/2014 20:00   Mr [name] Head Wo Contrast  08/18/2014   CLINICAL DATA:  Sudden onset difficulty ambulating, gait instability today at doctor's office. Similar episode last week. History of hypertension, diabetes and neuropathy, chronic kidney disease, Alzheimer's, depression, tardive dyskinesia.  EXAM: MRI HEAD WITHOUT CONTRAST  MRA HEAD WITHOUT CONTRAST  TECHNIQUE: Multiplanar, multiecho pulse sequences of the brain and surrounding structures were obtained without intravenous contrast. Angiographic images of the head were obtained using MRA technique without contrast.  COMPARISON:  CT of the head August 17, 2014 at 1910 hours and MRI of the brain May 09, 2014  FINDINGS: MRI HEAD FINDINGS  No reduced diffusion to suggest acute ischemia. No susceptibility artifact to suggest hemorrhage. Stable mineralization RIGHT frontal  lobe, a few scattered peripherally distributed foci of susceptibility artifact and, susceptibility artifact associated with known LEFT parafalcine vertex probable meningioma. Mild ex vacuo dilatation of LEFT greater than RIGHT occipital horns associated with encephalomalacia and gliosis. Confluence supratentorial and to lesser extent pontine white matter FLAIR T2 hyperintensities again noted. Small area of LEFT frontal and parietal convexity encephalomalacia. Tiny T2 hyperintensities seen in the basal ganglia and RIGHT thalamus were present previously.  No abnormal extra-axial fluid collections. Trace bilateral mastoid effusions. Mild paranasal sinus mucosal thickening without air-fluid levels. Status post bilateral ocular lens implants. Mild sellar expansion with superimposed subcentimeter soft tissue mass along the anterior sella/clinoid possibly arising from the planum sphenoidale.  MRA HEAD FINDINGS  Anterior circulation: Normal flow related enhancement of the included cervical, petrous, cavernous and supra clinoid internal carotid arteries. Patent anterior communicating artery. Normal flow related enhancement of the anterior and middle cerebral arteries, including more distal segments. Mild stenosis mid RIGHT  M2 segment. Mid high-grade stenosis of distal LEFT M3 segment.  No large vessel occlusion, high-grade stenosis, abnormal luminal irregularity, aneurysm.  Posterior circulation: Codominant vertebral arteries. Normal flow related enhancement of the vertebral arteries. Short-segment moderate stenosis of the proximal basilar artery, with mild poststenotic dilatation. Moderate luminal irregularity basilar artery likely reflects atherosclerosis. Tiny LEFT posterior communicating artery is present. Bilateral posterior cerebral arteries are patent. At least mild multifocal narrowing of LEFT P2 segment.  No large vessel occlusion, suspicious luminal irregularity, aneurysm.  IMPRESSION: MRI HEAD: No acute  intracranial process, specifically no acute ischemia.  Remote bilateral posterior cerebral artery territory infarcts. LEFT frontoparietal encephalomalacia suggest remote LEFT middle cerebral artery territory infarct.  Moderate to severe chronic small vessel ischemic disease. Scattered micro hemorrhages likely reflect sequelae of chronic hypertension. Remote bilateral basal ganglia and RIGHT thalamus lacunar infarcts and or perivascular spaces.  Probable small meningioma LEFT frontal convexity in addition to subcentimeter suspected planum sphenoidale meningioma, these could be further characterized with contrast-enhanced MRI if there is no contraindication.  MRA HEAD: No large vessel occlusion. Multifocal stenosis of the anterior and posterior circulation.  Moderate luminal irregularity of the posterior cerebral arteries likely represents chronic small vessel ischemic disease.   Electronically Signed   By: Awilda Metro   On: 08/18/2014 01:18   Mr Brain Wo Contrast  08/18/2014   CLINICAL DATA:  Sudden onset difficulty ambulating, gait instability today at doctor's office. Similar episode last week. History of hypertension, diabetes and neuropathy, chronic kidney disease, Alzheimer's, depression, tardive dyskinesia.  EXAM: MRI HEAD WITHOUT CONTRAST  MRA HEAD WITHOUT CONTRAST  TECHNIQUE: Multiplanar, multiecho pulse sequences of the brain and surrounding structures were obtained without intravenous contrast. Angiographic images of the head were obtained using MRA technique without contrast.  COMPARISON:  CT of the head August 17, 2014 at 1910 hours and MRI of the brain May 09, 2014  FINDINGS: MRI HEAD FINDINGS  No reduced diffusion to suggest acute ischemia. No susceptibility artifact to suggest hemorrhage. Stable mineralization RIGHT frontal lobe, a few scattered peripherally distributed foci of susceptibility artifact and, susceptibility artifact associated with known LEFT parafalcine vertex probable  meningioma. Mild ex vacuo dilatation of LEFT greater than RIGHT occipital horns associated with encephalomalacia and gliosis. Confluence supratentorial and to lesser extent pontine white matter FLAIR T2 hyperintensities again noted. Small area of LEFT frontal and parietal convexity encephalomalacia. Tiny T2 hyperintensities seen in the basal ganglia and RIGHT thalamus were present previously.  No abnormal extra-axial fluid collections. Trace bilateral mastoid effusions. Mild paranasal sinus mucosal thickening without air-fluid levels. Status post bilateral ocular lens implants. Mild sellar expansion with superimposed subcentimeter soft tissue mass along the anterior sella/clinoid possibly arising from the planum sphenoidale.  MRA HEAD FINDINGS  Anterior circulation: Normal flow related enhancement of the included cervical, petrous, cavernous and supra clinoid internal carotid arteries. Patent anterior communicating artery. Normal flow related enhancement of the anterior and middle cerebral arteries, including more distal segments. Mild stenosis mid RIGHT M2 segment. Mid high-grade stenosis of distal LEFT M3 segment.  No large vessel occlusion, high-grade stenosis, abnormal luminal irregularity, aneurysm.  Posterior circulation: Codominant vertebral arteries. Normal flow related enhancement of the vertebral arteries. Short-segment moderate stenosis of the proximal basilar artery, with mild poststenotic dilatation. Moderate luminal irregularity basilar artery likely reflects atherosclerosis. Tiny LEFT posterior communicating artery is present. Bilateral posterior cerebral arteries are patent. At least mild multifocal narrowing of LEFT P2 segment.  No large vessel occlusion, suspicious luminal irregularity, aneurysm.  IMPRESSION: MRI  HEAD: No acute intracranial process, specifically no acute ischemia.  Remote bilateral posterior cerebral artery territory infarcts. LEFT frontoparietal encephalomalacia suggest remote  LEFT middle cerebral artery territory infarct.  Moderate to severe chronic small vessel ischemic disease. Scattered micro hemorrhages likely reflect sequelae of chronic hypertension. Remote bilateral basal ganglia and RIGHT thalamus lacunar infarcts and or perivascular spaces.  Probable small meningioma LEFT frontal convexity in addition to subcentimeter suspected planum sphenoidale meningioma, these could be further characterized with contrast-enhanced MRI if there is no contraindication.  MRA HEAD: No large vessel occlusion. Multifocal stenosis of the anterior and posterior circulation.  Moderate luminal irregularity of the posterior cerebral arteries likely represents chronic small vessel ischemic disease.   Electronically Signed   By: Awilda Metro   On: 08/18/2014 01:18   Prelim carotid doppler: 1-39% ICA stenosis. Vertebral artery flow is antegrade.   Medications: I have reviewed the patient's current medications. Scheduled Meds: .  stroke: mapping our early stages of recovery book   Does not apply Once  . aspirin  325 mg Oral Daily  . escitalopram  20 mg Oral Daily  . heparin  5,000 Units Subcutaneous 3 times per day  . levothyroxine  25 mcg Oral QAC breakfast  . mirtazapine  30 mg Oral QHS  . pantoprazole  40 mg Oral BID AC  . pravastatin  40 mg Oral q1800  . risperiDONE  2 mg Oral BID  . sodium chloride  500 mL Intravenous Once   Continuous Infusions:  PRN Meds:. Assessment/Plan: Principal Problem:   TIA (transient ischemic attack) Active Problems:   Acute-on-chronic kidney injury   Anemia of chronic disease   Elevated CK   Alzheimer's disease   Hypothyroidism   Atypical chest pain   Ataxia   Other specified transient cerebral ischemias   CKD (chronic kidney disease) stage 3, GFR 30-59 ml/min  #Transient ischemic attack Norma Neal admitted with neuro recommending TIA work-up. MRI notes no acute process but remote b/l posterior cerebral artery infarcts, L frontoparietal  encephalomalacia suggesting remote L MCA infarct, remote b/l basal ganglia and R thalamus lacunar infarcts, and probably small meningioma L frontal convexity but cannot get contrast-enhanced MRI due to renal function. She has risk factors given her previous stroke and type 2 diabetes. Prelim carotid dopplers negative, TTE obtained but await read. -appreciate neuro -f/u hemoglobin A1c -f/u Echocardiogram -f/u final Carotid Dopplers. -Aspirin 325 mg daily - hold on statin as CK was minimally elevated -PT/OT/SLP recommend home health -Neurochecks - telemetry  #Orthostatic Hypotension: Norma Neal had episode of orthostatic hypotension v vasovagal yesterday where she became unresponsive on the toilet after having a bowel movement. She was given a 500 cc bolus of NS with some improvement. Today working with OT she was orthostatic.  -500 cc NS then recheck orthostatics  #Type 2 diabetes Glucose normal on presentation. No hemoglobin A1c in our system. Reported history of neuropathy and nephropathy. Not on any medications currently. AM CBG 88. -f/u hemoglobin A1c. -CBGs before meals and at bedtime.  #Chronic kidney disease Creatinine on presentation 1.44 down from 2.3 3 years ago. This morning decreased with hydration to 1.20 -Trend creatinine.  #Anemia Hemoglobin 10.9 on presentation up from 8.8 2 years ago, now 9.8. No prior anemia panel in our system. -Trend hemoglobin. -Check anemia panel.  #Hypertension She is on amlodipine 10 mg daily and lisinopril 20 mg daily at home. -Held home hypertensive medications in setting of TIA, now for orthostatic hypotension  #Hypothyroidism TSH 1.46 on presentation -  Continue home Synthroid 25 g daily.  #GERD -Continue home Protonix 40 mg twice a day.  #Hyperlipidemia CK slightly elevated at 197 now 126 this morning -Hold home pravastatin 40 mg daily for now.  #Alzheimer's disease/depression On risperidone 2 mg twice a day with history of  tardive dyskinesia that is persistent. Also takes mirtazapine, zolpidem, Ambien, and donepezil at home. Concern that this is making her somnolent in the morning and also could contribute to her orthostatic hypotension. Do not want to make too many changes at once but PCP should reassess. -Continue home escitalopram 20 g daily. -Continue home Risperdal 2 mg twice a day. -Decrease home mirtazapine 30 mg qhs to mirtazapine 15 mg qhs -Hold home Ambien. -Hold home Aricept  Dispo: Disposition is deferred at this time, awaiting improvement of current medical problems.  Anticipated discharge in approximately 1 day(s).   The patient does have a current PCP Jethro Bastos(Robert N Koehler, MD) and does need [facility] after discharge.  The patient does not know have transportation limitations that hinder transportation to clinic appointments.  .Services Needed at time of discharge: Y = Yes, Blank = No PT: Y  OT: Y  RN:   Equipment:   Other:       Lorenda HatchetAdam L Josy Peaden, MD 08/19/2014, 1:06 PM

## 2014-08-19 NOTE — Progress Notes (Signed)
Bilateral carotid artery duplex completed:  1-39% ICA stenosis.  Vertebral artery flow is antegrade.     

## 2014-08-20 DIAGNOSIS — E1122 Type 2 diabetes mellitus with diabetic chronic kidney disease: Secondary | ICD-10-CM | POA: Diagnosis not present

## 2014-08-20 DIAGNOSIS — I951 Orthostatic hypotension: Secondary | ICD-10-CM | POA: Diagnosis not present

## 2014-08-20 DIAGNOSIS — N183 Chronic kidney disease, stage 3 (moderate): Secondary | ICD-10-CM | POA: Diagnosis not present

## 2014-08-20 DIAGNOSIS — I129 Hypertensive chronic kidney disease with stage 1 through stage 4 chronic kidney disease, or unspecified chronic kidney disease: Secondary | ICD-10-CM | POA: Diagnosis not present

## 2014-08-20 LAB — BASIC METABOLIC PANEL
ANION GAP: 7 (ref 5–15)
BUN: 14 mg/dL (ref 6–23)
CALCIUM: 8.5 mg/dL (ref 8.4–10.5)
CO2: 21 mmol/L (ref 19–32)
CREATININE: 1.19 mg/dL — AB (ref 0.50–1.10)
Chloride: 117 mmol/L — ABNORMAL HIGH (ref 96–112)
GFR calc Af Amer: 51 mL/min — ABNORMAL LOW (ref >90)
GFR, EST NON AFRICAN AMERICAN: 44 mL/min — AB (ref >90)
Glucose, Bld: 90 mg/dL (ref 70–99)
Potassium: 3.7 mmol/L (ref 3.5–5.1)
SODIUM: 145 mmol/L (ref 135–145)

## 2014-08-20 LAB — CBC
HEMATOCRIT: 29.6 % — AB (ref 36.0–46.0)
Hemoglobin: 9.9 g/dL — ABNORMAL LOW (ref 12.0–15.0)
MCH: 30.2 pg (ref 26.0–34.0)
MCHC: 33.4 g/dL (ref 30.0–36.0)
MCV: 90.2 fL (ref 78.0–100.0)
Platelets: 162 10*3/uL (ref 150–400)
RBC: 3.28 MIL/uL — ABNORMAL LOW (ref 3.87–5.11)
RDW: 14 % (ref 11.5–15.5)
WBC: 4.2 10*3/uL (ref 4.0–10.5)

## 2014-08-20 LAB — GLUCOSE, CAPILLARY
GLUCOSE-CAPILLARY: 103 mg/dL — AB (ref 70–99)
Glucose-Capillary: 107 mg/dL — ABNORMAL HIGH (ref 70–99)
Glucose-Capillary: 103 mg/dL — ABNORMAL HIGH (ref 70–99)

## 2014-08-20 LAB — HEMOGLOBIN A1C
HEMOGLOBIN A1C: 6.8 % — AB (ref 4.8–5.6)
Hgb A1c MFr Bld: 6.1 % — ABNORMAL HIGH (ref 4.8–5.6)
Mean Plasma Glucose: 128 mg/dL
Mean Plasma Glucose: 148 mg/dL

## 2014-08-20 MED ORDER — RISPERIDONE 0.5 MG PO TBDP
0.5000 mg | ORAL_TABLET | Freq: Every day | ORAL | Status: DC
  Filled 2014-08-20: qty 1

## 2014-08-20 MED ORDER — LISINOPRIL 10 MG PO TABS
10.0000 mg | ORAL_TABLET | Freq: Every day | ORAL | Status: DC
  Administered 2014-08-20 – 2014-08-21 (×2): 10 mg via ORAL
  Filled 2014-08-20 (×2): qty 1

## 2014-08-20 MED ORDER — DOCUSATE SODIUM 100 MG PO CAPS
100.0000 mg | ORAL_CAPSULE | Freq: Two times a day (BID) | ORAL | Status: DC
  Administered 2014-08-20 – 2014-08-21 (×2): 100 mg via ORAL
  Filled 2014-08-20 (×3): qty 1

## 2014-08-20 MED ORDER — RISPERIDONE 0.5 MG PO TBDP
1.0000 mg | ORAL_TABLET | Freq: Every day | ORAL | Status: DC
  Administered 2014-08-21: 1 mg via ORAL
  Filled 2014-08-20: qty 2

## 2014-08-20 MED ORDER — AMLODIPINE BESYLATE 10 MG PO TABS
10.0000 mg | ORAL_TABLET | Freq: Every day | ORAL | Status: DC
  Administered 2014-08-20: 10 mg via ORAL
  Filled 2014-08-20: qty 1

## 2014-08-20 NOTE — Progress Notes (Signed)
Physical Therapy Treatment Patient Details Name: Norma Neal MRN: [id number] DOB: [birth year] Today's Date: 08/20/2014    History of Present Illness 74 y.o. female hx of tardive dyskinesia presenting for evaluation of transient episodes of gait instability and slurred speech. Neuro work up underway.    PT Comments    Patient making good gains with mobility and gait.  No dizziness reported today in sitting or standing.  Patient able to ambulate 120' with min guard assist and RW, and negotiated stairs with min assist.  Agree with need for HHPT at discharge.   Follow Up Recommendations  Home health PT;Supervision/Assistance - 24 hour     Equipment Recommendations  None recommended by PT    Recommendations for Other Services       Precautions / Restrictions Precautions Precautions: Fall Restrictions Weight Bearing Restrictions: No    Mobility  Bed Mobility Overal bed mobility: Modified Independent Bed Mobility: Supine to Sit;Sit to Supine     Supine to sit: Modified independent (Device/Increase time) Sit to supine: Modified independent (Device/Increase time)   General bed mobility comments: No physical assist needed.  Patient using rail and required increased time.  Transfers Overall transfer level: Needs assistance Equipment used: Rolling walker (2 wheeled) Transfers: Sit to/from Stand Sit to Stand: Min guard         General transfer comment: Verbal cues for hand placement.  Assist for safety only.  No dizziness with standing.  Ambulation/Gait Ambulation/Gait assistance: Min guard Ambulation Distance (Feet): 120 Feet Assistive device: Rolling walker (2 wheeled) Gait Pattern/deviations: Step-through pattern;Decreased stride length;Trunk flexed Gait velocity: Decreased Gait velocity interpretation: Below normal speed for age/gender General Gait Details: Verbal cues for safe use of RW, especially negotiating obstacles.  Patient with slightly unsteady gait, but no  physical assist needed with use of RW.  No dizziness during gait.   Stairs Stairs: Yes Stairs assistance: Min assist Stair Management: Two rails;Step to pattern;Forwards Number of Stairs: 4 General stair comments: Encouraged patient to use step-to pattern for safety.    Wheelchair Mobility    Modified Rankin (Stroke Patients Only) Modified Rankin (Stroke Patients Only) Pre-Morbid Rankin Score: Moderate disability Modified Rankin: Moderately severe disability     Balance Overall balance assessment: Needs assistance Sitting-balance support: No upper extremity supported;Feet supported Sitting balance-Leahy Scale: Good Sitting balance - Comments: Patient able to maintain sitting balance with no physical assist.   Standing balance support: Bilateral upper extremity supported Standing balance-Leahy Scale: Poor Standing balance comment: Requires support of UE's to maintain balance/safety                    Cognition Arousal/Alertness: Awake/alert Behavior During Therapy: Flat affect Overall Cognitive Status: History of cognitive impairments - at baseline                      Exercises      General Comments        Pertinent Vitals/Pain Pain Assessment: No/denies pain    Home Living                      Prior Function            PT Goals (current goals can now be found in the care plan section) Progress towards PT goals: Progressing toward goals    Frequency  Min 3X/week    PT Plan Current plan remains appropriate    Co-evaluation  End of Session Equipment Utilized During Treatment: Gait belt Activity Tolerance: Patient tolerated treatment well Patient left: in bed;with call bell/phone within reach;with bed alarm set     Time: [id number] PT Time Calculation (min) (ACUTE ONLY): 13 min  Charges:  $Gait Training: 8-22 mins                    G Codes:  Functional Assessment Tool Used: clinical judgement Functional  Limitation: Mobility: Walking and moving around Mobility: Walking and Moving Around Goal Status [phone](G8979): At least 1 percent but less than 20 percent impaired, limited or restricted Mobility: Walking and Moving Around Discharge Status [phone](G8980): At least 1 percent but less than 20 percent impaired, limited or restricted   Vena AustriaDavis, Taliyah Watrous H 08/20/2014, 9:12 AM Durenda HurtSusan H. Renaldo Fiddleravis, PT, [facility] Acute Rehab Services Pager [phone]

## 2014-08-20 NOTE — Progress Notes (Signed)
UR completed 

## 2014-08-20 NOTE — Progress Notes (Addendum)
Subjective:  RN states she is better this AM.  Pt denies dizziness initially, but then reports she feels dizzy when standing.  No other complaints.    Objective: Vital signs in last 24 hours: Filed Vitals:   08/19/14 2145 08/20/14 0204 08/20/14 0459 08/20/14 0905  BP: 175/81 164/78 186/83 170/80  Pulse: 43 46 50 86  Temp: 98.6 F (37 C) 98.4 F (36.9 C) 98.4 F (36.9 C) 98.8 F (37.1 C)  TempSrc: Oral Oral Oral Oral  Resp: Height:      Weight:      SpO2: 100% 99% 99% 100%   Weight change:  No intake or output data in the 24 hours ending 08/20/14 1005 Gen: No acute distress, lying in bed, well developed, well nourished HEENT: Atraumatic, PERRL, EOMI, sclerae anicteric, moist mucous membranes Heart: Regular rate and rhythm, normal S1 S2, no murmurs, rubs, or gallops Lungs: Clear to auscultation bilaterally, respirations unlabored Abd: Soft, non-tender, non-distended, + bowel sounds, no hepatosplenomegaly Ext: No edema or cyanosis Neuro: A&O x 3, CN II-XII intact  Lab Results: Basic Metabolic Panel:  Recent Labs Lab 08/19/14 0550 08/20/14 0337  NA 145 145  K 3.9 3.7  CL 116* 117*  CO2 21 21  GLUCOSE 97 90  BUN 16 14  CREATININE 1.20* 1.19*  CALCIUM 8.5 8.5   Liver Function Tests:  Recent Labs Lab 08/17/14 1714  AST 21  ALT 11  ALKPHOS 69  BILITOT 0.7  PROT 6.6  ALBUMIN 3.8   CBC:  Recent Labs Lab 08/17/14 1714  08/19/14 0550 08/20/14 0337  WBC 5.3  < > 4.5 4.2  NEUTROABS 3.5  --   --   --   HGB 10.9*  < > 9.8* 9.9*  HCT 32.2*  < > 29.1* 29.6*  MCV 89.4  < > 90.4 90.2  PLT 179  < > 148* 162  < > = values in this interval not displayed. Cardiac Enzymes:  Recent Labs Lab 08/17/14 1714 08/18/14 0610 08/18/14 1317 08/19/14 0550  CKTOTAL 197* 154  --  126  TROPONINI  --  <0.03 <0.03  --    CBG:  Recent Labs Lab 08/18/14 1614 08/18/14 2212 08/19/14 0634 08/19/14 1108 08/19/14 1611 08/19/14 2147  GLUCAP 122* 96 88 81  117* 185*   Fasting Lipid Panel:  Recent Labs Lab 08/18/14 0610  CHOL 208*  HDL 75  LDLCALC 113*  TRIG 100  CHOLHDL 2.8   Thyroid Function Tests:  Recent Labs Lab 08/17/14 2226  TSH 1.459   Coagulation:  Recent Labs Lab 08/17/14 1714  LABPROT 11.9  INR 0.87   Urine Drug Screen: Drugs of Abuse     Component Value Date/Time   LABOPIA NONE DETECTED 08/17/2014 1710   COCAINSCRNUR NONE DETECTED 08/17/2014 1710   LABBENZ NONE DETECTED 08/17/2014 1710   AMPHETMU NONE DETECTED 08/17/2014 1710   THCU NONE DETECTED 08/17/2014 1710   LABBARB NONE DETECTED 08/17/2014 1710    Alcohol Level:  Recent Labs Lab 08/17/14 1714  ETH <5   Urinalysis:  Recent Labs Lab 08/17/14 1710  COLORURINE YELLOW  LABSPEC 1.005  PHURINE 6.5  GLUCOSEU NEGATIVE  HGBUR NEGATIVE  BILIRUBINUR NEGATIVE  KETONESUR NEGATIVE  PROTEINUR NEGATIVE  UROBILINOGEN 0.2  NITRITE NEGATIVE  LEUKOCYTESUR SMALL*   Micro Results: No results found for this or any previous visit (from the past 240 hour(s)). Studies/Results: No results found. Prelim carotid doppler: 1-39% ICA stenosis. Vertebral artery flow is antegrade.  Medications: I have reviewed the patient's current medications. Scheduled Meds: .  stroke: mapping our early stages of recovery book   Does not apply Once  . aspirin  325 mg Oral Daily  . escitalopram  20 mg Oral Daily  . ferrous sulfate  325 mg Oral Q breakfast  . heparin  5,000 Units Subcutaneous 3 times per day  . levothyroxine  25 mcg Oral QAC breakfast  . mirtazapine  15 mg Oral QHS  . pantoprazole  40 mg Oral BID AC  . pravastatin  40 mg Oral q1800  . risperiDONE  1 mg Oral BID   Continuous Infusions:  PRN Meds:. Assessment/Plan: Principal Problem:   TIA (transient ischemic attack) Active Problems:   Acute-on-chronic kidney injury   Anemia of chronic disease   Elevated CK   Alzheimer's disease   Hypothyroidism   Atypical chest pain   Ataxia   Other  specified transient cerebral ischemias   CKD (chronic kidney disease) stage 3, GFR 30-59 ml/min  Orthostatic Hypotension Pt remains orthostatic today BP 150/99 (sitting) to 136/80 (standing).  BP is elevated today and was given one dose of amlodipine but would not resume given this may contribute to orthostatic hypotension.  I think lisinopril is a better choice. I spoke with PT and feel like she is more steady on her feet today and would be safe for d/c.  I would like to keep for one more day to see how her BP does on lisinopril only and would not resume a CCB.  Her other psychotropic meds may also be contributing which have been decreased.  Uptodate states specifically that risperdone has been known to cause orthostatic hypotension.    -resume lisinopril 10mg  (was on 20mg  at home)   -ambulate with RN, PT -bed to chair   -TED hose applied  -decrease risperdone to 1.0mg  in AM and 0.5mg  in PM (especially in the setting of CKD)-->would try to taper further but slowly   Transient ischemic attack TIA workup neg.  TTE normal LVEF with minimal DD.   -appreciate neuro recs -cont ASA  -cont to monitor   Controlled DMII HA1c 6.8 08/19/14. -cont cbgs  Recent Labs  08/19/14 1108 08/19/14 1611 08/19/14 2147  GLUCAP 81 117* 185*   Chronic kidney disease Scr stable at 1.19.   -monitor   Anemia Stable.  -cont ferrous sulfate, may benefit from feraheme x 1 as anemia may also contribute to orthostatic hypotension   HTN She is on amlodipine 10 mg daily and lisinopril 20 mg daily at home.  Would not restart a CCB such as amlodipine as this may contribute to orthostatic hypotension.   -would restart lisinopril 10mg  today (SCr is back to baseline)    Hypothyroidism TSH 1.46 on presentation -cont synthroid 25 g daily.  Alzheimer's disease/depression On risperidone 2 mg twice a day with history of tardive dyskinesia that is persistent. Also takes mirtazapine, zolpidem, ambien, and donepezil at  home. Concern that this is making her somnolent in the morning and also could contribute to her orthostatic hypotension. Do not want to make too many changes at once but PCP should reassess. -cont home escitalopram 20 g daily. -decrease risperdal to 1mg  in AM and 0.5mg  PM    -dcrease home mirtazapine 30 mg qhs to mirtazapine 15 mg qhs -hold home ambien. -hold home aricept (can cause syncope)   Dispo:   The patient does have a current PCP Jethro Bastos(Robert N Koehler, MD) and [facility]  after discharge.    Marrian Salvage, MD 08/20/2014, 10:05 AM

## 2014-08-21 DIAGNOSIS — G459 Transient cerebral ischemic attack, unspecified: Secondary | ICD-10-CM | POA: Diagnosis not present

## 2014-08-21 LAB — GLUCOSE, CAPILLARY
GLUCOSE-CAPILLARY: 83 mg/dL (ref 70–99)
Glucose-Capillary: 140 mg/dL — ABNORMAL HIGH (ref 70–99)

## 2014-08-21 MED ORDER — LISINOPRIL 10 MG PO TABS: 20.0000 mg | ORAL_TABLET | Freq: Every day | ORAL | Status: DC

## 2014-08-21 MED ORDER — MIRTAZAPINE 15 MG PO TABS: 15.0000 mg | ORAL_TABLET | Freq: Every day | ORAL | Status: DC

## 2014-08-21 MED ORDER — DOCUSATE SODIUM 100 MG PO CAPS: 100.0000 mg | ORAL_CAPSULE | Freq: Two times a day (BID) | ORAL | Status: DC

## 2014-08-21 MED ORDER — RISPERIDONE 0.5 MG PO TBDP: ORAL_TABLET | ORAL | Status: DC

## 2014-08-21 NOTE — Progress Notes (Signed)
Subjective:  Norma Neal was sitting up eating breakfast in bed. She said she felt well and back at her baseline. We tested orthostatics with her and educated her on changing positions. She said she had minimal lightheadedness during the test. Her sitting BP was 130/75 with HR 86 and standing BP after 3 minutes was 132/66 with HR 77. She said she was eager to go home with family.  Objective: Vital signs in last 24 hours: Filed Vitals:   08/20/14 2149 08/21/14 0219 08/21/14 0545 08/21/14 0911  BP: 142/54 138/61 160/62 136/65  Pulse: 74 48 58 81  Temp: 98.6 F (37 C) 98.3 F (36.8 C) 98 F (36.7 C) 98.3 F (36.8 C)  TempSrc: Oral Oral Oral Oral  Resp: Height:      Weight:      SpO2: 98% 96% 96% 97%   Weight change:  No intake or output data in the 24 hours ending 08/21/14 0941 Gen: No acute distress, sitting up in bed, well developed, well nourished HEENT: Atraumatic, PERRL, EOMI, sclerae anicteric, moist mucous membranes Heart: Regular rate and rhythm, normal S1 S2, no murmurs, rubs, or gallops Lungs: Clear to auscultation bilaterally, respirations unlabored Abd: Soft, non-tender, non-distended, + bowel sounds, no hepatosplenomegaly Ext: No edema or cyanosis Neuro: A&O x 3, CN II-XII intact, strength 5/5 and symmetric in all four extremities, sensation grossly intact  Lab Results: Basic Metabolic Panel:  Recent Labs Lab 08/19/14 0550 08/20/14 0337  NA 145 145  K 3.9 3.7  CL 116* 117*  CO2 21 21  GLUCOSE 97 90  BUN 16 14  CREATININE 1.20* 1.19*  CALCIUM 8.5 8.5   Liver Function Tests:  Recent Labs Lab 08/17/14 1714  AST 21  ALT 11  ALKPHOS 69  BILITOT 0.7  PROT 6.6  ALBUMIN 3.8   CBC:  Recent Labs Lab 08/17/14 1714  08/19/14 0550 08/20/14 0337  WBC 5.3  < > 4.5 4.2  NEUTROABS 3.5  --   --   --   HGB 10.9*  < > 9.8* 9.9*  HCT 32.2*  < > 29.1* 29.6*  MCV 89.4  < > 90.4 90.2  PLT 179  < > 148* 162  < > = values in this interval not  displayed. Cardiac Enzymes:  Recent Labs Lab 08/17/14 1714 08/18/14 0610 08/18/14 1317 08/19/14 0550  CKTOTAL 197* 154  --  126  TROPONINI  --  <0.03 <0.03  --    CBG:  Recent Labs Lab 08/19/14 1611 08/19/14 2147 08/20/14 1125 08/20/14 1605 08/20/14 2152 08/21/14 0706  GLUCAP 117* 185* 103* 107* 103* 140*   Fasting Lipid Panel:  Recent Labs Lab 08/18/14 0610  CHOL 208*  HDL 75  LDLCALC 113*  TRIG 100  CHOLHDL 2.8   Thyroid Function Tests:  Recent Labs Lab 08/17/14 2226  TSH 1.459   Coagulation:  Recent Labs Lab 08/17/14 1714  LABPROT 11.9  INR 0.87   Urine Drug Screen: Drugs of Abuse     Component Value Date/Time   LABOPIA NONE DETECTED 08/17/2014 1710   COCAINSCRNUR NONE DETECTED 08/17/2014 1710   LABBENZ NONE DETECTED 08/17/2014 1710   AMPHETMU NONE DETECTED 08/17/2014 1710   THCU NONE DETECTED 08/17/2014 1710   LABBARB NONE DETECTED 08/17/2014 1710    Alcohol Level:  Recent Labs Lab 08/17/14 1714  ETH <5   Urinalysis:  Recent Labs Lab 08/17/14 1710  COLORURINE YELLOW  LABSPEC 1.005  PHURINE 6.5  GLUCOSEU NEGATIVE  HGBUR NEGATIVE  BILIRUBINUR NEGATIVE  KETONESUR NEGATIVE  PROTEINUR NEGATIVE  UROBILINOGEN 0.2  NITRITE NEGATIVE  LEUKOCYTESUR SMALL*   Micro Results: No results found for this or any previous visit (from the past 240 hour(s)). Studies/Results: No results found. Prelim carotid doppler: 1-39% ICA stenosis. Vertebral artery flow is antegrade.   Medications: I have reviewed the patient's current medications. Scheduled Meds: .  stroke: mapping our early stages of recovery book   Does not apply Once  . aspirin  325 mg Oral Daily  . docusate sodium  100 mg Oral BID  . escitalopram  20 mg Oral Daily  . ferrous sulfate  325 mg Oral Q breakfast  . heparin  5,000 Units Subcutaneous 3 times per day  . levothyroxine  25 mcg Oral QAC breakfast  . lisinopril  10 mg Oral Daily  . mirtazapine  15 mg Oral QHS  .  pantoprazole  40 mg Oral BID AC  . pravastatin  40 mg Oral q1800  . risperiDONE  0.5 mg Oral QHS  . risperiDONE  1 mg Oral Daily   Continuous Infusions:  PRN Meds:. Assessment/Plan: Principal Problem:   TIA (transient ischemic attack) Active Problems:   Acute-on-chronic kidney injury   Anemia of chronic disease   Alzheimer's disease   Atypical chest pain   Ataxia  Orthostatic Hypotension Norma Norma Neal orthostatic hypotension has improved. Yesterday she received TED hose and her lisinopril was started at 10 gm daily (1/2 home dose). PT yesterday felt she was much improved and safe for discharge. This morning we checked her orthostatics and she was 130/75 with HR 86 sitting to 132/62 with HR 77 standing. She had minimal lightheadedness. She was educated on the importance of changing positions slowly and continuing PT/OT at home. Her psychiatric medications may also be contributing adn are being weaned as described below.  -cont lisinopril 10mg  (was on 20mg  at home)   -ambulate with RN, PT -bed to chair   -TED hose applied  -decrease risperdone to 1.0mg  in AM and 0.5mg  in PM (especially in the setting of CKD)-->would try to taper further but slowly   Transient ischemic attack MRI notes no acute process but remote b/l posterior cerebral artery infarcts, L frontoparietal encephalomalacia suggesting remote L MCA infarct, remote b/l basal ganglia and R thalamus lacunar infarcts, and probably small meningioma L frontal convexity but cannot get contrast-enhanced MRI due to renal function. She has risk factors given her previous stroke and type 2 diabetes. Prelim carotid dopplers negative, TTE with EF 55-60%, grade 1 diastolic dysfunction. -appreciate neuro recs -cont ASA  -hold statin given elevated CK on presentation, PCP may reassess -cont to monitor   Controlled DMII HA1c 6.8 08/19/14. -cont cbgs  Recent Labs  08/20/14 1605 08/20/14 2152 08/21/14 0706  GLUCAP 107* 103* 140*   Chronic  kidney disease Scr stable at 1.19, down from 1.44 from presentation -monitor   Anemia Stable. Anemia panel suggestive of iron deficiency with ferritin of 24. Started ferrous sulfate 325 mg po daily -cont ferrous sulfate, may benefit from feraheme x 1 as anemia may also contribute to orthostatic hypotension   HTN She is on amlodipine 10 mg daily and lisinopril 20 mg daily at home.  Would not restart a CCB such as amlodipine as this may contribute to orthostatic hypotension.  BP this morning on half home ACEi is 136/65. -cont lisinopril 10mg  (SCr is back to baseline)    Hypothyroidism TSH 1.46 on presentation -cont synthroid 25 g daily.  Alzheimer's  disease/depression On risperidone 2 mg twice a day with history of tardive dyskinesia that is persistent. Also takes mirtazapine, zolpidem, ambien, and donepezil at home. Concern that this is making her somnolent in the morning and also could contribute to her orthostatic hypotension. Do not want to make too many changes at once but PCP should reassess. -cont home escitalopram 20 g daily. -decrease risperdal to  in AM and 0.5mg  PM    -dcrease home mirtazapine 30 mg qhs to mirtazapine 15 mg qhs -hold home ambien. -hold home aricept (can cause syncope)   Dispo: likely today  The patient does have a current PCP Jethro Bastos, MD) and does need an [facility] hospital follow-up appointment after discharge.    Lorenda Hatchet, MD 08/21/2014, 9:41 AM

## 2014-08-21 NOTE — Discharge Instructions (Signed)
[facility] We have stopped your amlodipine, aricept, ambien, statin. We decreased your mirtazapine from 30 mg at night to 15 mg at night. We have decreased your risperidone from 2 mg twice a day to 1 mg in the morning and 0.5 mg in the evening. Please return to the Emergency Department or seek medical attention if you have any new or worsening weakness, slurred speech, or other worrisome medical condition.   Farley LyAdam Tashonna Descoteaux, MD

## 2014-08-21 NOTE — Progress Notes (Signed)
Pt d/c to home by car with family. Assessment stable. Family verbalizes understanding of d/c instructions. 

## 2014-08-21 NOTE — Care Management Note (Signed)
    Page 1 of 1   08/21/2014     5:20:37 PM CARE MANAGEMENT NOTE 08/21/2014  Patient:  Norma Neal, Norma Neal   Account Number:  [id number]  Date Initiated:  08/21/2014  Documentation initiated by:  [facility]  Subjective/Objective Assessment:   adm:     Action/Plan:   Anticipated DC Date:     Anticipated DC Plan:        [place]  CM consult      Choice offered to / List presented to:             Status of service:  Completed, signed off Medicare Important Message given?   (If response is "NO", the following Medicare IM given date fields will be blank) Date Medicare IM given:   Medicare IM given by:   Date Additional Medicare IM given:   Additional Medicare IM given by:    Discharge Disposition:    Per UR Regulation:    If discussed at Long Length of Stay Meetings, dates discussed:    Comments:  08/21/14 15:00 CM met with pt who refuses any HH services bc she wants to have services rendered by [facility].  Per previous CM note Norma Neal); Needs for PT/OT/SLP in discharge which pt states she will take to [facility].  No other Cm needs were communicated.  Norma Neal, BSN, Cm [phone].

## 2014-08-21 NOTE — Progress Notes (Signed)
Occupational Therapy Treatment Patient Details Name: Norma Neal MRN: [id number] DOB: 12/18/1940 Today's Date: 08/21/2014    History of present illness 74 y.o. female hx of tardive dyskinesia presenting for evaluation of transient episodes of gait instability and slurred speech. Neuro work up underway.   OT comments  Pt progressing. Continue to recommend HHOT upon d/c.  Follow Up Recommendations  Home health OT;Supervision/Assistance - 24 hour    Equipment Recommendations  3 in 1 bedside comode    Recommendations for Other Services      Precautions / Restrictions Precautions Precautions: Fall Restrictions Weight Bearing Restrictions: No       Mobility Bed Mobility Overal bed mobility: Modified Independent             General bed mobility comments: Pt got in bed by going forwards and climbing in. Explained that she could back to bed and then lie down.  Transfers Overall transfer level: Needs assistance Equipment used: Rolling walker (2 wheeled) Transfers: Sit to/from Stand Sit to Stand: Min guard         General transfer comment: cues for technique.        ADL Overall ADL's : Needs assistance/impaired     Grooming: Applying deodorant;Brushing hair;Set up;Supervision/safety;Standing   Upper Body Bathing: Set up;Supervision/ safety;Standing   Lower Body Bathing: Set up;Supervison/ safety (standing-washed peri area/bottom)   Upper Body Dressing : Supervision/safety;Set up;Standing   Lower Body Dressing: Set up;Sitting/lateral leans (socks)   Toilet Transfer: Min guard;Ambulation;RW;BSC           Functional mobility during ADLs: Min guard;Rolling walker General ADL Comments: Educated on safety such as use of bag on walker, safe foot wear, and sitting for most of LB ADLs. Discussed 3 in 1 uses.      Vision                     Perception     Praxis      Cognition  Awake/Alert Behavior During Therapy: Flat affect Overall Cognitive  Status: History of cognitive impairments - at baseline                       Extremity/Trunk Assessment               Exercises     Shoulder Instructions       General Comments      Pertinent Vitals/ Pain       Pain Assessment: No/denies pain  Home Living                                          Prior Functioning/Environment              Frequency Min 2X/week     Progress Toward Goals  OT Goals(current goals can now be found in the care plan section)  Progress towards OT goals: Progressing toward goals-updated goals due to progress  Acute Rehab OT Goals Patient Stated Goal: not stated OT Goal Formulation: Patient unable to participate in goal setting Time For Goal Achievement: 09/01/14 Potential to Achieve Goals: Good ADL Goals Pt Will Perform Grooming: standing;with set-up Pt Will Perform Upper Body Bathing: sitting;with set-up;standing Pt Will Perform Lower Body Bathing: sit to/from stand;with set-up Pt Will Transfer to Toilet: bedside commode;with supervision  Plan Discharge plan needs to be updated    Co-evaluation  End of Session Equipment Utilized During Treatment: Gait belt;Rolling walker   Activity Tolerance Patient tolerated treatment well   Patient Left in bed;with call bell/phone within reach;with bed alarm set   Nurse Communication Mobility status;Other (comment) (alarm set)        Time: 4098-1191 OT Time Calculation (min): 13 min  Charges: OT General Charges $OT Visit: 1 Procedure OT Treatments $Self Care/Home Management : 8-22 mins  Earlie Raveling OTR/L [phone] 08/21/2014, 12:24 PM

## 2015-01-18 ENCOUNTER — Emergency Department (HOSPITAL_COMMUNITY): Payer: Medicare (Managed Care)

## 2015-01-18 ENCOUNTER — Encounter (HOSPITAL_COMMUNITY): Payer: Self-pay | Admitting: Emergency Medicine

## 2015-01-18 ENCOUNTER — Inpatient Hospital Stay (HOSPITAL_COMMUNITY)
Admission: EM | Admit: 2015-01-18 | Discharge: 2015-01-23 | DRG: 389 | Disposition: A | Discharge status: Home / Self Care | Payer: Medicare (Managed Care) | Attending: Internal Medicine | Admitting: Internal Medicine

## 2015-01-18 DIAGNOSIS — E1140 Type 2 diabetes mellitus with diabetic neuropathy, unspecified: Secondary | ICD-10-CM | POA: Diagnosis present

## 2015-01-18 DIAGNOSIS — E1122 Type 2 diabetes mellitus with diabetic chronic kidney disease: Secondary | ICD-10-CM | POA: Diagnosis present

## 2015-01-18 DIAGNOSIS — K449 Diaphragmatic hernia without obstruction or gangrene: Secondary | ICD-10-CM | POA: Diagnosis present

## 2015-01-18 DIAGNOSIS — E039 Hypothyroidism, unspecified: Secondary | ICD-10-CM | POA: Diagnosis present

## 2015-01-18 DIAGNOSIS — R112 Nausea with vomiting, unspecified: Secondary | ICD-10-CM | POA: Diagnosis not present

## 2015-01-18 DIAGNOSIS — K219 Gastro-esophageal reflux disease without esophagitis: Secondary | ICD-10-CM | POA: Diagnosis present

## 2015-01-18 DIAGNOSIS — N63 Unspecified lump in unspecified breast: Secondary | ICD-10-CM

## 2015-01-18 DIAGNOSIS — Z8249 Family history of ischemic heart disease and other diseases of the circulatory system: Secondary | ICD-10-CM

## 2015-01-18 DIAGNOSIS — J449 Chronic obstructive pulmonary disease, unspecified: Secondary | ICD-10-CM | POA: Diagnosis present

## 2015-01-18 DIAGNOSIS — F0280 Dementia in other diseases classified elsewhere without behavioral disturbance: Secondary | ICD-10-CM | POA: Diagnosis present

## 2015-01-18 DIAGNOSIS — N189 Chronic kidney disease, unspecified: Secondary | ICD-10-CM

## 2015-01-18 DIAGNOSIS — I129 Hypertensive chronic kidney disease with stage 1 through stage 4 chronic kidney disease, or unspecified chronic kidney disease: Secondary | ICD-10-CM | POA: Diagnosis present

## 2015-01-18 DIAGNOSIS — I159 Secondary hypertension, unspecified: Secondary | ICD-10-CM

## 2015-01-18 DIAGNOSIS — N183 Chronic kidney disease, stage 3 (moderate): Secondary | ICD-10-CM | POA: Diagnosis present

## 2015-01-18 DIAGNOSIS — K565 Intestinal adhesions [bands] with obstruction (postprocedural) (postinfection): Secondary | ICD-10-CM | POA: Diagnosis not present

## 2015-01-18 DIAGNOSIS — D638 Anemia in other chronic diseases classified elsewhere: Secondary | ICD-10-CM | POA: Diagnosis present

## 2015-01-18 DIAGNOSIS — H409 Unspecified glaucoma: Secondary | ICD-10-CM | POA: Diagnosis present

## 2015-01-18 DIAGNOSIS — N179 Acute kidney failure, unspecified: Secondary | ICD-10-CM | POA: Diagnosis present

## 2015-01-18 DIAGNOSIS — Z7982 Long term (current) use of aspirin: Secondary | ICD-10-CM

## 2015-01-18 DIAGNOSIS — G309 Alzheimer's disease, unspecified: Secondary | ICD-10-CM | POA: Diagnosis present

## 2015-01-18 DIAGNOSIS — Z803 Family history of malignant neoplasm of breast: Secondary | ICD-10-CM

## 2015-01-18 DIAGNOSIS — K56609 Unspecified intestinal obstruction, unspecified as to partial versus complete obstruction: Secondary | ICD-10-CM

## 2015-01-18 LAB — COMPREHENSIVE METABOLIC PANEL
ALT: 12 U/L — ABNORMAL LOW (ref 14–54)
ANION GAP: 9 (ref 5–15)
AST: 18 U/L (ref 15–41)
Albumin: 4.6 g/dL (ref 3.5–5.0)
Alkaline Phosphatase: 61 U/L (ref 38–126)
BUN: 25 mg/dL — ABNORMAL HIGH (ref 6–20)
CALCIUM: 9.7 mg/dL (ref 8.9–10.3)
CHLORIDE: 115 mmol/L — AB (ref 101–111)
CO2: 24 mmol/L (ref 22–32)
Creatinine, Ser: 1.52 mg/dL — ABNORMAL HIGH (ref 0.44–1.00)
GFR calc non Af Amer: 33 mL/min — ABNORMAL LOW (ref >60)
GFR, EST AFRICAN AMERICAN: 38 mL/min — AB (ref >60)
Glucose, Bld: 128 mg/dL — ABNORMAL HIGH (ref 65–99)
Potassium: 4 mmol/L (ref 3.5–5.1)
SODIUM: 148 mmol/L — AB (ref 135–145)
Total Bilirubin: 0.5 mg/dL (ref 0.3–1.2)
Total Protein: 7.7 g/dL (ref 6.5–8.1)

## 2015-01-18 LAB — CBC WITH DIFFERENTIAL/PLATELET
Basophils Absolute: 0 10*3/uL (ref 0.0–0.1)
Basophils Relative: 0 %
EOS ABS: 0 10*3/uL (ref 0.0–0.7)
Eosinophils Relative: 0 %
HCT: 34.7 % — ABNORMAL LOW (ref 36.0–46.0)
Hemoglobin: 11.4 g/dL — ABNORMAL LOW (ref 12.0–15.0)
Lymphocytes Relative: 5 %
Lymphs Abs: 0.5 10*3/uL — ABNORMAL LOW (ref 0.7–4.0)
MCH: 29.1 pg (ref 26.0–34.0)
MCHC: 32.9 g/dL (ref 30.0–36.0)
MCV: 88.5 fL (ref 78.0–100.0)
MONO ABS: 0.1 10*3/uL (ref 0.1–1.0)
MONOS PCT: 1 %
Neutro Abs: 8.9 10*3/uL — ABNORMAL HIGH (ref 1.7–7.7)
Neutrophils Relative %: 94 %
PLATELETS: 187 10*3/uL (ref 150–400)
RBC: 3.92 MIL/uL (ref 3.87–5.11)
RDW: 14.1 % (ref 11.5–15.5)
WBC: 9.5 10*3/uL (ref 4.0–10.5)

## 2015-01-18 LAB — I-STAT CG4 LACTIC ACID, ED: LACTIC ACID, VENOUS: 0.76 mmol/L (ref 0.5–2.0)

## 2015-01-18 LAB — I-STAT TROPONIN, ED: TROPONIN I, POC: 0 ng/mL (ref 0.00–0.08)

## 2015-01-18 LAB — LIPASE, BLOOD: Lipase: 14 U/L — ABNORMAL LOW (ref 22–51)

## 2015-01-18 MED ORDER — IOHEXOL 300 MG/ML  SOLN
100.0000 mL | Freq: Once | INTRAMUSCULAR | Status: AC | PRN
  Administered 2015-01-18: 100 mL via INTRAVENOUS

## 2015-01-18 MED ORDER — MORPHINE SULFATE (PF) 4 MG/ML IV SOLN
4.0000 mg | Freq: Once | INTRAVENOUS | Status: AC
  Administered 2015-01-18: 4 mg via INTRAVENOUS
  Filled 2015-01-18: qty 1

## 2015-01-18 MED ORDER — ONDANSETRON HCL 4 MG/2ML IJ SOLN
4.0000 mg | Freq: Once | INTRAMUSCULAR | Status: AC
  Administered 2015-01-18: 4 mg via INTRAVENOUS
  Filled 2015-01-18: qty 2

## 2015-01-18 MED ORDER — SODIUM CHLORIDE 0.9 % IV BOLUS (SEPSIS)
1000.0000 mL | Freq: Once | INTRAVENOUS | Status: AC
  Administered 2015-01-18: 1000 mL via INTRAVENOUS

## 2015-01-18 NOTE — ED Notes (Signed)
Per [facility] pt from [facility], elderly day care. Pt lives at home. Co mid abd pain 10/10per ems per pt. Also co nausea emesis. Pt received 25 mg IM phenergan and 5 mg Morphine PO at the facility. Pt presents with high BP 199/116 and per ems pt did not have her home meds today. Hx dementia yet pt A and O x 4 per ems. Pt is normally ambulatory . CBG 124 by ems.

## 2015-01-18 NOTE — ED Notes (Signed)
Please call these two numbers if pt gets admitted or DC. Norma Neal [phone] Norma Neal 330 [phone]

## 2015-01-18 NOTE — ED Notes (Signed)
IV team got IV access yet no blood collection.

## 2015-01-18 NOTE — ED Notes (Signed)
Two RNs attempted x 2 IV  access , IV team consult is requested

## 2015-01-18 NOTE — ED Provider Notes (Signed)
CSN: [id number]     Arrival date & time 01/18/15  1834 History   First MD Initiated Contact with Patient 01/18/15 1844     Chief Complaint  Patient presents with  . Abdominal Pain     (Consider location/radiation/quality/duration/timing/severity/associated sxs/prior Treatment) HPI Norma Neal is a 74 y.o. female with history of hypertension, diabetes, Alzheimer's dementia, chronic kidney disease, presents to emergency department from primary care doctor's office with complaint of nausea, vomiting, abdominal pain. Patient is a pace patient. She reports onset of nausea, vomiting, abdominal pain this morning. Pain is mainly epigastric. States she has had some loose bowel movements as well. 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 While at primary care doctor's office, patient was actively vomiting, gastric occult was positive. Patient was also hypertensive, she admits she did not eat today or taken any of her blood pressure medications. Patient was given 25 mg of Phenergan IM and 5 mg sublingual morphine tablets, she had no improvement in her symptoms so she was sent here for further evaluation. Patient does have history of transabdominal hysterectomy, denies any prior abdominal surgeries.   Past Medical History  Diagnosis Date  . Hypertension   . Diabetes mellitus   . Dementia   . Alzheimer's disease   . Anemia of other chronic disease   . Chronic obstructive asthma, unspecified   . Esophageal reflux   . Unspecified hypothyroidism   . Nontoxic multinodular goiter   . 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 Unspecified vitamin D deficiency   . Psychotic depression   . Insomnia   . Drug induced akathisia   . Diabetic neuropathy   . Chronic kidney disease (CKD), stage III (moderate)   .  Lichen planus    Past Surgical History  Procedure Laterality Date  . Abdominal hysterectomy  yrs ago    ovaries also  . Knee surgery Left yrs ago  . Partial thryoidectomy  yrs ago  . Esophagogastroduodenoscopy (egd) with propofol N/A 03/31/2014    Procedure: ESOPHAGOGASTRODUODENOSCOPY (EGD) WITH PROPOFOL;  Surgeon: Rachael Fee, MD;  Location: [facility];  Service: Endoscopy;  Laterality: N/A;   Family History  Problem Relation Age of Onset  . Cancer Brother     lung  . Cancer Sister     ?  Marland Kitchen Breast cancer Sister     x2  . Heart attack Sister    Social History  Substance Use Topics  . Smoking status: Never Smoker   . Smokeless tobacco: Never Used  . Alcohol Use: No   OB History    No data available     Review of Systems  Constitutional: Negative for fever and chills.  Respiratory: Negative for cough, chest tightness and shortness of breath.   Cardiovascular: Negative for chest pain, palpitations and leg swelling.  Gastrointestinal: Positive for nausea, vomiting, abdominal pain and diarrhea.  Genitourinary: Negative for dysuria, flank pain and pelvic pain.  Musculoskeletal: Negative for myalgias, arthralgias, neck pain and neck stiffness.  Skin: Negative for rash.  Neurological: Negative for dizziness, weakness and headaches.  All other systems reviewed and are negative.     Allergies  Review of patient's allergies indicates no known allergies.  Home Medications   Prior to Admission medications   Medication Sig Start Date End Date Taking? Authorizing Provider  aspirin 325 MG tablet  Take 1 tablet (325 mg total) by mouth daily. 08/19/14   Lorenda Hatchet, MD  docusate sodium (COLACE) 100 MG capsule Take 1 capsule (100 mg total) by mouth 2 (two) times daily. 08/21/14   Lorenda Hatchet, MD  escitalopram (LEXAPRO) 20 MG tablet Take 20 mg by mouth daily.    Historical Provider, MD  ferrous sulfate 325 (65 FE) MG tablet Take 1 tablet (325 mg total) by mouth daily with  breakfast. 08/20/14   Lorenda Hatchet, MD  levothyroxine (SYNTHROID, LEVOTHROID) 25 MCG tablet Take 25 mcg by mouth daily.    Historical Provider, MD  lisinopril (PRINIVIL,ZESTRIL) 10 MG tablet Take 2 tablets (20 mg total) by mouth daily. 08/21/14   Lorenda Hatchet, MD  Loperamide HCl (ANTI-DIARRHEAL PO) Take 2 tablets by mouth daily as needed. For diarrheal symptoms.    Historical Provider, MD  mirtazapine (REMERON) 15 MG tablet Take 1 tablet (15 mg total) by mouth at bedtime. 08/21/14   Lorenda Hatchet, MD  pantoprazole (PROTONIX) 40 MG tablet Take 1 tablet (40 mg total) by mouth 2 (two) times daily before a meal. 03/02/14   Rachael Fee, MD  risperiDONE (RISPERDAL M-TABS) 0.5 MG disintegrating tablet Take 1 mg in the morning, 0.5 mg in the evening 08/21/14   Lorenda Hatchet, MD   BP 181/110 mmHg  Pulse 103  Temp(Src) 98 F (36.7 C) (Oral)  Resp 18  SpO2 99% Physical Exam  Constitutional: She is oriented to person, place, and time. She appears well-developed and well-nourished. No distress.  HENT:  Head: Normocephalic.  Eyes: Conjunctivae are normal.  Neck: Neck supple.  Cardiovascular: Normal rate, regular rhythm and normal heart sounds.   Pulmonary/Chest: Effort normal and breath sounds normal. No respiratory distress. She has no wheezes. She has no rales.  Abdominal: Soft. Bowel sounds are normal. She exhibits no distension. There is tenderness. There is no rebound.  Diffuse tenderness, worse in epigastric area, upper abdomen bilaterally  Musculoskeletal: She exhibits no edema.  Neurological: She is alert and oriented to person, place, and time.  Skin: Skin is warm and dry.  Psychiatric: She has a normal mood and affect. Her behavior is normal.  Nursing note and vitals reviewed.   ED Course  Procedures (including critical care time) Labs Review Labs Reviewed  CBC WITH DIFFERENTIAL/PLATELET - Abnormal; Notable for the following:    Hemoglobin 11.4 (*)    HCT 34.7 (*)    Neutro Abs  8.9 (*)    Lymphs Abs 0.5 (*)    All other components within normal limits  COMPREHENSIVE METABOLIC PANEL - Abnormal; Notable for the following:    Sodium 148 (*)    Chloride 115 (*)    Glucose, Bld 128 (*)    BUN 25 (*)    Creatinine, Ser 1.52 (*)    ALT 12 (*)    GFR calc non Af Amer 33 (*)    GFR calc Af Amer 38 (*)    All other components within normal limits  LIPASE, BLOOD - Abnormal; Notable for the following:    Lipase 14 (*)    All other components within normal limits  URINALYSIS, ROUTINE W REFLEX MICROSCOPIC (NOT AT [facility]) - Abnormal; Notable for the following:    APPearance CLOUDY (*)    Specific Gravity, Urine >1.046 (*)    Leukocytes, UA SMALL (*)    All other components within normal limits  URINE MICROSCOPIC-ADD ON - Abnormal; Notable for the following:  Squamous Epithelial / LPF FEW (*)    All other components within normal limits  I-STAT CG4 LACTIC ACID, ED  I-STAT TROPOININ, ED  I-STAT CG4 LACTIC ACID, ED    Imaging Review Ct Abdomen Pelvis W Contrast  01/18/2015   CLINICAL DATA:  pt from [facility], elderly day care. Pt lives at home. Co mid abd pain 10/10 per ems per pt. Also co nausea emesis. Pt received 25 mg IM phenergan and 5 mg Morphine PO at the facility.  EXAM: CT ABDOMEN AND PELVIS WITH CONTRAST  TECHNIQUE: Multidetector CT imaging of the abdomen and pelvis was performed using the standard protocol following bolus administration of intravenous contrast.  CONTRAST:  OMNIPAQUE IOHEXOL 300 MG/ML  SOLN  COMPARISON:  None.  FINDINGS: Lower chest: There is mild scarring at the lung bases. Note is made of a 1.9 cm density within the central aspect of the right breast, only partially imaged on this exam. Heart size is normal. No imaged pericardial effusion or significant coronary artery calcifications.  Upper abdomen: No focal abnormality identified within the liver, spleen, pancreas, or adrenal glands. Cyst is identified in the lower pole of the left  kidney. No hydronephrosis. Gallbladder is present.  Gastrointestinal tract: Hiatal hernia is present. The stomach is otherwise normal in appearance. This proximal small bowel loops are normal in caliber. There is significant dilatation of the mid small bowel loops. The proximal and distal small bowel loops appear normal caliber. There is a transition zone of dilated to normal caliber small bowel loops in the left mid abdomen. Suspect adhesions as a cause for obstruction. No mass is identified in this region. Numerous colonic diverticula. No associated inflammation.  Pelvis: Status post hysterectomy. Small amount of free pelvic fluid.  Retroperitoneum: Atherosclerotic calcification of the abdominal aorta. No aneurysm.  Abdominal wall: Postoperative changes in the anterior abdominal wall.  Osseous structures: Facet hypertrophy in the lower lumbar levels.  IMPRESSION: 1. High-grade small bowel obstruction best localized to the left mid abdomen. Adhesions are suspected as a cause for obstruction. Small amount of free pelvic fluid. No free air. 2. Colonic diverticulosis without acute diverticulitis. 3. Hiatal hernia. 4. Question of right breast mass measuring 1.9 cm. Further evaluation with diagnostic mammogram and ultrasound at a dedicated facility is recommended when the patient is able. 5. Left lower pole renal cyst. 6. Status post hysterectomy. 7. Abdominal aortic atherosclerosis. 8. Degenerative disc disease   Electronically Signed   By: Norva Pavlov M.D.   On: 01/18/2015 23:13   Dg Abd Acute W/chest  01/18/2015   CLINICAL DATA:  Nausea, vomiting and lower abdominal pain today, history hypertension, diabetes mellitus, stage III chronic kidney disease, Alzheimer's, GERD  EXAM: DG ABDOMEN ACUTE W/ 1V CHEST  COMPARISON:  Abdominal radiograph 10/18/2013  FINDINGS: Normal heart size, mediastinal contours, and pulmonary vascularity.  Atherosclerotic calcification aorta.  Lungs clear.  No pleural effusion or  pneumothorax.  Bones demineralized.  Air-filled loops of small bowel which are dilated and out of proportion to relatively decreased amount of colonic gas compatible with small bowel obstruction.  No free intraperitoneal air.  Mild degenerate disc and facet disease changes lumbar spine.  No urinary tract calcification.  IMPRESSION: Small bowel obstruction.   Electronically Signed   By: Ulyses Southward M.D.   On: 01/18/2015 19:38   I have personally reviewed and evaluated these images and lab results as part of my medical decision-making.   EKG Interpretation None  MDM   Final diagnoses:  Small bowel obstruction  Secondary hypertension, unspecified  CKD (chronic kidney disease), unspecified stage    Patient with abdominal pain, nausea, vomiting onset this morning. Will start with lab work, acute abdominal series, Zofran and morphine ordered IV. Full reassess. Patient is tachycardic, will give bolus of IV fluids.  12:10 AM Xray showed SBO. Discussed with Dr. Agapito Games, CT obtained which confirmed diagnosis. Pt feeling much better after a dose of morphine and zofran. No longer vomiting. NG tube placement pending. PT continues to be hypertensive, did not take her meds this AM. Will hold. She is asymptomatic for HTN.  Spoke with Dr. Maisie Fus, gen surgery, will consult.   Spoke with. Dr. Julian Reil, hospitalist, will admit.   Filed Vitals:   01/18/15 1847 01/18/15 2238  BP: 181/110 190/100  Pulse: 103 106  Temp: 98 F (36.7 C) 98 F (36.7 C)  TempSrc: Oral Oral  Resp: 18 20  SpO2: 99% 96%      Jaynie Crumble, PA-C 01/19/15 0012  Bethann Berkshire, MD 01/20/15 [phone]

## 2015-01-18 NOTE — ED Notes (Signed)
Surgeon at bedside.  

## 2015-01-18 NOTE — ED Notes (Signed)
Pt currently in radiology, will complete lab work when pt returns.

## 2015-01-18 NOTE — Consult Note (Signed)
Reason for Consult:SBO Referring Physician: Dr Rubbie Battiest is an 74 y.o. female.  HPI:  Pt with Alzheimer's dementia and chronic kidney disease, presents to emergency department from primary care doctor's office with complaint of nausea, vomiting, abdominal pain.  She reports onset of nausea, vomiting, abdominal pain this morning. Pain is mainly epigastric. States she has had some loose bowel movements as well. She states yesterday she felt "bloated." Patient has extensive history of nonspecific abdominal complaints according to the primary care physician's note.  Patient does have a remote history of abdominal hysterectomy but denies any other abdominal surgeries.  Past Medical History  Diagnosis Date  . Hypertension   . Diabetes mellitus   . Dementia   . Alzheimer's disease   . Anemia of other chronic disease   . Chronic obstructive asthma, unspecified   . Esophageal reflux   . Unspecified hypothyroidism   . Nontoxic multinodular goiter   . 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 Unspecified vitamin D deficiency   . Psychotic depression   . Insomnia   . Drug induced akathisia   . Diabetic neuropathy   . Chronic kidney disease (CKD), stage III (moderate)   . Lichen planus     Past Surgical History  Procedure Laterality Date  . Abdominal hysterectomy  yrs ago    ovaries also  . Knee surgery Left yrs ago  . Partial thryoidectomy  yrs ago  . Esophagogastroduodenoscopy (egd) with propofol N/A 03/31/2014    Procedure: ESOPHAGOGASTRODUODENOSCOPY (EGD) WITH PROPOFOL;  Surgeon: Milus Banister, MD;  Location: [facility];  Service: Endoscopy;  Laterality: N/A;    Family History  Problem Relation Age of Onset  . Cancer Brother     lung  . Cancer Sister     ?  Marland Kitchen Breast cancer Sister     x2  . Heart attack Sister     Social History:  reports that she has never smoked. She has never used smokeless tobacco. She reports that she does not drink alcohol or use illicit  drugs.  Allergies: No Known Allergies  Medications: I have reviewed the patient's current medications.  Results for orders placed or performed during the hospital encounter of 01/18/15 (from the past 48 hour(s))  CBC with Differential     Status: Abnormal   Collection Time: 01/18/15  9:06 PM  Result Value Ref Range   WBC 9.5 4.0 - 10.5 K/uL   RBC 3.92 3.87 - 5.11 MIL/uL   Hemoglobin 11.4 (L) 12.0 - 15.0 g/dL   HCT 34.7 (L) 36.0 - 46.0 %   MCV 88.5 78.0 - 100.0 fL   MCH 29.1 26.0 - 34.0 pg   MCHC 32.9 30.0 - 36.0 g/dL   RDW 14.1 11.5 - 15.5 %   Platelets 187 150 - 400 K/uL   Neutrophils Relative % 94 %   Neutro Abs 8.9 (H) 1.7 - 7.7 K/uL   Lymphocytes Relative 5 %   Lymphs Abs 0.5 (L) 0.7 - 4.0 K/uL   Monocytes Relative 1 %   Monocytes Absolute 0.1 0.1 - 1.0 K/uL   Eosinophils Relative 0 %   Eosinophils Absolute 0.0 0.0 - 0.7 K/uL   Basophils Relative 0 %   Basophils Absolute 0.0 0.0 - 0.1 K/uL  Comprehensive metabolic panel     Status: Abnormal   Collection Time: 01/18/15  9:06 PM  Result Value Ref Range   Sodium 148 (H) 135 - 145 mmol/L   Potassium 4.0 3.5 - 5.1 mmol/L  Chloride 115 (H) 101 - 111 mmol/L   CO2 24 22 - 32 mmol/L   Glucose, Bld 128 (H) 65 - 99 mg/dL   BUN 25 (H) 6 - 20 mg/dL   Creatinine, Ser 1.52 (H) 0.44 - 1.00 mg/dL   Calcium 9.7 8.9 - 10.3 mg/dL   Total Protein 7.7 6.5 - 8.1 g/dL   Albumin 4.6 3.5 - 5.0 g/dL   AST 18 15 - 41 U/L   ALT 12 (L) 14 - 54 U/L   Alkaline Phosphatase 61 38 - 126 U/L   Total Bilirubin 0.5 0.3 - 1.2 mg/dL   GFR calc non Af Amer 33 (L) >60 mL/min   GFR calc Af Amer 38 (L) >60 mL/min    Comment: (NOTE) The eGFR has been calculated using the CKD EPI equation. This calculation has not been validated in all clinical situations. eGFR's persistently <60 mL/min signify possible Chronic Kidney Disease.    Anion gap 9 5 - 15  Lipase, blood     Status: Abnormal   Collection Time: 01/18/15  9:06 PM  Result Value Ref Range    Lipase 14 (L) 22 - 51 U/L  I-Stat Troponin, ED (not at [facility])     Status: None   Collection Time: 01/18/15  9:14 PM  Result Value Ref Range   Troponin i, poc 0.00 0.00 - 0.08 ng/mL   Comment 3            Comment: Due to the release kinetics of cTnI, a negative result within the first hours of the onset of symptoms does not rule out myocardial infarction with certainty. If myocardial infarction is still suspected, repeat the test at appropriate intervals.   I-Stat CG4 Lactic Acid, ED     Status: None   Collection Time: 01/18/15  9:16 PM  Result Value Ref Range   Lactic Acid, Venous 0.76 0.5 - 2.0 mmol/L    Ct Abdomen Pelvis W Contrast  01/18/2015   CLINICAL DATA:  pt from [facility], [facility] day [facility]. Pt lives at home. Co mid abd pain 10/10 per ems per pt. Also co nausea emesis. Pt received 25 mg IM phenergan and 5 mg Morphine PO at the facility.  EXAM: CT ABDOMEN AND PELVIS WITH CONTRAST  TECHNIQUE: Multidetector CT imaging of the abdomen and pelvis was performed using the standard protocol following bolus administration of intravenous contrast.  CONTRAST:  140m OMNIPAQUE IOHEXOL 300 MG/ML  SOLN  COMPARISON:  None.  FINDINGS: Lower chest: There is mild scarring at the lung bases. Note is made of a 1.9 cm density within the central aspect of the right breast, only partially imaged on this exam. Heart size is normal. No imaged pericardial effusion or significant coronary artery calcifications.  Upper abdomen: No focal abnormality identified within the liver, spleen, pancreas, or adrenal glands. Cyst is identified in the lower pole of the left kidney. No hydronephrosis. Gallbladder is present.  Gastrointestinal tract: Hiatal hernia is present. The stomach is otherwise normal in appearance. This proximal small bowel loops are normal in caliber. There is significant dilatation of the mid small bowel loops. The proximal and distal small bowel loops appear normal caliber. There is a transition  zone of dilated to normal caliber small bowel loops in the left mid abdomen. Suspect adhesions as a cause for obstruction. No mass is identified in this region. Numerous colonic diverticula. No associated inflammation.  Pelvis: Status post hysterectomy. Small amount of free pelvic fluid.  Retroperitoneum: Atherosclerotic  calcification of the abdominal aorta. No aneurysm.  Abdominal wall: Postoperative changes in the anterior abdominal wall.  Osseous structures: Facet hypertrophy in the lower lumbar levels.  IMPRESSION: 1. High-grade small bowel obstruction best localized to the left mid abdomen. Adhesions are suspected as a cause for obstruction. Small amount of free pelvic fluid. No free air. 2. Colonic diverticulosis without acute diverticulitis. 3. Hiatal hernia. 4. Question of right breast mass measuring 1.9 cm. Further evaluation with diagnostic mammogram and ultrasound at a dedicated facility is recommended when the patient is able. 5. Left lower pole renal cyst. 6. Status post hysterectomy. 7. Abdominal aortic atherosclerosis. 8. Degenerative disc disease   Electronically Signed   By: Nolon Nations M.D.   On: 01/18/2015 23:13   Dg Abd Acute W/chest  01/18/2015   CLINICAL DATA:  Nausea, vomiting and lower abdominal pain today, history hypertension, diabetes mellitus, stage III chronic kidney disease, Alzheimer's, GERD  EXAM: DG ABDOMEN ACUTE W/ 1V CHEST  COMPARISON:  Abdominal radiograph 10/18/2013  FINDINGS: Normal heart size, mediastinal contours, and pulmonary vascularity.  Atherosclerotic calcification aorta.  Lungs clear.  No pleural effusion or pneumothorax.  Bones demineralized.  Air-filled loops of small bowel which are dilated and out of proportion to relatively decreased amount of colonic gas compatible with small bowel obstruction.  No free intraperitoneal air.  Mild degenerate disc and facet disease changes lumbar spine.  No urinary tract calcification.  IMPRESSION: Small bowel obstruction.    Electronically Signed   By: Lavonia Dana M.D.   On: 01/18/2015 19:38    Review of Systems  Constitutional: Negative for fever, chills and weight loss.  Eyes: Negative for blurred vision.  Respiratory: Negative for cough and shortness of breath.   Cardiovascular: Negative for chest pain and palpitations.  Gastrointestinal: Positive for nausea, vomiting, abdominal pain and diarrhea.  Genitourinary: Negative for dysuria, urgency and frequency.  Skin: Negative for rash.  Neurological: Negative for dizziness and headaches.   Blood pressure 190/100, pulse 106, temperature 98 F (36.7 C), temperature source Oral, resp. rate 20, SpO2 96 %. Physical Exam  Constitutional: She is oriented to person, place, and time. She appears well-nourished. No distress.  HENT:  Head: Normocephalic and atraumatic.  Eyes: Conjunctivae and EOM are normal. Pupils are equal, round, and reactive to light. No scleral icterus.  Neck: Normal range of motion. Neck supple.  Cardiovascular: Normal rate and regular rhythm.   Respiratory: Effort normal. No respiratory distress.  GI: Soft. She exhibits no distension. There is no tenderness. There is no rebound and no guarding.  Musculoskeletal: Normal range of motion.  Neurological: She is alert and oriented to person, place, and time.  Skin: Skin is warm and dry. She is not diaphoretic.    Assessment/Plan: Pt with SBO.  Insert NG.  Pain and nausea control.  BP management per primary team.  IVF's for hydration.  Hold aspirin.  No acute surgical needs tonight.  Will follow patient with you.  THOMAS, ALICIA C. 2/91/9166, 06:00 PM

## 2015-01-19 DIAGNOSIS — H409 Unspecified glaucoma: Secondary | ICD-10-CM | POA: Diagnosis present

## 2015-01-19 DIAGNOSIS — N63 Unspecified lump in breast: Secondary | ICD-10-CM | POA: Diagnosis present

## 2015-01-19 DIAGNOSIS — N189 Chronic kidney disease, unspecified: Secondary | ICD-10-CM | POA: Diagnosis not present

## 2015-01-19 DIAGNOSIS — J449 Chronic obstructive pulmonary disease, unspecified: Secondary | ICD-10-CM | POA: Diagnosis present

## 2015-01-19 DIAGNOSIS — K449 Diaphragmatic hernia without obstruction or gangrene: Secondary | ICD-10-CM | POA: Diagnosis present

## 2015-01-19 DIAGNOSIS — I159 Secondary hypertension, unspecified: Secondary | ICD-10-CM | POA: Diagnosis not present

## 2015-01-19 DIAGNOSIS — K219 Gastro-esophageal reflux disease without esophagitis: Secondary | ICD-10-CM | POA: Diagnosis present

## 2015-01-19 DIAGNOSIS — K565 Intestinal adhesions [bands] with obstruction (postprocedural) (postinfection): Secondary | ICD-10-CM | POA: Diagnosis present

## 2015-01-19 DIAGNOSIS — N179 Acute kidney failure, unspecified: Secondary | ICD-10-CM | POA: Diagnosis present

## 2015-01-19 DIAGNOSIS — Z7982 Long term (current) use of aspirin: Secondary | ICD-10-CM | POA: Diagnosis not present

## 2015-01-19 DIAGNOSIS — K5669 Other intestinal obstruction: Secondary | ICD-10-CM | POA: Diagnosis not present

## 2015-01-19 DIAGNOSIS — G309 Alzheimer's disease, unspecified: Secondary | ICD-10-CM | POA: Diagnosis present

## 2015-01-19 DIAGNOSIS — E1122 Type 2 diabetes mellitus with diabetic chronic kidney disease: Secondary | ICD-10-CM | POA: Diagnosis present

## 2015-01-19 DIAGNOSIS — Z803 Family history of malignant neoplasm of breast: Secondary | ICD-10-CM | POA: Diagnosis not present

## 2015-01-19 DIAGNOSIS — R112 Nausea with vomiting, unspecified: Secondary | ICD-10-CM | POA: Diagnosis present

## 2015-01-19 DIAGNOSIS — I129 Hypertensive chronic kidney disease with stage 1 through stage 4 chronic kidney disease, or unspecified chronic kidney disease: Secondary | ICD-10-CM | POA: Diagnosis present

## 2015-01-19 DIAGNOSIS — E1140 Type 2 diabetes mellitus with diabetic neuropathy, unspecified: Secondary | ICD-10-CM | POA: Diagnosis present

## 2015-01-19 DIAGNOSIS — D638 Anemia in other chronic diseases classified elsewhere: Secondary | ICD-10-CM | POA: Diagnosis present

## 2015-01-19 DIAGNOSIS — Z8249 Family history of ischemic heart disease and other diseases of the circulatory system: Secondary | ICD-10-CM | POA: Diagnosis not present

## 2015-01-19 DIAGNOSIS — N183 Chronic kidney disease, stage 3 (moderate): Secondary | ICD-10-CM | POA: Diagnosis present

## 2015-01-19 DIAGNOSIS — R928 Other abnormal and inconclusive findings on diagnostic imaging of breast: Secondary | ICD-10-CM

## 2015-01-19 DIAGNOSIS — E039 Hypothyroidism, unspecified: Secondary | ICD-10-CM | POA: Diagnosis present

## 2015-01-19 DIAGNOSIS — F0280 Dementia in other diseases classified elsewhere without behavioral disturbance: Secondary | ICD-10-CM | POA: Diagnosis present

## 2015-01-19 LAB — BASIC METABOLIC PANEL
ANION GAP: 7 (ref 5–15)
BUN: 20 mg/dL (ref 6–20)
CALCIUM: 8.8 mg/dL — AB (ref 8.9–10.3)
CHLORIDE: 118 mmol/L — AB (ref 101–111)
CO2: 21 mmol/L — AB (ref 22–32)
Creatinine, Ser: 1.24 mg/dL — ABNORMAL HIGH (ref 0.44–1.00)
GFR calc non Af Amer: 42 mL/min — ABNORMAL LOW (ref >60)
GFR, EST AFRICAN AMERICAN: 48 mL/min — AB (ref >60)
GLUCOSE: 95 mg/dL (ref 65–99)
POTASSIUM: 3.7 mmol/L (ref 3.5–5.1)
Sodium: 146 mmol/L — ABNORMAL HIGH (ref 135–145)

## 2015-01-19 LAB — URINALYSIS, ROUTINE W REFLEX MICROSCOPIC
Bilirubin Urine: NEGATIVE
Glucose, UA: NEGATIVE mg/dL
HGB URINE DIPSTICK: NEGATIVE
Ketones, ur: NEGATIVE mg/dL
NITRITE: NEGATIVE
Protein, ur: NEGATIVE mg/dL
UROBILINOGEN UA: 0.2 mg/dL (ref 0.0–1.0)
pH: 6 (ref 5.0–8.0)

## 2015-01-19 LAB — MAGNESIUM: Magnesium: 2.1 mg/dL (ref 1.7–2.4)

## 2015-01-19 LAB — URINE MICROSCOPIC-ADD ON

## 2015-01-19 LAB — CBC
HEMATOCRIT: 29.3 % — AB (ref 36.0–46.0)
HEMOGLOBIN: 9.7 g/dL — AB (ref 12.0–15.0)
MCH: 29.6 pg (ref 26.0–34.0)
MCHC: 33.1 g/dL (ref 30.0–36.0)
MCV: 89.3 fL (ref 78.0–100.0)
Platelets: 168 10*3/uL (ref 150–400)
RBC: 3.28 MIL/uL — AB (ref 3.87–5.11)
RDW: 14.2 % (ref 11.5–15.5)
WBC: 6.2 10*3/uL (ref 4.0–10.5)

## 2015-01-19 MED ORDER — ENALAPRILAT 1.25 MG/ML IV SOLN
0.6250 mg | Freq: Four times a day (QID) | INTRAVENOUS | Status: DC
  Administered 2015-01-19 (×2): 0.625 mg via INTRAVENOUS
  Filled 2015-01-19 (×4): qty 0.5

## 2015-01-19 MED ORDER — ASPIRIN 325 MG PO TABS
325.0000 mg | ORAL_TABLET | Freq: Every day | ORAL | Status: DC
  Filled 2015-01-19: qty 1

## 2015-01-19 MED ORDER — ESCITALOPRAM OXALATE 20 MG PO TABS
20.0000 mg | ORAL_TABLET | Freq: Every day | ORAL | Status: DC
  Administered 2015-01-19 – 2015-01-23 (×5): 20 mg via ORAL
  Filled 2015-01-19 (×5): qty 1

## 2015-01-19 MED ORDER — ENALAPRILAT 1.25 MG/ML IV SOLN
1.2500 mg | Freq: Four times a day (QID) | INTRAVENOUS | Status: DC
  Administered 2015-01-19 – 2015-01-23 (×15): 1.25 mg via INTRAVENOUS
  Filled 2015-01-19 (×18): qty 1

## 2015-01-19 MED ORDER — ONDANSETRON HCL 4 MG/2ML IJ SOLN
4.0000 mg | Freq: Four times a day (QID) | INTRAMUSCULAR | Status: DC | PRN
  Administered 2015-01-22: 4 mg via INTRAVENOUS
  Filled 2015-01-19: qty 2

## 2015-01-19 MED ORDER — RISPERIDONE 1 MG PO TBDP
1.0000 mg | ORAL_TABLET | Freq: Every day | ORAL | Status: DC
  Administered 2015-01-20 – 2015-01-23 (×4): 1 mg via ORAL
  Filled 2015-01-19 (×4): qty 1

## 2015-01-19 MED ORDER — SODIUM CHLORIDE 0.9 % IV SOLN
INTRAVENOUS | Status: DC
  Administered 2015-01-19 – 2015-01-20 (×3): via INTRAVENOUS

## 2015-01-19 MED ORDER — MORPHINE SULFATE (PF) 2 MG/ML IV SOLN
2.0000 mg | INTRAVENOUS | Status: DC | PRN
  Administered 2015-01-19 – 2015-01-21 (×3): 4 mg via INTRAVENOUS
  Administered 2015-01-23 (×2): 2 mg via INTRAVENOUS
  Filled 2015-01-19: qty 1
  Filled 2015-01-19: qty 2
  Filled 2015-01-19: qty 1
  Filled 2015-01-19 (×2): qty 2

## 2015-01-19 MED ORDER — LEVOTHYROXINE SODIUM 100 MCG IV SOLR
12.5000 ug | Freq: Every day | INTRAVENOUS | Status: DC
  Administered 2015-01-19 – 2015-01-23 (×5): 12.5 ug via INTRAVENOUS
  Filled 2015-01-19 (×5): qty 5

## 2015-01-19 MED ORDER — PANTOPRAZOLE SODIUM 40 MG PO TBEC: 40.0000 mg | DELAYED_RELEASE_TABLET | Freq: Two times a day (BID) | ORAL | Status: DC

## 2015-01-19 MED ORDER — MIRTAZAPINE 15 MG PO TABS
15.0000 mg | ORAL_TABLET | Freq: Every day | ORAL | Status: DC
  Administered 2015-01-19 – 2015-01-22 (×5): 15 mg via ORAL
  Filled 2015-01-19 (×6): qty 1

## 2015-01-19 MED ORDER — CHLORHEXIDINE GLUCONATE 0.12 % MT SOLN
15.0000 mL | Freq: Two times a day (BID) | OROMUCOSAL | Status: DC
  Administered 2015-01-19 – 2015-01-23 (×9): 15 mL via OROMUCOSAL
  Filled 2015-01-19 (×10): qty 15

## 2015-01-19 MED ORDER — HYDRALAZINE HCL 20 MG/ML IJ SOLN
10.0000 mg | INTRAMUSCULAR | Status: DC | PRN
  Administered 2015-01-19 – 2015-01-22 (×7): 10 mg via INTRAVENOUS
  Filled 2015-01-19 (×7): qty 1

## 2015-01-19 MED ORDER — LISINOPRIL 20 MG PO TABS
20.0000 mg | ORAL_TABLET | Freq: Every day | ORAL | Status: DC
  Filled 2015-01-19: qty 1

## 2015-01-19 MED ORDER — RISPERIDONE 0.5 MG PO TBDP
0.5000 mg | ORAL_TABLET | Freq: Two times a day (BID) | ORAL | Status: DC
Start: 2015-01-19 — End: 2015-01-19
  Administered 2015-01-19: 0.5 mg via ORAL
  Filled 2015-01-19 (×3): qty 2

## 2015-01-19 MED ORDER — PANTOPRAZOLE SODIUM 40 MG IV SOLR
40.0000 mg | Freq: Two times a day (BID) | INTRAVENOUS | Status: DC
  Administered 2015-01-19 – 2015-01-23 (×9): 40 mg via INTRAVENOUS
  Filled 2015-01-19 (×11): qty 40

## 2015-01-19 MED ORDER — RISPERIDONE 0.5 MG PO TBDP
0.5000 mg | ORAL_TABLET | Freq: Every day | ORAL | Status: DC
  Administered 2015-01-19 – 2015-01-22 (×4): 0.5 mg via ORAL
  Filled 2015-01-19 (×5): qty 1

## 2015-01-19 MED ORDER — METOPROLOL TARTRATE 1 MG/ML IV SOLN
5.0000 mg | Freq: Once | INTRAVENOUS | Status: AC
  Administered 2015-01-19: 5 mg via INTRAVENOUS
  Filled 2015-01-19: qty 5

## 2015-01-19 MED ORDER — ALBUTEROL SULFATE HFA 108 (90 BASE) MCG/ACT IN AERS: 1.0000 | INHALATION_SPRAY | Freq: Four times a day (QID) | RESPIRATORY_TRACT | Status: DC | PRN

## 2015-01-19 MED ORDER — CETYLPYRIDINIUM CHLORIDE 0.05 % MT LIQD
7.0000 mL | Freq: Two times a day (BID) | OROMUCOSAL | Status: DC
  Administered 2015-01-19 – 2015-01-23 (×8): 7 mL via OROMUCOSAL

## 2015-01-19 MED ORDER — ALBUTEROL SULFATE (2.5 MG/3ML) 0.083% IN NEBU: 2.5000 mg | INHALATION_SOLUTION | Freq: Four times a day (QID) | RESPIRATORY_TRACT | Status: DC | PRN

## 2015-01-19 MED ORDER — HEPARIN SODIUM (PORCINE) 5000 UNIT/ML IJ SOLN
5000.0000 [IU] | Freq: Three times a day (TID) | INTRAMUSCULAR | Status: DC
  Administered 2015-01-19 – 2015-01-23 (×14): 5000 [IU] via SUBCUTANEOUS
  Filled 2015-01-19 (×16): qty 1

## 2015-01-19 NOTE — H&P (Signed)
[facility] History and Physical  [name] WIO:[id number] DOB: 15-May-1940 DOA: 01/18/2015  Referring physician: EDP PCP: Sherian Maroon, MD   Chief Complaint: Abd pain   HPI: Norma Neal is a 74 y.o. female with pmh of abdominal surgeries who presents to the ED with c/o abd pain, N/V.  Some loose BMs.  Onset of pain was yesterday, worsening, located in epigastric area.  Onset of N/V this morning.  Work up in ED shows SBO due likely to adhesions.  Review of Systems: Systems reviewed.  As above, otherwise negative  Past Medical History  Diagnosis Date  . Hypertension   . Diabetes mellitus   . Dementia   . Alzheimer's disease   . Anemia of other chronic disease   . Chronic obstructive asthma, unspecified   . Esophageal reflux   . Unspecified hypothyroidism   . Nontoxic multinodular goiter   . 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 Unspecified vitamin D deficiency   . Psychotic depression   . Insomnia   . Drug induced akathisia   . Diabetic neuropathy   . Chronic kidney disease (CKD), stage III (moderate)   . Lichen planus    Past Surgical History  Procedure Laterality Date  . Abdominal hysterectomy  yrs ago    ovaries also  . Knee surgery Left yrs ago  . Partial thryoidectomy  yrs ago  . Esophagogastroduodenoscopy (egd) with propofol N/A 03/31/2014    Procedure: ESOPHAGOGASTRODUODENOSCOPY (EGD) WITH PROPOFOL;  Surgeon: Milus Banister, MD;  Location: [facility];  Service: Endoscopy;  Laterality: N/A;   Social History:  reports that she has never smoked. She has never used smokeless tobacco. She reports that she does not drink alcohol or use illicit drugs.  No Known Allergies  Family History  Problem Relation Age of Onset  . Cancer Brother     lung  . Cancer Sister     ?  Marland Kitchen Breast cancer Sister     x2  . Heart attack Sister   . Breast cancer Cousin     Neice     Prior to Admission medications   Medication Sig Start Date End Date Taking?  Authorizing Provider  albuterol (PROVENTIL HFA;VENTOLIN HFA) 108 (90 BASE) MCG/ACT inhaler Inhale 1-2 puffs into the lungs every 6 (six) hours as needed for wheezing or shortness of breath.   Yes Historical Provider, MD  aspirin 325 MG tablet Take 1 tablet (325 mg total) by mouth daily. 08/19/14   Kelby Aline, MD  docusate sodium (COLACE) 100 MG capsule Take 1 capsule (100 mg total) by mouth 2 (two) times daily. 08/21/14   Kelby Aline, MD  escitalopram (LEXAPRO) 20 MG tablet Take 20 mg by mouth daily.    Historical Provider, MD  ferrous sulfate 325 (65 FE) MG tablet Take 1 tablet (325 mg total) by mouth daily with breakfast. 08/20/14   Kelby Aline, MD  levothyroxine (SYNTHROID, LEVOTHROID) 25 MCG tablet Take 25 mcg by mouth daily.    Historical Provider, MD  lisinopril (PRINIVIL,ZESTRIL) 10 MG tablet Take 2 tablets (20 mg total) by mouth daily. 08/21/14   Kelby Aline, MD  Loperamide HCl (ANTI-DIARRHEAL PO) Take 2 tablets by mouth daily as needed. For diarrheal symptoms.    Historical Provider, MD  mirtazapine (REMERON) 15 MG tablet Take 1 tablet (15 mg total) by mouth at bedtime. 08/21/14   Kelby Aline, MD  pantoprazole (PROTONIX) 40 MG tablet Take 1 tablet (40 mg total) by mouth 2 (two)  times daily before a meal. 03/02/14   Milus Banister, MD  risperiDONE (RISPERDAL M-TABS) 0.5 MG disintegrating tablet Take 1 mg in the morning, 0.5 mg in the evening 08/21/14   Kelby Aline, MD   Physical Exam: Filed Vitals:   01/18/15 2238  BP: 190/100  Pulse: 106  Temp: 98 F (36.7 C)  Resp: 20    BP 190/100 mmHg  Pulse 106  Temp(Src) 98 F (36.7 C) (Oral)  Resp 20  SpO2 96%  General Appearance:    Alert, oriented, no distress, appears stated age  Head:    Normocephalic, atraumatic  Eyes:    PERRL, EOMI, sclera non-icteric        Nose:   Nares without drainage or epistaxis. Mucosa, turbinates normal  Throat:   Moist mucous membranes. Oropharynx without erythema or exudate.  Neck:    Supple. No carotid bruits.  No thyromegaly.  No lymphadenopathy.   Back:     No CVA tenderness, no spinal tenderness  Lungs:     Clear to auscultation bilaterally, without wheezes, rhonchi or rales  Chest wall:    No tenderness to palpitation  Heart:    Regular rate and rhythm without murmurs, gallops, rubs  Abdomen:     Soft, non-tender, nondistended, normal bowel sounds, no organomegaly  Genitalia:    deferred  Rectal:    deferred  Extremities:   No clubbing, cyanosis or edema.  Pulses:   2+ and symmetric all extremities  Skin:   Skin color, texture, turgor normal, no rashes or lesions  Lymph nodes:   Cervical, supraclavicular, and axillary nodes normal  Neurologic:   CNII-XII intact. Normal strength, sensation and reflexes      throughout    Labs on Admission:  Basic Metabolic Panel:  Recent Labs Lab 01/18/15 2106  NA 148*  K 4.0  CL 115*  CO2 24  GLUCOSE 128*  BUN 25*  CREATININE 1.52*  CALCIUM 9.7   Liver Function Tests:  Recent Labs Lab 01/18/15 2106  AST 18  ALT 12*  ALKPHOS 61  BILITOT 0.5  PROT 7.7  ALBUMIN 4.6    Recent Labs Lab 01/18/15 2106  LIPASE 14*   No results for input(s): AMMONIA in the last 168 hours. CBC:  Recent Labs Lab 01/18/15 2106  WBC 9.5  NEUTROABS 8.9*  HGB 11.4*  HCT 34.7*  MCV 88.5  PLT 187   Cardiac Enzymes: No results for input(s): CKTOTAL, CKMB, CKMBINDEX, TROPONINI in the last 168 hours.  BNP (last 3 results) No results for input(s): PROBNP in the last 8760 hours. CBG: No results for input(s): GLUCAP in the last 168 hours.  Radiological Exams on Admission: Ct Abdomen Pelvis W Contrast  01/18/2015   CLINICAL DATA:  pt from [facility], elderly day care. Pt lives at home. Co mid abd pain 10/10 per ems per pt. Also co nausea emesis. Pt received 25 mg IM phenergan and 5 mg Morphine PO at the facility.  EXAM: CT ABDOMEN AND PELVIS WITH CONTRAST  TECHNIQUE: Multidetector CT imaging of the abdomen and pelvis was  performed using the standard protocol following bolus administration of intravenous contrast.  CONTRAST:  146m OMNIPAQUE IOHEXOL 300 MG/ML  SOLN  COMPARISON:  None.  FINDINGS: Lower chest: There is mild scarring at the lung bases. Note is made of a 1.9 cm density within the central aspect of the right breast, only partially imaged on this exam. Heart size is normal. No imaged pericardial effusion or  significant coronary artery calcifications.  Upper abdomen: No focal abnormality identified within the liver, spleen, pancreas, or adrenal glands. Cyst is identified in the lower pole of the left kidney. No hydronephrosis. Gallbladder is present.  Gastrointestinal tract: Hiatal hernia is present. The stomach is otherwise normal in appearance. This proximal small bowel loops are normal in caliber. There is significant dilatation of the mid small bowel loops. The proximal and distal small bowel loops appear normal caliber. There is a transition zone of dilated to normal caliber small bowel loops in the left mid abdomen. Suspect adhesions as a cause for obstruction. No mass is identified in this region. Numerous colonic diverticula. No associated inflammation.  Pelvis: Status post hysterectomy. Small amount of free pelvic fluid.  Retroperitoneum: Atherosclerotic calcification of the abdominal aorta. No aneurysm.  Abdominal wall: Postoperative changes in the anterior abdominal wall.  Osseous structures: Facet hypertrophy in the lower lumbar levels.  IMPRESSION: 1. High-grade small bowel obstruction best localized to the left mid abdomen. Adhesions are suspected as a cause for obstruction. Small amount of free pelvic fluid. No free air. 2. Colonic diverticulosis without acute diverticulitis. 3. Hiatal hernia. 4. Question of right breast mass measuring 1.9 cm. Further evaluation with diagnostic mammogram and ultrasound at a dedicated facility is recommended when the patient is able. 5. Left lower pole renal cyst. 6. Status  post hysterectomy. 7. Abdominal aortic atherosclerosis. 8. Degenerative disc disease   Electronically Signed   By: Nolon Nations M.D.   On: 01/18/2015 23:13   Dg Abd Acute W/chest  01/18/2015   CLINICAL DATA:  Nausea, vomiting and lower abdominal pain today, history hypertension, diabetes mellitus, stage III chronic kidney disease, Alzheimer's, GERD  EXAM: DG ABDOMEN ACUTE W/ 1V CHEST  COMPARISON:  Abdominal radiograph 10/18/2013  FINDINGS: Normal heart size, mediastinal contours, and pulmonary vascularity.  Atherosclerotic calcification aorta.  Lungs clear.  No pleural effusion or pneumothorax.  Bones demineralized.  Air-filled loops of small bowel which are dilated and out of proportion to relatively decreased amount of colonic gas compatible with small bowel obstruction.  No free intraperitoneal air.  Mild degenerate disc and facet disease changes lumbar spine.  No urinary tract calcification.  IMPRESSION: Small bowel obstruction.   Electronically Signed   By: Lavonia Dana M.D.   On: 01/18/2015 19:38    EKG: Independently reviewed.  Assessment/Plan Principal Problem:   SBO (small bowel obstruction) Active Problems:   Abnormal finding on breast imaging   1. SBO - 1. Trying medical management 2. NGT 3. IVF 4. NPO 5. Surgery has seen in consult 2. ? Of R breast mass 1.9cm seen on CT abd/pelvis - 1. Diagnostic mammogram ordered, would prefer this be done inpatient if possible so this dosent get missed (patient has h/o dementia, etc).  Extensive family history of cancer including 2 siblings with BRCA.    Code Status: Full Code  Family Communication: Spoke with daughter-in-law on phone, phone number is [phone] Disposition Plan: Admit to inpatient   Time spent: 70 min  GARDNER, JARED M. [facility] Pager [phone]  If 7AM-7PM, please contact the day team taking care of the patient [email or web address] Password [facility] 01/19/2015, 12:13 AM

## 2015-01-19 NOTE — Progress Notes (Signed)
I have seen and assessed the patient and agree with Dr Boston Service assessment and plan.Patient presenting with SBO. Currently NPO with conservative measures. CCS ff.

## 2015-01-19 NOTE — Progress Notes (Signed)
  Progress Note: General Surgery Service   Subjective: No nausea currently, hungry and eating ice. No BM or flatus since hospital admission  Objective: Vital signs in last 24 hours: Temp:  [98 F (36.7 C)-98.9 F (37.2 C)] 98.9 F (37.2 C) (09/22 0545) Pulse Rate:  [98-106] 105 (09/22 0545) Resp:  [18-27] 18 (09/22 0545) BP: (160-211)/(74-110) 160/74 mmHg (09/22 0545) SpO2:  [96 %-100 %] 98 % (09/22 0545) Weight:  [58.559 kg (129 lb 1.6 oz)] 58.559 kg (129 lb 1.6 oz) (09/22 0157)    Intake/Output from previous day: 09/21 0701 - 09/22 0700 In: 0  Out: 500 [Urine:500] Intake/Output this shift: Total I/O In: 0  Out: 150 [Urine:150]  Lungs: CTAB  Cardiovascular: tachycardic  Abd: soft, NT, ND  Extremities: no edema  Neuro: AOx4  Lab Results: CBC   Recent Labs  01/18/15 2106  WBC 9.5  HGB 11.4*  HCT 34.7*  PLT 187   BMET  Recent Labs  01/18/15 2106  NA 148*  K 4.0  CL 115*  CO2 24  GLUCOSE 128*  BUN 25*  CREATININE 1.52*  CALCIUM 9.7   PT/INR No results for input(s): LABPROT, INR in the last 72 hours. ABG No results for input(s): PHART, HCO3 in the last 72 hours.  Invalid input(s): PCO2, PO2  Studies/Results:  Anti-infectives: Anti-infectives    None      Medications: Scheduled Meds: . antiseptic oral rinse  7 mL Mouth Rinse q12n4p  . chlorhexidine  15 mL Mouth Rinse BID  . enalaprilat  0.625 mg Intravenous Q6H  . escitalopram  20 mg Oral Daily  . heparin  5,000 Units Subcutaneous 3 times per day  . levothyroxine  12.5 mcg Intravenous Daily  . mirtazapine  15 mg Oral QHS  . pantoprazole (PROTONIX) IV  40 mg Intravenous Q12H  . risperiDONE  0.5 mg Oral QHS  . [START ON 01/20/2015] risperiDONE  1 mg Oral Daily   Continuous Infusions: . sodium chloride 125 mL/hr at 01/19/15 0129   PRN Meds:.albuterol, hydrALAZINE, morphine injection  Assessment/Plan: Patient Active Problem List   Diagnosis Date Noted  . SBO (small bowel  obstruction) 01/19/2015  . Abnormal finding on breast imaging 01/19/2015  . Alzheimer's disease 08/18/2014  . Hypothyroidism 08/18/2014  . Atypical chest pain 08/18/2014  . Ataxia   . Other specified transient cerebral ischemias   . CKD (chronic kidney disease) stage 3, GFR 30-59 ml/min   . TIA (transient ischemic attack) 08/17/2014  . Acute-on-chronic kidney injury 08/17/2014  . Anemia of chronic disease 08/17/2014  . Elevated CK 08/17/2014   pSBO  Symptoms appear to have resolved -if vomits then NG tube -if doesn't vomit today and has flatus hopefully will continue nonoperative management   LOS: 0 days   Rodman Pickle, MD Pg# [phone] [facility]

## 2015-01-19 NOTE — ED Notes (Signed)
Dr Beacher May given patient family contact info, requested them be called and updated

## 2015-01-20 DIAGNOSIS — N183 Chronic kidney disease, stage 3 (moderate): Secondary | ICD-10-CM

## 2015-01-20 DIAGNOSIS — N179 Acute kidney failure, unspecified: Secondary | ICD-10-CM

## 2015-01-20 DIAGNOSIS — E039 Hypothyroidism, unspecified: Secondary | ICD-10-CM

## 2015-01-20 LAB — CBC
HCT: 29.3 % — ABNORMAL LOW (ref 36.0–46.0)
Hemoglobin: 9.7 g/dL — ABNORMAL LOW (ref 12.0–15.0)
MCH: 29.6 pg (ref 26.0–34.0)
MCHC: 33.1 g/dL (ref 30.0–36.0)
MCV: 89.3 fL (ref 78.0–100.0)
PLATELETS: 168 10*3/uL (ref 150–400)
RBC: 3.28 MIL/uL — ABNORMAL LOW (ref 3.87–5.11)
RDW: 13.8 % (ref 11.5–15.5)
WBC: 5.7 10*3/uL (ref 4.0–10.5)

## 2015-01-20 LAB — BASIC METABOLIC PANEL
ANION GAP: 11 (ref 5–15)
BUN: 19 mg/dL (ref 6–20)
CALCIUM: 8.7 mg/dL — AB (ref 8.9–10.3)
CO2: 17 mmol/L — ABNORMAL LOW (ref 22–32)
CREATININE: 1.18 mg/dL — AB (ref 0.44–1.00)
Chloride: 116 mmol/L — ABNORMAL HIGH (ref 101–111)
GFR, EST AFRICAN AMERICAN: 51 mL/min — AB (ref >60)
GFR, EST NON AFRICAN AMERICAN: 44 mL/min — AB (ref >60)
Glucose, Bld: 66 mg/dL (ref 65–99)
Potassium: 3.5 mmol/L (ref 3.5–5.1)
SODIUM: 144 mmol/L (ref 135–145)

## 2015-01-20 LAB — MAGNESIUM: MAGNESIUM: 2.1 mg/dL (ref 1.7–2.4)

## 2015-01-20 MED ORDER — METOPROLOL TARTRATE 1 MG/ML IV SOLN
5.0000 mg | Freq: Three times a day (TID) | INTRAVENOUS | Status: DC
  Administered 2015-01-20 – 2015-01-21 (×4): 5 mg via INTRAVENOUS
  Filled 2015-01-20 (×6): qty 5

## 2015-01-20 MED ORDER — POTASSIUM CHLORIDE 10 MEQ/100ML IV SOLN
10.0000 meq | INTRAVENOUS | Status: AC
  Administered 2015-01-20 (×4): 10 meq via INTRAVENOUS
  Filled 2015-01-20 (×6): qty 100

## 2015-01-20 MED ORDER — SODIUM BICARBONATE 8.4 % IV SOLN
INTRAVENOUS | Status: DC
  Administered 2015-01-20 – 2015-01-22 (×3): via INTRAVENOUS
  Filled 2015-01-20 (×6): qty 1000

## 2015-01-20 MED ORDER — ACETAMINOPHEN 325 MG RE SUPP
325.0000 mg | RECTAL | Status: DC | PRN
  Administered 2015-01-21 – 2015-01-22 (×3): 325 mg via RECTAL
  Filled 2015-01-20 (×8): qty 1

## 2015-01-20 NOTE — Progress Notes (Signed)
[facility] PROGRESS NOTE  Norma Neal RUE:[id number] DOB: Sep 11, 1940 DOA: 01/18/2015 PCP: Thane Edu, MD  Assessment/Plan: #1 small bowel obstruction Questionable etiology. Likely secondary to possible adhesions. Patient with slow clinical improvement. Patient with no emesis. Patient NG tube has been removed. Patient with positive flatus. No bowel movement. Continue nothing by mouth, IV fluids. Supportive care. Keep magnesium greater than 2. Keep potassium around 4. Mobilize. General surgery following and appreciate able to recommendations.  #2 hypertension Patient currently on enalapril IV. Patient with systolic blood pressures in the 180s to 190s. Will add IV Lopressor. Follow.  #3 acute on chronic kidney disease stage III Likely secondary to prerenal azotemia. Improvement with hydration. Follow.  #4 ?? Right breast mass seen on CT abdomen and pelvis Diagnostic mammogram pending.  #5  hypothyroidism Continue IV Synthroid.  #6 prophylaxis PPI for GI prophylaxis. SCDs for DVT prophylaxis.  Code Status: Full Family Communication: Updated patient. No family present. Disposition Plan: Home once small bowel obstruction has resolved.   Consultants:  Gen. surgery: Dr. Maisie Fus 01/18/2015  Procedures:  CT abdomen and pelvis 01/18/2015  Acute abdominal series 01/18/2015  Antibiotics:  None  HPI/Subjective: Patient denies any nausea. Patient states abdominal pain improved. Patient denies any emesis. Patient endorses flatus. Patient denies any bowel movement. Patient states has been ambulating in the hallway.  Objective: Filed Vitals:   01/20/15 1635  BP: 162/72  Pulse:   Temp:   Resp:     Intake/Output Summary (Last 24 hours) at 01/20/15 1640 Last data filed at 01/20/15 1613  Gross per 24 hour  Intake 3719.58 ml  Output   1675 ml  Net 2044.58 ml   Filed Weights   01/19/15 0157  Weight: 58.559 kg (129 lb 1.6 oz)    Exam:   General:   NAD  Cardiovascular: RRR  Respiratory: CTAB  Abdomen: Soft, nontender, nondistended, positive bowel sounds.  Musculoskeletal: No clubbing cyanosis or edema.  Data Reviewed: Basic Metabolic Panel:  Recent Labs Lab 01/18/15 2106 01/19/15 0942 01/20/15 0515  NA 148* 146* 144  K 4.0 3.7 3.5  CL 115* 118* 116*  CO2 24 21* 17*  GLUCOSE 128* 95 66  BUN 25* 20 19  CREATININE 1.52* 1.24* 1.18*  CALCIUM 9.7 8.8* 8.7*  MG  --  2.1 2.1   Liver Function Tests:  Recent Labs Lab 01/18/15 2106  AST 18  ALT 12*  ALKPHOS 61  BILITOT 0.5  PROT 7.7  ALBUMIN 4.6    Recent Labs Lab 01/18/15 2106  LIPASE 14*   No results for input(s): AMMONIA in the last 168 hours. CBC:  Recent Labs Lab 01/18/15 2106 01/19/15 0942 01/20/15 0515  WBC 9.5 6.2 5.7  NEUTROABS 8.9*  --   --   HGB 11.4* 9.7* 9.7*  HCT 34.7* 29.3* 29.3*  MCV 88.5 89.3 89.3  PLT 187 168 168   Cardiac Enzymes: No results for input(s): CKTOTAL, CKMB, CKMBINDEX, TROPONINI in the last 168 hours. BNP (last 3 results) No results for input(s): BNP in the last 8760 hours.  ProBNP (last 3 results) No results for input(s): PROBNP in the last 8760 hours.  CBG: No results for input(s): GLUCAP in the last 168 hours.  No results found for this or any previous visit (from the past 240 hour(s)).   Studies: Ct Abdomen Pelvis W Contrast  01/18/2015   CLINICAL DATA:  pt from [facility], elderly day care. Pt lives at home. Co mid abd pain 10/10 per ems per  pt. Also co nausea emesis. Pt received 25 mg IM phenergan and 5 mg Morphine PO at the facility.  EXAM: CT ABDOMEN AND PELVIS WITH CONTRAST  TECHNIQUE: Multidetector CT imaging of the abdomen and pelvis was performed using the standard protocol following bolus administration of intravenous contrast.  CONTRAST:  OMNIPAQUE IOHEXOL 300 MG/ML  SOLN  COMPARISON:  None.  FINDINGS: Lower chest: There is mild scarring at the lung bases. Note is made of a 1.9 cm density  within the central aspect of the right breast, only partially imaged on this exam. Heart size is normal. No imaged pericardial effusion or significant coronary artery calcifications.  Upper abdomen: No focal abnormality identified within the liver, spleen, pancreas, or adrenal glands. Cyst is identified in the lower pole of the left kidney. No hydronephrosis. Gallbladder is present.  Gastrointestinal tract: Hiatal hernia is present. The stomach is otherwise normal in appearance. This proximal small bowel loops are normal in caliber. There is significant dilatation of the mid small bowel loops. The proximal and distal small bowel loops appear normal caliber. There is a transition zone of dilated to normal caliber small bowel loops in the left mid abdomen. Suspect adhesions as a cause for obstruction. No mass is identified in this region. Numerous colonic diverticula. No associated inflammation.  Pelvis: Status post hysterectomy. Small amount of free pelvic fluid.  Retroperitoneum: Atherosclerotic calcification of the abdominal aorta. No aneurysm.  Abdominal wall: Postoperative changes in the anterior abdominal wall.  Osseous structures: Facet hypertrophy in the lower lumbar levels.  IMPRESSION: 1. High-grade small bowel obstruction best localized to the left mid abdomen. Adhesions are suspected as a cause for obstruction. Small amount of free pelvic fluid. No free air. 2. Colonic diverticulosis without acute diverticulitis. 3. Hiatal hernia. 4. Question of right breast mass measuring 1.9 cm. Further evaluation with diagnostic mammogram and ultrasound at a dedicated facility is recommended when the patient is able. 5. Left lower pole renal cyst. 6. Status post hysterectomy. 7. Abdominal aortic atherosclerosis. 8. Degenerative disc disease   Electronically Signed   By: Norva Pavlov M.D.   On: 01/18/2015 23:13   Dg Abd Acute W/chest  01/18/2015   CLINICAL DATA:  Nausea, vomiting and lower abdominal pain today,  history hypertension, diabetes mellitus, stage III chronic kidney disease, Alzheimer's, GERD  EXAM: DG ABDOMEN ACUTE W/ 1V CHEST  COMPARISON:  Abdominal radiograph 10/18/2013  FINDINGS: Normal heart size, mediastinal contours, and pulmonary vascularity.  Atherosclerotic calcification aorta.  Lungs clear.  No pleural effusion or pneumothorax.  Bones demineralized.  Air-filled loops of small bowel which are dilated and out of proportion to relatively decreased amount of colonic gas compatible with small bowel obstruction.  No free intraperitoneal air.  Mild degenerate disc and facet disease changes lumbar spine.  No urinary tract calcification.  IMPRESSION: Small bowel obstruction.   Electronically Signed   By: Ulyses Southward M.D.   On: 01/18/2015 19:38    Scheduled Meds: . antiseptic oral rinse  7 mL Mouth Rinse q12n4p  . chlorhexidine  15 mL Mouth Rinse BID  . enalaprilat  1.25 mg Intravenous Q6H  . escitalopram  20 mg Oral Daily  . heparin  5,000 Units Subcutaneous 3 times per day  . levothyroxine  12.5 mcg Intravenous Daily  . metoprolol  5 mg Intravenous 3 times per day  . mirtazapine  15 mg Oral QHS  . pantoprazole (PROTONIX) IV  40 mg Intravenous Q12H  . risperiDONE  0.5 mg Oral QHS  .  risperiDONE  1 mg Oral Daily   Continuous Infusions: . dextrose 5 % 1,000 mL with potassium chloride 40 mEq, sodium bicarbonate 150 mEq infusion 100 mL/hr at 01/20/15 1105    Principal Problem:   SBO (small bowel obstruction) Active Problems:   Hypothyroidism   Abnormal finding on breast imaging   CKD (chronic kidney disease)   Secondary hypertension, unspecified    Time spent: 35 minutes    THOMPSON,DANIEL M.D. [facility] Pager [phone]. If 7PM-7AM, please contact night-coverage at [email or web address], password [facility] 01/20/2015, 4:40 PM  LOS: 1 day

## 2015-01-20 NOTE — Clinical Documentation Improvement (Signed)
General Surgery Internal Medicine  Based on the clinical findings below, please document any associated diagnoses/conditions the patient has or may have.   CKD3 as documented  Acute on Chronic Kidney Disease Stage 3  ARF/AKI  Other  Clinically Undetermined  Supporting Information:  Creatinine levels have dropped from 1.52 on admission to 1.18 over a 2 day period  PIV of 0.9% NaCl infusing at 125 cc/hr  Please exercise your independent, professional judgment when responding. A specific answer is not anticipated or expected.  Thank You, Shellee Milo BSN, RN Health Information Management [place] [phone]

## 2015-01-20 NOTE — Progress Notes (Signed)
Dear Doctor: ThompsonThis patient has been identified as a candidate for PICC for the following reason (s): IV therapy over 48 hours, drug pH or osmolality (causing phlebitis, infiltration in 24 hours), poor veins/poor circulatory system (CHF, COPD, emphysema, diabetes, steroid use, IV drug abuse, etc.) and restarts due to phlebitis and infiltration in 24 hours If you agree, please write an order for the indicated device. For any questions contact the Vascular Access Team at [phone] if no answer, please leave a message.  Thank you for supporting the early vascular access assessment program.

## 2015-01-21 LAB — BASIC METABOLIC PANEL
ANION GAP: 10 (ref 5–15)
BUN: 13 mg/dL (ref 6–20)
CO2: 25 mmol/L (ref 22–32)
Calcium: 8.9 mg/dL (ref 8.9–10.3)
Chloride: 108 mmol/L (ref 101–111)
Creatinine, Ser: 1.1 mg/dL — ABNORMAL HIGH (ref 0.44–1.00)
GFR, EST AFRICAN AMERICAN: 56 mL/min — AB (ref >60)
GFR, EST NON AFRICAN AMERICAN: 48 mL/min — AB (ref >60)
GLUCOSE: 98 mg/dL (ref 65–99)
POTASSIUM: 3.4 mmol/L — AB (ref 3.5–5.1)
Sodium: 143 mmol/L (ref 135–145)

## 2015-01-21 LAB — MAGNESIUM: Magnesium: 1.9 mg/dL (ref 1.7–2.4)

## 2015-01-21 MED ORDER — POTASSIUM CHLORIDE 10 MEQ/100ML IV SOLN
10.0000 meq | INTRAVENOUS | Status: AC
  Administered 2015-01-21 (×4): 10 meq via INTRAVENOUS
  Filled 2015-01-21 (×4): qty 100

## 2015-01-21 MED ORDER — METOPROLOL TARTRATE 1 MG/ML IV SOLN
7.5000 mg | Freq: Three times a day (TID) | INTRAVENOUS | Status: DC
  Administered 2015-01-21 – 2015-01-22 (×2): 7.5 mg via INTRAVENOUS
  Filled 2015-01-21 (×5): qty 10

## 2015-01-21 NOTE — Progress Notes (Signed)
Contacted Dr. Janee Morn re: pt's persistent HTN and he is reviewing pt's stats.  Gave report to night nurse who will continue to monitor and will follow-up on any new orders.

## 2015-01-21 NOTE — Progress Notes (Signed)
[facility] PROGRESS NOTE  Norma Neal WUJ:[id number] DOB: [birth year] DOA: 01/18/2015 PCP: Thane Edu, MD  Assessment/Plan: #1 small bowel obstruction Questionable etiology. Likely secondary to possible adhesions. Patient with slow clinical improvement. Patient with no emesis. Patient NG tube has been removed. Patient with positive flatus. Patient states had a small bowel movement yesterday and none today. Patient denies any abdominal pain. Continue nothing by mouth, IV fluids. Supportive care. Keep magnesium greater than 2. Keep potassium around 4. Mobilize. General surgery following and appreciate able to recommendations. Ambulate.  #2 hypertension Continue IV enalapril and IV Lopressor.  #3 acute on chronic kidney disease stage III Likely secondary to prerenal azotemia. Improvement with hydration. Follow.  #4 ?? Right breast mass seen on CT abdomen and pelvis Diagnostic mammogram pending.  #5  hypothyroidism Continue IV Synthroid.  #6 prophylaxis PPI for GI prophylaxis. SCDs for DVT prophylaxis.  Code Status: Full Family Communication: Updated patient. No family present. Disposition Plan: Home once small bowel obstruction has resolved.   Consultants:  Gen. surgery: Dr. Maisie Fus 01/18/2015  Procedures:  CT abdomen and pelvis 01/18/2015  Acute abdominal series 01/18/2015  Antibiotics:  None  HPI/Subjective: Patient with small bowel movement yesterday. Patient also stated pass flatus last night. No bowel movement this morning. Improvement with abdominal pain. Patient tolerating clear liquids.  Objective: Filed Vitals:   01/21/15 1037  BP: 149/68  Pulse: 97  Temp:   Resp:     Intake/Output Summary (Last 24 hours) at 01/21/15 1157 Last data filed at 01/21/15 0700  Gross per 24 hour  Intake      0 ml  Output   1250 ml  Net  -1250 ml   Filed Weights   01/19/15 0157  Weight: 58.559 kg (129 lb 1.6 oz)    Exam:   General:   NAD  Cardiovascular: RRR  Respiratory: CTAB  Abdomen: Soft, nontender, nondistended, positive bowel sounds.  Musculoskeletal: No clubbing cyanosis or edema.  Data Reviewed: Basic Metabolic Panel:  Recent Labs Lab 01/18/15 2106 01/19/15 0942 01/20/15 0515 01/21/15 0536  NA 148* 146* 144 143  K 4.0 3.7 3.5 3.4*  CL 115* 118* 116* 108  CO2 24 21* 17* 25  GLUCOSE 128* 95 66 98  BUN 25* CREATININE 1.52* 1.24* 1.18* 1.10*  CALCIUM 9.7 8.8* 8.7* 8.9  MG  --  2.1 2.1 1.9   Liver Function Tests:  Recent Labs Lab 01/18/15 2106  AST 18  ALT 12*  ALKPHOS 61  BILITOT 0.5  PROT 7.7  ALBUMIN 4.6    Recent Labs Lab 01/18/15 2106  LIPASE 14*   No results for input(s): AMMONIA in the last 168 hours. CBC:  Recent Labs Lab 01/18/15 2106 01/19/15 0942 01/20/15 0515  WBC 9.5 6.2 5.7  NEUTROABS 8.9*  --   --   HGB 11.4* 9.7* 9.7*  HCT 34.7* 29.3* 29.3*  MCV 88.5 89.3 89.3  PLT 187 168 168   Cardiac Enzymes: No results for input(s): CKTOTAL, CKMB, CKMBINDEX, TROPONINI in the last 168 hours. BNP (last 3 results) No results for input(s): BNP in the last 8760 hours.  ProBNP (last 3 results) No results for input(s): PROBNP in the last 8760 hours.  CBG: No results for input(s): GLUCAP in the last 168 hours.  No results found for this or any previous visit (from the past 240 hour(s)).   Studies: No results found.  Scheduled Meds: . antiseptic oral rinse  7 mL Mouth Rinse q12n4p  . chlorhexidine  15 mL Mouth Rinse BID  . enalaprilat  1.25 mg Intravenous Q6H  . escitalopram  20 mg Oral Daily  . heparin  5,000 Units Subcutaneous 3 times per day  . levothyroxine  12.5 mcg Intravenous Daily  . metoprolol  5 mg Intravenous 3 times per day  . mirtazapine  15 mg Oral QHS  . pantoprazole (PROTONIX) IV  40 mg Intravenous Q12H  . potassium chloride  10 mEq Intravenous Q1 Hr x 4  . risperiDONE  0.5 mg Oral QHS  . risperiDONE  1 mg Oral Daily   Continuous  Infusions: . dextrose 5 % 1,000 mL with potassium chloride 40 mEq, sodium bicarbonate 150 mEq infusion 75 mL/hr at 01/21/15 1914    Principal Problem:   SBO (small bowel obstruction) Active Problems:   Hypothyroidism   Abnormal finding on breast imaging   CKD (chronic kidney disease)   Secondary hypertension, unspecified    Time spent: 35 minutes    THOMPSON,DANIEL M.D. [facility] Pager [phone]. If 7PM-7AM, please contact night-coverage at [email or web address], password [email or web address] 01/21/2015, 11:57 AM  LOS: 2 days

## 2015-01-21 NOTE — Progress Notes (Signed)
Patient ID: Norma Neal, female   DOB: 09-21-1940, 74 y.o.   MRN: [id number]  General Surgery - [facility]  HD#: 4  Subjective: Patient in bed.  Small loose BM last night, passing flatus.  No nausea or emesis overnight.  No abdominal pain this AM.  Objective: Vital signs in last 24 hours: Temp:  [98.9 F (37.2 C)-99.3 F (37.4 C)] 99 F (37.2 C) (09/24 0546) Pulse Rate:  [92-108] 103 (09/24 0546) Resp:  [18-20] 20 (09/24 0546) BP: (155-192)/(68-87) 155/73 mmHg (09/24 0546) SpO2:  [99 %-100 %] 99 % (09/24 0546) Last BM Date: 01/20/15  Intake/Output from previous day: 09/23 0701 - 09/24 0700 In: 0  Out: 1525 [Urine:1525] Intake/Output this shift:    Physical Exam: HEENT - sclerae clear, mucous membranes moist Neck - soft Chest - clear bilaterally Cor - RRR Abdomen - soft without distension; BS present; non-tender Ext - no edema, non-tender Neuro - alert & oriented, no focal deficits  Lab Results:   Recent Labs  01/19/15 0942 01/20/15 0515  WBC 6.2 5.7  HGB 9.7* 9.7*  HCT 29.3* 29.3*  PLT 168 168   BMET  Recent Labs  01/20/15 0515 01/21/15 0536  NA 144 143  K 3.5 3.4*  CL 116* 108  CO2 17* 25  GLUCOSE 66 98  BUN 19 13  CREATININE 1.18* 1.10*  CALCIUM 8.7* 8.9   PT/INR No results for input(s): LABPROT, INR in the last 72 hours. Comprehensive Metabolic Panel:    Component Value Date/Time   NA 143 01/21/2015 0536   NA 144 01/20/2015 0515   K 3.4* 01/21/2015 0536   K 3.5 01/20/2015 0515   CL 108 01/21/2015 0536   CL 116* 01/20/2015 0515   CO2 25 01/21/2015 0536   CO2 17* 01/20/2015 0515   BUN 13 01/21/2015 0536   BUN 19 01/20/2015 0515   CREATININE 1.10* 01/21/2015 0536   CREATININE 1.18* 01/20/2015 0515   GLUCOSE 98 01/21/2015 0536   GLUCOSE 66 01/20/2015 0515   CALCIUM 8.9 01/21/2015 0536   CALCIUM 8.7* 01/20/2015 0515   AST 18 01/18/2015 2106   AST 21 08/17/2014 1714   ALT 12* 01/18/2015 2106   ALT 11 08/17/2014 1714    ALKPHOS 61 01/18/2015 2106   ALKPHOS 69 08/17/2014 1714   BILITOT 0.5 01/18/2015 2106   BILITOT 0.7 08/17/2014 1714   PROT 7.7 01/18/2015 2106   PROT 6.6 08/17/2014 1714   ALBUMIN 4.6 01/18/2015 2106   ALBUMIN 3.8 08/17/2014 1714    Studies/Results: No results found.  Anti-infectives: Anti-infectives    None      Assessment & Plans: Small bowel obstruction, likely secondary to adhesions  Clinically improving  Will try clear liquids this morning  Encouraged OOB, ambulation  Will follow  Velora Heckler, MD, [facility] Office: [phone]   GERKIN,TODD Judie Petit 01/21/2015

## 2015-01-22 ENCOUNTER — Inpatient Hospital Stay (HOSPITAL_COMMUNITY): Payer: Medicare (Managed Care)

## 2015-01-22 LAB — CBC
HEMATOCRIT: 30.3 % — AB (ref 36.0–46.0)
Hemoglobin: 10 g/dL — ABNORMAL LOW (ref 12.0–15.0)
MCH: 29 pg (ref 26.0–34.0)
MCHC: 33 g/dL (ref 30.0–36.0)
MCV: 87.8 fL (ref 78.0–100.0)
PLATELETS: 178 10*3/uL (ref 150–400)
RBC: 3.45 MIL/uL — ABNORMAL LOW (ref 3.87–5.11)
RDW: 13.6 % (ref 11.5–15.5)
WBC: 5.7 10*3/uL (ref 4.0–10.5)

## 2015-01-22 LAB — MAGNESIUM: Magnesium: 1.8 mg/dL (ref 1.7–2.4)

## 2015-01-22 LAB — BASIC METABOLIC PANEL
Anion gap: 8 (ref 5–15)
BUN: 10 mg/dL (ref 6–20)
CALCIUM: 8.8 mg/dL — AB (ref 8.9–10.3)
CO2: 29 mmol/L (ref 22–32)
Chloride: 105 mmol/L (ref 101–111)
Creatinine, Ser: 1.11 mg/dL — ABNORMAL HIGH (ref 0.44–1.00)
GFR calc Af Amer: 55 mL/min — ABNORMAL LOW (ref >60)
GFR, EST NON AFRICAN AMERICAN: 48 mL/min — AB (ref >60)
GLUCOSE: 128 mg/dL — AB (ref 65–99)
Potassium: 3.8 mmol/L (ref 3.5–5.1)
Sodium: 142 mmol/L (ref 135–145)

## 2015-01-22 MED ORDER — METOPROLOL TARTRATE 1 MG/ML IV SOLN
10.0000 mg | Freq: Three times a day (TID) | INTRAVENOUS | Status: DC
  Administered 2015-01-22 – 2015-01-23 (×4): 10 mg via INTRAVENOUS
  Filled 2015-01-22 (×6): qty 10

## 2015-01-22 MED ORDER — HYDRALAZINE HCL 20 MG/ML IJ SOLN
20.0000 mg | INTRAMUSCULAR | Status: DC | PRN
  Filled 2015-01-22: qty 1

## 2015-01-22 MED ORDER — DOCUSATE SODIUM 100 MG PO CAPS
100.0000 mg | ORAL_CAPSULE | Freq: Two times a day (BID) | ORAL | Status: DC
  Administered 2015-01-22 (×2): 100 mg via ORAL
  Filled 2015-01-22 (×4): qty 1

## 2015-01-22 MED ORDER — POTASSIUM CHLORIDE 10 MEQ/100ML IV SOLN
10.0000 meq | INTRAVENOUS | Status: DC
  Filled 2015-01-22 (×4): qty 100

## 2015-01-22 MED ORDER — ACETAMINOPHEN 325 MG PO TABS
650.0000 mg | ORAL_TABLET | ORAL | Status: DC | PRN
Start: 2015-01-22 — End: 2015-01-23
  Administered 2015-01-22 – 2015-01-23 (×3): 650 mg via ORAL
  Filled 2015-01-22 (×3): qty 2

## 2015-01-22 MED ORDER — POTASSIUM CHLORIDE 10 MEQ/100ML IV SOLN
10.0000 meq | INTRAVENOUS | Status: AC
  Administered 2015-01-22 (×4): 10 meq via INTRAVENOUS
  Filled 2015-01-22 (×4): qty 100

## 2015-01-22 MED ORDER — HYDRALAZINE HCL 20 MG/ML IJ SOLN
10.0000 mg | Freq: Once | INTRAMUSCULAR | Status: AC
  Administered 2015-01-22: 10 mg via INTRAVENOUS

## 2015-01-22 NOTE — Progress Notes (Signed)
[facility] PROGRESS NOTE  Norma Neal WUJ:[id number] DOB: 1940-05-16 DOA: 01/18/2015 PCP: Norma Edu, MD  Assessment/Plan: #1 small bowel obstruction Questionable etiology. Likely secondary to possible adhesions. Patient with clinical improvement. Patient with no emesis. Patient NG tube has been removed. Patient with positive flatus. Patient states had a small bowel movement  last night. Patient states some abdominal pain with food today. Patient's diet has been advanced to a regular diet. KUB with improvement. Continue gentle IV fluids. Supportive care. Keep magnesium greater than 2. Keep potassium around 4. Mobilize. General surgery following and appreciate able to recommendations. Ambulate.  #2 hypertension Continue IV enalapril. Increase IV Lopressor to 10 mg every 8 hours.   #3 acute on chronic kidney disease stage III Likely secondary to prerenal azotemia. Improvement with hydration. Currently at baseline. Follow.  #4 ?? Right breast mass seen on CT abdomen and pelvis Diagnostic mammogram pending.  #5  hypothyroidism Continue IV Synthroid.  #6 prophylaxis PPI for GI prophylaxis. SCDs for DVT prophylaxis.  Code Status: Full Family Communication: Updated patient. No family present. Disposition Plan: Home once small bowel obstruction has resolved.   Consultants:  Gen. surgery: Dr. Maisie Fus 01/18/2015  Procedures:  CT abdomen and pelvis 01/18/2015  Acute abdominal series 01/18/2015, 01/22/2015  Antibiotics:  None  HPI/Subjective: Patient with small BM yesterday. + flatus, Patient states a little abdominal pain with her food today, with no emesis or nausea. No CP.  Objective: Filed Vitals:   01/22/15 1111  BP: 171/77  Pulse: 86  Temp:   Resp:     Intake/Output Summary (Last 24 hours) at 01/22/15 1414 Last data filed at 01/22/15 1100  Gross per 24 hour  Intake    800 ml  Output   1275 ml  Net   -475 ml   Filed Weights   01/19/15 0157   Weight: 58.559 kg (129 lb 1.6 oz)    Exam:   General:  NAD  Cardiovascular: RRR  Respiratory: CTAB  Abdomen: Soft, nontender, nondistended, positive bowel sounds.  Musculoskeletal: No clubbing cyanosis or edema.  Data Reviewed: Basic Metabolic Panel:  Recent Labs Lab 01/18/15 2106 01/19/15 0942 01/20/15 0515 01/21/15 0536 01/22/15 0519  NA 148* 146* 144 143 142  K 4.0 3.7 3.5 3.4* 3.8  CL 115* 118* 116* 108 105  CO2 24 21* 17* 25 29  GLUCOSE 128* 95 66 98 128*  BUN 25* CREATININE 1.52* 1.24* 1.18* 1.10* 1.11*  CALCIUM 9.7 8.8* 8.7* 8.9 8.8*  MG  --  2.1 2.1 1.9 1.8   Liver Function Tests:  Recent Labs Lab 01/18/15 2106  AST 18  ALT 12*  ALKPHOS 61  BILITOT 0.5  PROT 7.7  ALBUMIN 4.6    Recent Labs Lab 01/18/15 2106  LIPASE 14*   No results for input(s): AMMONIA in the last 168 hours. CBC:  Recent Labs Lab 01/18/15 2106 01/19/15 0942 01/20/15 0515 01/22/15 0519  WBC 9.5 6.2 5.7 5.7  NEUTROABS 8.9*  --   --   --   HGB 11.4* 9.7* 9.7* 10.0*  HCT 34.7* 29.3* 29.3* 30.3*  MCV 88.5 89.3 89.3 87.8  PLT 187 168 168 178   Cardiac Enzymes: No results for input(s): CKTOTAL, CKMB, CKMBINDEX, TROPONINI in the last 168 hours. BNP (last 3 results) No results for input(s): BNP in the last 8760 hours.  ProBNP (last 3 results) No results for input(s): PROBNP in the last 8760 hours.  CBG: No results for input(s): GLUCAP in the  last 168 hours.  No results found for this or any previous visit (from the past 240 hour(s)).   Studies: Dg Abd 2 Views  01/22/2015   CLINICAL DATA:  Small bowel obstruction  EXAM: ABDOMEN - 2 VIEW  COMPARISON:  01/18/2015 CT and plain films  FINDINGS: Decreasing gaseous distention of small bowel loops. Gas and stool noted within colon currently. No free air organomegaly. No suspicious calcification.  IMPRESSION: Decreasing gaseous distension of small bowel. Small bowel loops currently not dilated.    Electronically Signed   By: Charlett Nose M.D.   On: 01/22/2015 09:25    Scheduled Meds: . antiseptic oral rinse  7 mL Mouth Rinse q12n4p  . chlorhexidine  15 mL Mouth Rinse BID  . enalaprilat  1.25 mg Intravenous Q6H  . escitalopram  20 mg Oral Daily  . heparin  5,000 Units Subcutaneous 3 times per day  . levothyroxine  12.5 mcg Intravenous Daily  . metoprolol  10 mg Intravenous 3 times per day  . mirtazapine  15 mg Oral QHS  . pantoprazole (PROTONIX) IV  40 mg Intravenous Q12H  . potassium chloride  10 mEq Intravenous Q2H  . risperiDONE  0.5 mg Oral QHS  . risperiDONE  1 mg Oral Daily   Continuous Infusions: . dextrose 5 % 1,000 mL with potassium chloride 40 mEq, sodium bicarbonate 150 mEq infusion 50 mL/hr at 01/22/15 1100    Principal Problem:   SBO (small bowel obstruction) Active Problems:   Hypothyroidism   Abnormal finding on breast imaging   CKD (chronic kidney disease)   Secondary hypertension, unspecified    Time spent: 35 minutes    [name] M.D. [facility] Pager [phone]. If 7PM-7AM, please contact night-coverage at [email or web address], password [facility] 01/22/2015, 2:14 PM  LOS: 3 days

## 2015-01-22 NOTE — Progress Notes (Signed)
Patient ID: Norma Neal, female   DOB: [birth year], 74 y.o.   MRN: [id number]  General Surgery - [facility]  HD#: 5   Subjective: Patient comfortable this AM.  Mild mid abdominal pain.  No nausea.  Passing flatus per patient.  Objective: Vital signs in last 24 hours: Temp:  [98.4 F (36.9 C)-98.8 F (37.1 C)] 98.4 F (36.9 C) 2023-02-08 0500) Pulse Rate:  [81-108] 108 2023-02-08 0500) Resp:  [18] 18 08-Feb-2023 0500) BP: (149-203)/(68-104) 161/78 mmHg 2023-02-08 0500) SpO2:  [98 %-99 %] 98 % 08-Feb-2023 0500) Last BM Date: 01/20/15  Intake/Output from previous day: 09/24 0701 - 08-Feb-2023 0700 In: 1376 [I.V.:976; IV Piggyback:400] Out: 975 [Urine:975] Intake/Output this shift:    Physical Exam: HEENT - sclerae clear, mucous membranes moist Neck - soft Chest - clear bilaterally Cor - RRR Abdomen - soft without distension; non-tender Ext - no edema, non-tender Neuro - alert & oriented, no focal deficits  Lab Results:   Recent Labs  01/20/15 0515 02/08/15 0519  WBC 5.7 5.7  HGB 9.7* 10.0*  HCT 29.3* 30.3*  PLT 168 178   BMET  Recent Labs  01/21/15 0536 2015/02/08 0519  NA 143 142  K 3.4* 3.8  CL 108 105  CO2 25 29  GLUCOSE 98 128*  BUN 13 10  CREATININE 1.10* 1.11*  CALCIUM 8.9 8.8*   PT/INR No results for input(s): LABPROT, INR in the last 72 hours. Comprehensive Metabolic Panel:    Component Value Date/Time   NA 142 08-Feb-2015 0519   NA 143 01/21/2015 0536   K 3.8 02/08/15 0519   K 3.4* 01/21/2015 0536   CL 105 02/08/2015 0519   CL 108 01/21/2015 0536   CO2 29 02-08-2015 0519   CO2 25 01/21/2015 0536   BUN 10 February 08, 2015 0519   BUN 13 01/21/2015 0536   CREATININE 1.11* 02/08/15 0519   CREATININE 1.10* 01/21/2015 0536   GLUCOSE 128* February 08, 2015 0519   GLUCOSE 98 01/21/2015 0536   CALCIUM 8.8* Feb 08, 2015 0519   CALCIUM 8.9 01/21/2015 0536   AST 18 01/18/2015 2106   AST 21 08/17/2014 1714   ALT 12* 01/18/2015 2106   ALT 11 08/17/2014 1714   ALKPHOS 61 01/18/2015 2106   ALKPHOS 69 08/17/2014 1714   BILITOT 0.5 01/18/2015 2106   BILITOT 0.7 08/17/2014 1714   PROT 7.7 01/18/2015 2106   PROT 6.6 08/17/2014 1714   ALBUMIN 4.6 01/18/2015 2106   ALBUMIN 3.8 08/17/2014 1714    Studies/Results: Dg Abd 2 Views  02-08-2015   CLINICAL DATA:  Small bowel obstruction  EXAM: ABDOMEN - 2 VIEW  COMPARISON:  01/18/2015 CT and plain films  FINDINGS: Decreasing gaseous distention of small bowel loops. Gas and stool noted within colon currently. No free air organomegaly. No suspicious calcification.  IMPRESSION: Decreasing gaseous distension of small bowel. Small bowel loops currently not dilated.   Electronically Signed   By: Charlett Nose M.D.   On: 08-Feb-2015 09:25    Anti-infectives: Anti-infectives    None      Assessment & Plans: Small bowel obstruction, likely secondary to adhesions Clinically improving AXR this AM with no sign of obstruction  Will begin regular diet today Encouraged OOB, ambulation Will follow  Velora Heckler, MD, [facility] Office: [phone]   GERKIN,TODD Judie Petit 02/08/15

## 2015-01-23 DIAGNOSIS — G309 Alzheimer's disease, unspecified: Secondary | ICD-10-CM

## 2015-01-23 LAB — BASIC METABOLIC PANEL
Anion gap: 7 (ref 5–15)
BUN: 11 mg/dL (ref 6–20)
CHLORIDE: 106 mmol/L (ref 101–111)
CO2: 32 mmol/L (ref 22–32)
Calcium: 8.5 mg/dL — ABNORMAL LOW (ref 8.9–10.3)
Creatinine, Ser: 1.33 mg/dL — ABNORMAL HIGH (ref 0.44–1.00)
GFR, EST AFRICAN AMERICAN: 44 mL/min — AB (ref >60)
GFR, EST NON AFRICAN AMERICAN: 38 mL/min — AB (ref >60)
Glucose, Bld: 101 mg/dL — ABNORMAL HIGH (ref 65–99)
Potassium: 3.8 mmol/L (ref 3.5–5.1)
SODIUM: 145 mmol/L (ref 135–145)

## 2015-01-23 LAB — MAGNESIUM: Magnesium: 1.8 mg/dL (ref 1.7–2.4)

## 2015-01-23 MED ORDER — RISPERIDONE 1 MG PO TBDP
1.0000 mg | ORAL_TABLET | Freq: Every day | ORAL | Status: DC
  Filled 2015-01-23: qty 1

## 2015-01-23 MED ORDER — MIRTAZAPINE 30 MG PO TABS
30.0000 mg | ORAL_TABLET | Freq: Every day | ORAL | Status: DC
  Filled 2015-01-23: qty 1

## 2015-01-23 MED ORDER — RISPERIDONE 1 MG PO TBDP: 1.0000 mg | ORAL_TABLET | Freq: Every day | ORAL | Status: DC

## 2015-01-23 MED ORDER — LEVOTHYROXINE SODIUM 125 MCG PO TABS
125.0000 ug | ORAL_TABLET | Freq: Every day | ORAL | Status: DC
  Filled 2015-01-23: qty 1

## 2015-01-23 MED ORDER — RISPERIDONE 0.5 MG PO TBDP
0.5000 mg | ORAL_TABLET | Freq: Every day | ORAL | Status: DC
  Filled 2015-01-23: qty 1

## 2015-01-23 MED ORDER — DOCUSATE SODIUM 100 MG PO CAPS: 100.0000 mg | ORAL_CAPSULE | Freq: Two times a day (BID) | ORAL | Status: DC

## 2015-01-23 MED ORDER — DONEPEZIL HCL 10 MG PO TABS
10.0000 mg | ORAL_TABLET | Freq: Every day | ORAL | Status: DC
  Filled 2015-01-23: qty 1

## 2015-01-23 MED ORDER — SENNOSIDES-DOCUSATE SODIUM 8.6-50 MG PO TABS: 2.0000 | ORAL_TABLET | Freq: Every day | ORAL | Status: DC

## 2015-01-23 MED ORDER — RISPERIDONE 0.5 MG PO TBDP: 0.5000 mg | ORAL_TABLET | Freq: Two times a day (BID) | ORAL | Status: DC

## 2015-01-23 MED ORDER — RISPERIDONE 0.5 MG PO TBDP: 0.5000 mg | ORAL_TABLET | Freq: Every day | ORAL | Status: DC

## 2015-01-23 MED ORDER — LISINOPRIL 40 MG PO TABS: 40.0000 mg | ORAL_TABLET | Freq: Every day | ORAL | Status: DC

## 2015-01-23 MED ORDER — MEMANTINE HCL 10 MG PO TABS
10.0000 mg | ORAL_TABLET | Freq: Two times a day (BID) | ORAL | Status: DC
  Administered 2015-01-23: 10 mg via ORAL
  Filled 2015-01-23 (×2): qty 1

## 2015-01-23 MED ORDER — SIMETHICONE 80 MG PO CHEW
125.0000 mg | CHEWABLE_TABLET | Freq: Three times a day (TID) | ORAL | Status: DC | PRN
  Filled 2015-01-23: qty 2

## 2015-01-23 MED ORDER — LATANOPROST 0.005 % OP SOLN
1.0000 [drp] | Freq: Every day | OPHTHALMIC | Status: DC
  Filled 2015-01-23: qty 2.5

## 2015-01-23 MED ORDER — PANTOPRAZOLE SODIUM 40 MG PO TBEC
40.0000 mg | DELAYED_RELEASE_TABLET | Freq: Two times a day (BID) | ORAL | Status: DC
  Administered 2015-01-23: 40 mg via ORAL
  Filled 2015-01-23: qty 1

## 2015-01-23 MED ORDER — CARVEDILOL 3.125 MG PO TABS: 3.1250 mg | ORAL_TABLET | Freq: Two times a day (BID) | ORAL | Status: AC

## 2015-01-23 MED ORDER — BRIMONIDINE TARTRATE 0.15 % OP SOLN
1.0000 [drp] | Freq: Every day | OPHTHALMIC | Status: DC
  Filled 2015-01-23: qty 5

## 2015-01-23 NOTE — Care Management Important Message (Signed)
Important Message  Patient Details  Name: Larin Depaoli MRN: [id number] Date of Birth: Feb 26, 1941   Medicare Important Message Given:  Yes-second notification given    Haskell Flirt 01/23/2015, 12:39 PMImportant Message  Patient Details  Name: Amey Hossain MRN: [id number] Date of Birth: 01-21-1941   Medicare Important Message Given:  Yes-second notification given    Haskell Flirt 01/23/2015, 12:38 PM

## 2015-01-23 NOTE — Discharge Summary (Signed)
Physician Discharge Summary  Norma Neal ZOX:[id number] DOB: [birth year] DOA: 01/18/2015  PCP: Thane Edu, MD  Admit date: 01/18/2015 Discharge date: 01/23/2015  Time spent: 65 minutes  Recommendations for Outpatient Follow-up:  1. Follow-up with Thane Edu, MD in 1 week. On follow-up patient will need her blood pressure reassessed. Patient's lisinopril was increased to 40 mg daily. Coreg was added to patient's blood pressure regimen. 2. CT abdomen and pelvis which was done on admission question a right breast mass of 1.9 cm. Diagnostic mammography was ordered however unable to be done in the inpatient setting on need to be ordered as a outpatient study. Patient will need outpatient mammogram done for further evaluation.  Discharge Diagnoses:  Principal Problem:   SBO (small bowel obstruction) Active Problems:   Acute renal failure superimposed on stage 3 chronic kidney disease   Alzheimer's disease   Hypothyroidism   Abnormal finding on breast imaging   CKD (chronic kidney disease)   Secondary hypertension, unspecified   Discharge Condition: Stable and improved  Diet recommendation: Regular  Filed Weights   01/19/15 0157  Weight: 58.559 kg (129 lb 1.6 oz)    History of present illness:  Per Dr Norma Neal is a 74 y.o. female with pmh of abdominal surgeries who presented to the ED with c/o abd pain, N/V. Some loose BMs. Onset of pain was 1 day prior to admission, worsening, located in epigastric area. Onset of N/V the morning of admission.  Work up in ED shows SBO due likely to adhesions.   Hospital Course:  #1 small bowel obstruction Questionable etiology. Likely secondary to possible adhesions. Patient was admitted placed on bowel rest, IV fluids and supportive care. General surgical consultation was obtained and patient was seen in consultation by Dr. Maisie Fus. NG tube was placed. Patient was followed. Patient improved slowly during the  hospitalization. NG tube was subsequently removed. Patient had no further emesis. Patient started to pass flatus. Patient then subsequently started to have bowel movements. Patient's electrolytes were repleted and magnesium Above 2. Patient was subsequently started on clear liquids and diet advanced to a regular diet which she tolerated. Patient did have some abdominal pain when her diet however stated this was chronic. Patient did not have any emesis tolerating her diet well and was having bowel movements by day of discharge. Patient is to follow-up with PCP as outpatient.   #2 hypertension During the hospitalization patient was noted to have elevated blood pressures. Patient was placed on IV enalapril and IV Lopressor was subsequently added and uptitrated for better blood pressure control. Patient be discharged home on lisinopril 40 mg daily as well as Coreg 3.125 mg twice daily. Outpatient follow-up.   #3 acute on chronic kidney disease stage III Likely secondary to prerenal azotemia. Patient was placed on IV fluids with resolution of acute on chronic kidney disease stage III back to baseline. Outpatient follow-up.  #4 ?? Right breast mass seen on CT abdomen and pelvis Diagnostic mammogram as outpatient.  #5 hypothyroidism During the hospitalization patient was placed on IV Synthroid as she was nothing by mouth. Patient will be discharged back on a home regimen of Synthroid.   The rest of patient's chronic medical issues remained stable throughout the hospitalization patient be discharged in stable and improved condition.  Procedures:  CT abdomen and pelvis 01/18/2015  Acute abdominal series 01/18/2015, 01/22/2015  Consultations:  Gen. surgery: Dr. Maisie Fus 01/18/2015  Discharge Exam: Filed Vitals:   01/23/15 1359  BP: 155/71  Pulse: 73  Temp: 98.9 F (37.2 C)  Resp: 18    General: NAD Cardiovascular: RRR Respiratory: CTAB  Discharge Instructions   Discharge Instructions     Diet general    Complete by:  As directed      Discharge instructions    Complete by:  As directed   Follow up with Thane Edu, MD in 1 week.     Increase activity slowly    Complete by:  As directed           Current Discharge Medication List    START taking these medications   Details  carvedilol (COREG) 3.125 MG tablet Take 1 tablet (3.125 mg total) by mouth 2 (two) times daily with a meal. Qty: 60 tablet, Refills: 0    docusate sodium (COLACE) 100 MG capsule Take 1 capsule (100 mg total) by mouth 2 (two) times daily. Qty: 10 capsule, Refills: 0    !! risperiDONE (RISPERDAL M-TABS) 0.5 MG disintegrating tablet Take 1 tablet (0.5 mg total) by mouth at bedtime. Qty: 30 tablet, Refills: 0    !! risperiDONE (RISPERDAL M-TABS) 1 MG disintegrating tablet Take 1 tablet (1 mg total) by mouth daily. Qty: 30 tablet, Refills: 0     !! - Potential duplicate medications found. Please discuss with provider.    CONTINUE these medications which have CHANGED   Details  lisinopril (PRINIVIL,ZESTRIL) 40 MG tablet Take 1 tablet (40 mg total) by mouth daily. Qty: 30 tablet, Refills: 0      CONTINUE these medications which have NOT CHANGED   Details  albuterol (PROVENTIL HFA;VENTOLIN HFA) 108 (90 BASE) MCG/ACT inhaler Inhale 1-2 puffs into the lungs every 6 (six) hours as needed for wheezing or shortness of breath.    albuterol (PROVENTIL) (2.5 MG/3ML) 0.083% nebulizer solution Take 2.5 mg by nebulization every 6 (six) hours as needed for wheezing or shortness of breath.    aspirin 325 MG tablet Take 1 tablet (325 mg total) by mouth daily. Qty: 30 tablet, Refills: 0    atorvastatin (LIPITOR) 10 MG tablet Take 10 mg by mouth at bedtime.    brimonidine (ALPHAGAN) 0.15 % ophthalmic solution Place 1 drop into both eyes at bedtime.     cholecalciferol (VITAMIN D) 1000 UNITS tablet Take 1,000 Units by mouth daily.    donepezil (ARICEPT) 10 MG tablet Take 10 mg by mouth at  bedtime.    latanoprost (XALATAN) 0.005 % ophthalmic solution Place 1 drop into both eyes at bedtime.    levothyroxine (SYNTHROID, LEVOTHROID) 125 MCG tablet Take 125 mcg by mouth daily before breakfast.    memantine (NAMENDA) 10 MG tablet Take 10 mg by mouth 2 (two) times daily.    mirtazapine (REMERON) 30 MG tablet Take 30 mg by mouth at bedtime.    pantoprazole (PROTONIX) 40 MG tablet Take 1 tablet (40 mg total) by mouth 2 (two) times daily before a meal. Qty: 60 tablet, Refills: 11    polyethylene glycol (MIRALAX / GLYCOLAX) packet Take 17 g by mouth daily as needed for moderate constipation.    ranitidine (ZANTAC) 150 MG tablet Take 150 mg by mouth at bedtime.    sennosides-docusate sodium (SENOKOT-S) 8.6-50 MG tablet Take 2 tablets by mouth at bedtime.    simethicone (MYLICON) 125 MG chewable tablet Chew 125 mg by mouth 3 (three) times daily as needed for flatulence.      STOP taking these medications     escitalopram (LEXAPRO) 20 MG tablet        No  Known Allergies Follow-up Information    Follow up with Thane Edu, MD. Schedule an appointment as soon as possible for a visit in 1 week.   Specialty:  Family Medicine   Why:  f/u in 1-2 weeks.   Contact information:   [address] [place] [address] [phone]       Follow up with Thane Edu, MD In 1 week.   Specialty:  Family Medicine   Why:  Tuesday  01/24/2015 at 10: 00 am   Contact information:   [address] [place] [address] [phone]        The results of significant diagnostics from this hospitalization (including imaging, microbiology, ancillary and laboratory) are listed below for reference.    Significant Diagnostic Studies: Ct Abdomen Pelvis W Contrast  01/18/2015   CLINICAL DATA:  pt from [facility]. Pt lives at home. Co mid abd pain 10/10 per ems per pt. Also co nausea emesis. Pt received 25 mg IM phenergan and 5 mg Morphine PO  at the facility.  EXAM: CT ABDOMEN AND PELVIS WITH CONTRAST  TECHNIQUE: Multidetector CT imaging of the abdomen and pelvis was performed using the standard protocol following bolus administration of intravenous contrast.  CONTRAST:  OMNIPAQUE IOHEXOL 300 MG/ML  SOLN  COMPARISON:  None.  FINDINGS: Lower chest: There is mild scarring at the lung bases. Note is made of a 1.9 cm density within the central aspect of the right breast, only partially imaged on this exam. Heart size is normal. No imaged pericardial effusion or significant coronary artery calcifications.  Upper abdomen: No focal abnormality identified within the liver, spleen, pancreas, or adrenal glands. Cyst is identified in the lower pole of the left kidney. No hydronephrosis. Gallbladder is present.  Gastrointestinal tract: Hiatal hernia is present. The stomach is otherwise normal in appearance. This proximal small bowel loops are normal in caliber. There is significant dilatation of the mid small bowel loops. The proximal and distal small bowel loops appear normal caliber. There is a transition zone of dilated to normal caliber small bowel loops in the left mid abdomen. Suspect adhesions as a cause for obstruction. No mass is identified in this region. Numerous colonic diverticula. No associated inflammation.  Pelvis: Status post hysterectomy. Small amount of free pelvic fluid.  Retroperitoneum: Atherosclerotic calcification of the abdominal aorta. No aneurysm.  Abdominal wall: Postoperative changes in the anterior abdominal wall.  Osseous structures: Facet hypertrophy in the lower lumbar levels.  IMPRESSION: 1. High-grade small bowel obstruction best localized to the left mid abdomen. Adhesions are suspected as a cause for obstruction. Small amount of free pelvic fluid. No free air. 2. Colonic diverticulosis without acute diverticulitis. 3. Hiatal hernia. 4. Question of right breast mass measuring 1.9 cm. Further evaluation with diagnostic  mammogram and ultrasound at a dedicated facility is recommended when the patient is able. 5. Left lower pole renal cyst. 6. Status post hysterectomy. 7. Abdominal aortic atherosclerosis. 8. Degenerative disc disease   Electronically Signed   By: Norva Pavlov M.D.   On: 01/18/2015 23:13   Dg Abd 2 Views  01/22/2015   CLINICAL DATA:  Small bowel obstruction  EXAM: ABDOMEN - 2 VIEW  COMPARISON:  01/18/2015 CT and plain films  FINDINGS: Decreasing gaseous distention of small bowel loops. Gas and stool noted within colon currently. No free air organomegaly. No suspicious calcification.  IMPRESSION: Decreasing gaseous distension of small bowel. Small bowel loops currently not dilated.  Electronically Signed   By: Charlett Nose M.D.   On: 01/22/2015 09:25   Dg Abd Acute W/chest  01/18/2015   CLINICAL DATA:  Nausea, vomiting and lower abdominal pain today, history hypertension, diabetes mellitus, stage III chronic kidney disease, Alzheimer's, GERD  EXAM: DG ABDOMEN ACUTE W/ 1V CHEST  COMPARISON:  Abdominal radiograph 10/18/2013  FINDINGS: Normal heart size, mediastinal contours, and pulmonary vascularity.  Atherosclerotic calcification aorta.  Lungs clear.  No pleural effusion or pneumothorax.  Bones demineralized.  Air-filled loops of small bowel which are dilated and out of proportion to relatively decreased amount of colonic gas compatible with small bowel obstruction.  No free intraperitoneal air.  Mild degenerate disc and facet disease changes lumbar spine.  No urinary tract calcification.  IMPRESSION: Small bowel obstruction.   Electronically Signed   By: Ulyses Southward M.D.   On: 01/18/2015 19:38    Microbiology: No results found for this or any previous visit (from the past 240 hour(s)).   Labs: Basic Metabolic Panel:  Recent Labs Lab 01/19/15 0942 01/20/15 0515 01/21/15 0536 01/22/15 0519 01/23/15 0514  NA 146* 144 143 142 145  K 3.7 3.5 3.4* 3.8 3.8  CL 118* 116* 108 105 106  CO2 21* 17*  25 29 32  GLUCOSE 95 66 98 128* 101*  BUN 20 19 13 10 11   CREATININE 1.24* 1.18* 1.10* 1.11* 1.33*  CALCIUM 8.8* 8.7* 8.9 8.8* 8.5*  MG 2.1 2.1 1.9 1.8 1.8   Liver Function Tests:  Recent Labs Lab 01/18/15 2106  AST 18  ALT 12*  ALKPHOS 61  BILITOT 0.5  PROT 7.7  ALBUMIN 4.6    Recent Labs Lab 01/18/15 2106  LIPASE 14*   No results for input(s): AMMONIA in the last 168 hours. CBC:  Recent Labs Lab 01/18/15 2106 01/19/15 0942 01/20/15 0515 01/22/15 0519  WBC 9.5 6.2 5.7 5.7  NEUTROABS 8.9*  --   --   --   HGB 11.4* 9.7* 9.7* 10.0*  HCT 34.7* 29.3* 29.3* 30.3*  MCV 88.5 89.3 89.3 87.8  PLT 187 168 168 178   Cardiac Enzymes: No results for input(s): CKTOTAL, CKMB, CKMBINDEX, TROPONINI in the last 168 hours. BNP: BNP (last 3 results) No results for input(s): BNP in the last 8760 hours.  ProBNP (last 3 results) No results for input(s): PROBNP in the last 8760 hours.  CBG: No results for input(s): GLUCAP in the last 168 hours.     SignedRamiro Harvest MD [facility] 01/23/2015, 3:40 PM

## 2015-01-23 NOTE — Progress Notes (Signed)
Subjective: Pt having BM and tolerating diet.  She says it hurts some this AM but looks good, no tenderness on exam.  Objective: Vital signs in last 24 hours: Temp:  [98.2 F (36.8 C)-98.9 F (37.2 C)] 98.2 F (36.8 C) (09/26 0530) Pulse Rate:  [76-95] 76 (09/26 0530) Resp:  [16-18] 16 (09/26 0530) BP: (145-190)/(69-89) 190/83 mmHg (09/26 0530) SpO2:  [95 %-98 %] 98 % (09/26 0530) Last BM Date: 01-30-15 600 urine recorded Stools recorded yesterday and this Am Regular diet Afebrile, VSS (BP up some) Creatinine is up some, K+ is 3.8 Intake/Output from previous day: 01-30-23 0701 - 09/26 0700 In: 1166.7 [I.V.:766.7; IV Piggyback:400] Out: 600 [Urine:600] Intake/Output this shift: Total I/O In: 120 [P.O.:120] Out: -   General appearance: alert, cooperative and no distress GI: soft, non-tender; bowel sounds normal; no masses,  no organomegaly  Lab Results:   Recent Labs  01/30/2015 0519  WBC 5.7  HGB 10.0*  HCT 30.3*  PLT 178    BMET  Recent Labs  January 30, 2015 0519 01/23/15 0514  NA 142 145  K 3.8 3.8  CL 105 106  CO2 29 32  GLUCOSE 128* 101*  BUN 10 11  CREATININE 1.11* 1.33*  CALCIUM 8.8* 8.5*   PT/INR No results for input(s): LABPROT, INR in the last 72 hours.   Recent Labs Lab 01/18/15 2106  AST 18  ALT 12*  ALKPHOS 61  BILITOT 0.5  PROT 7.7  ALBUMIN 4.6     Lipase     Component Value Date/Time   LIPASE 14* 01/18/2015 2106     Studies/Results: Dg Abd 2 Views  2015-01-30   CLINICAL DATA:  Small bowel obstruction  EXAM: ABDOMEN - 2 VIEW  COMPARISON:  01/18/2015 CT and plain films  FINDINGS: Decreasing gaseous distention of small bowel loops. Gas and stool noted within colon currently. No free air organomegaly. No suspicious calcification.  IMPRESSION: Decreasing gaseous distension of small bowel. Small bowel loops currently not dilated.   Electronically Signed   By: Charlett Nose M.D.   On: 2015-01-30 09:25    Medications: . antiseptic oral  rinse  7 mL Mouth Rinse q12n4p  . chlorhexidine  15 mL Mouth Rinse BID  . docusate sodium  100 mg Oral BID  . enalaprilat  1.25 mg Intravenous Q6H  . escitalopram  20 mg Oral Daily  . heparin  5,000 Units Subcutaneous 3 times per day  . levothyroxine  12.5 mcg Intravenous Daily  . metoprolol  10 mg Intravenous 3 times per day  . mirtazapine  15 mg Oral QHS  . pantoprazole (PROTONIX) IV  40 mg Intravenous Q12H  . risperiDONE  0.5 mg Oral QHS  . risperiDONE  1 mg Oral Daily    Prior to Admission medications   Medication Sig Start Date End Date Taking? Authorizing Provider  albuterol (PROVENTIL HFA;VENTOLIN HFA) 108 (90 BASE) MCG/ACT inhaler Inhale 1-2 puffs into the lungs every 6 (six) hours as needed for wheezing or shortness of breath.   Yes Historical Provider, MD  albuterol (PROVENTIL) (2.5 MG/3ML) 0.083% nebulizer solution Take 2.5 mg by nebulization every 6 (six) hours as needed for wheezing or shortness of breath.   Yes Historical Provider, MD  aspirin 325 MG tablet Take 1 tablet (325 mg total) by mouth daily. 08/19/14  Yes Lorenda Hatchet, MD  atorvastatin (LIPITOR) 10 MG tablet Take 10 mg by mouth at bedtime.   Yes Historical Provider, MD  brimonidine (ALPHAGAN) 0.15 % ophthalmic solution Place  1 drop into both eyes at bedtime.    Yes Historical Provider, MD  cholecalciferol (VITAMIN D) 1000 UNITS tablet Take 1,000 Units by mouth daily.   Yes Historical Provider, MD  donepezil (ARICEPT) 10 MG tablet Take 10 mg by mouth at bedtime.   Yes Historical Provider, MD  latanoprost (XALATAN) 0.005 % ophthalmic solution Place 1 drop into both eyes at bedtime.   Yes Historical Provider, MD  levothyroxine (SYNTHROID, LEVOTHROID) 125 MCG tablet Take 125 mcg by mouth daily before breakfast.   Yes Historical Provider, MD  lisinopril (PRINIVIL,ZESTRIL) 10 MG tablet Take 2 tablets (20 mg total) by mouth daily. Patient taking differently: Take 10 mg by mouth daily.  08/21/14  Yes Lorenda Hatchet, MD   memantine (NAMENDA) 10 MG tablet Take 10 mg by mouth 2 (two) times daily.   Yes Historical Provider, MD  mirtazapine (REMERON) 30 MG tablet Take 30 mg by mouth at bedtime.   Yes Historical Provider, MD  pantoprazole (PROTONIX) 40 MG tablet Take 1 tablet (40 mg total) by mouth 2 (two) times daily before a meal. 03/02/14  Yes Rachael Fee, MD  polyethylene glycol [facility] / GLYCOLAX) packet Take 17 g by mouth daily as needed for moderate constipation.   Yes Historical Provider, MD  ranitidine (ZANTAC) 150 MG tablet Take 150 mg by mouth at bedtime.   Yes Historical Provider, MD  sennosides-docusate sodium (SENOKOT-S) 8.6-50 MG tablet Take 2 tablets by mouth at bedtime.   Yes Historical Provider, MD  simethicone (MYLICON) 125 MG chewable tablet Chew 125 mg by mouth 3 (three) times daily as needed for flatulence.   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  Please call if we can help.     LOS: 4 days    JENNINGS,WILLARD 01/23/2015

## 2015-01-27 ENCOUNTER — Other Ambulatory Visit: Payer: Self-pay | Admitting: Family Medicine

## 2015-01-27 DIAGNOSIS — R229 Localized swelling, mass and lump, unspecified: Principal | ICD-10-CM

## 2015-01-27 DIAGNOSIS — IMO0002 Reserved for concepts with insufficient information to code with codable children: Secondary | ICD-10-CM

## 2015-02-02 ENCOUNTER — Ambulatory Visit
Admission: RE | Admit: 2015-02-02 | Discharge: 2015-02-02 | Disposition: A | Discharge status: Home / Self Care | Payer: Medicare (Managed Care) | Source: Ambulatory Visit | Attending: Family Medicine | Admitting: Family Medicine

## 2015-02-02 DIAGNOSIS — IMO0002 Reserved for concepts with insufficient information to code with codable children: Secondary | ICD-10-CM

## 2015-02-02 DIAGNOSIS — R229 Localized swelling, mass and lump, unspecified: Principal | ICD-10-CM

## 2015-02-22 ENCOUNTER — Other Ambulatory Visit: Payer: Self-pay | Admitting: Family Medicine

## 2015-02-22 DIAGNOSIS — N6310 Unspecified lump in the right breast, unspecified quadrant: Secondary | ICD-10-CM

## 2015-08-07 ENCOUNTER — Ambulatory Visit
Admission: RE | Admit: 2015-08-07 | Discharge: 2015-08-07 | Disposition: A | Discharge status: Home / Self Care | Payer: Medicare (Managed Care) | Source: Ambulatory Visit | Attending: Family Medicine | Admitting: Family Medicine

## 2015-08-07 ENCOUNTER — Ambulatory Visit
Admission: RE | Admit: 2015-08-07 | Discharge: 2015-08-07 | Disposition: A | Discharge status: Home / Self Care | Payer: No Typology Code available for payment source | Source: Ambulatory Visit | Attending: Family Medicine | Admitting: Family Medicine

## 2015-08-07 DIAGNOSIS — N6310 Unspecified lump in the right breast, unspecified quadrant: Secondary | ICD-10-CM

## 2015-11-22 ENCOUNTER — Other Ambulatory Visit: Payer: Self-pay | Admitting: Family Medicine

## 2015-11-22 DIAGNOSIS — N6310 Unspecified lump in the right breast, unspecified quadrant: Secondary | ICD-10-CM

## 2016-02-07 ENCOUNTER — Other Ambulatory Visit: Payer: Self-pay | Admitting: Family Medicine

## 2016-02-07 ENCOUNTER — Ambulatory Visit
Admission: RE | Admit: 2016-02-07 | Discharge: 2016-02-07 | Disposition: A | Discharge status: Home / Self Care | Payer: Medicare (Managed Care) | Source: Ambulatory Visit | Attending: Family Medicine | Admitting: Family Medicine

## 2016-02-07 DIAGNOSIS — N6310 Unspecified lump in the right breast, unspecified quadrant: Secondary | ICD-10-CM

## 2016-02-16 ENCOUNTER — Ambulatory Visit
Admission: RE | Admit: 2016-02-16 | Discharge: 2016-02-16 | Disposition: A | Discharge status: Home / Self Care | Payer: Medicare (Managed Care) | Source: Ambulatory Visit | Attending: Family Medicine | Admitting: Family Medicine

## 2016-02-16 DIAGNOSIS — N6310 Unspecified lump in the right breast, unspecified quadrant: Secondary | ICD-10-CM

## 2016-04-17 ENCOUNTER — Encounter: Payer: Self-pay | Admitting: Physician Assistant

## 2016-04-17 ENCOUNTER — Ambulatory Visit (INDEPENDENT_AMBULATORY_CARE_PROVIDER_SITE_OTHER): Payer: Medicare (Managed Care) | Admitting: Physician Assistant

## 2016-04-17 VITALS — BP 104/58 | HR 68 | Ht 63.0 in | Wt 135.0 lb

## 2016-04-17 DIAGNOSIS — R12 Heartburn: Secondary | ICD-10-CM | POA: Diagnosis not present

## 2016-04-17 DIAGNOSIS — R1310 Dysphagia, unspecified: Secondary | ICD-10-CM

## 2016-04-17 DIAGNOSIS — K219 Gastro-esophageal reflux disease without esophagitis: Secondary | ICD-10-CM | POA: Diagnosis not present

## 2016-04-17 MED ORDER — ESOMEPRAZOLE MAGNESIUM 40 MG PO CPDR: 40.0000 mg | DELAYED_RELEASE_CAPSULE | Freq: Two times a day (BID) | ORAL | 3 refills | Status: DC

## 2016-04-17 MED ORDER — ESOMEPRAZOLE MAGNESIUM 40 MG PO CPDR: 40.0000 mg | DELAYED_RELEASE_CAPSULE | Freq: Two times a day (BID) | ORAL | 3 refills | Status: AC

## 2016-04-17 MED ORDER — SUCRALFATE 1 GM/10ML PO SUSP: 1.0000 g | Freq: Three times a day (TID) | ORAL | 1 refills | Status: DC

## 2016-04-17 NOTE — Progress Notes (Signed)
I agree with the above note, plan 

## 2016-04-17 NOTE — Progress Notes (Signed)
Subjective:    Patient ID: Norma Neal, female    DOB: 08/25/1940, 75 y.o.   MRN: [id number]  HPI Norma Neal is a pleasant 75 year old African-American female known to Dr. Christella HartiganJacobs who is referred today by Norma Neal of  the Norma Neal for complaints of GERD and intermittent dysphagia. She was last seen here in 2015 and had undergone endoscopy at that time which showed a 4 cm hiatal hernia no stricture and otherwise negative exam. Patient had CT of the abdomen and pelvis in 2016 at which time she was admitted with a small bowel obstruction felt secondary to adhesions. She was noted to have diverticulosis at that time and is status post hysterectomy. History also pertinent for Alzheimer's dementia, history of TIA, hypothyroidism chronic kidney disease and hypertension. Patients medical record shows that she is on Protonix 40 mg twice daily and Zantac 150 at bedtime, she is not aware of  what medications she takes, but says that her medicines are prepackaged for her and she takes all of them. Says despite these medicines she is having lots of burning in her chest and seems to be worse after eating Norma Neal sometimes alleviated by Tums. She says her appetite is fair she denies any nausea or vomiting. She has some chronic abdominal discomfort. Bowel movements have been fairly regular no melena or hematochezia. She says that she does have intermittent difficulty with her food "sticking" she says this is painful until the food goes on down. She's not taking any regular NSAIDs does take a 325 mg aspirin daily.  Review of Systems; Pertinent positive and negative review of systems were noted in the above HPI section.  All other review of systems was otherwise negative.  Outpatient Encounter Prescriptions as of 04/17/2016  Medication Sig  . albuterol (PROVENTIL HFA;VENTOLIN HFA) 108 (90 BASE) MCG/ACT inhaler Inhale 1-2 puffs into the lungs every 6 (six) hours as needed for wheezing or shortness of breath.  Marland Kitchen.  albuterol (PROVENTIL) (2.5 MG/3ML) 0.083% nebulizer solution Take 2.5 mg by nebulization every 6 (six) hours as needed for wheezing or shortness of breath.  Marland Kitchen. aspirin 325 MG tablet Take 1 tablet (325 mg total) by mouth daily.  Marland Kitchen. atorvastatin (LIPITOR) 10 MG tablet Take 10 mg by mouth at bedtime.  . brimonidine (ALPHAGAN) 0.15 % ophthalmic solution Place 1 drop into both eyes at bedtime.   . carvedilol (COREG) 3.125 MG tablet Take 1 tablet (3.125 mg total) by mouth 2 (two) times daily with a meal.  . cholecalciferol (VITAMIN D) 1000 UNITS tablet Take 1,000 Units by mouth daily.  Marland Kitchen. docusate sodium (COLACE) 100 MG capsule Take 1 capsule (100 mg total) by mouth 2 (two) times daily.  Marland Kitchen. donepezil (ARICEPT) 10 MG tablet Take 10 mg by mouth at bedtime.  Marland Kitchen. latanoprost (XALATAN) 0.005 % ophthalmic solution Place 1 drop into both eyes at bedtime.  Marland Kitchen. levothyroxine (SYNTHROID, LEVOTHROID) 125 MCG tablet Take 125 mcg by mouth daily before breakfast.  . lisinopril (PRINIVIL,ZESTRIL) 40 MG tablet Take 1 tablet (40 mg total) by mouth daily.  . memantine (NAMENDA) 10 MG tablet Take 10 mg by mouth 2 (two) times daily.  . mirtazapine (REMERON) 30 MG tablet Take 30 mg by mouth at bedtime.  . pantoprazole (PROTONIX) 40 MG tablet Take 1 tablet (40 mg total) by mouth 2 (two) times daily before a meal.  . polyethylene glycol (MIRALAX / GLYCOLAX) packet Take 17 g by mouth daily as needed for moderate constipation.  . ranitidine (ZANTAC) 150 MG tablet  Take 150 mg by mouth at bedtime.  . risperiDONE (RISPERDAL M-TABS) 0.5 MG disintegrating tablet Take 1 tablet (0.5 mg total) by mouth at bedtime.  . risperiDONE (RISPERDAL M-TABS) 1 MG disintegrating tablet Take 1 tablet (1 mg total) by mouth daily.  . sennosides-docusate sodium (SENOKOT-S) 8.6-50 MG tablet Take 2 tablets by mouth at bedtime.  . simethicone (MYLICON) 125 MG chewable tablet Chew 125 mg by mouth 3 (three) times daily as needed for flatulence.  Marland Kitchen. esomeprazole  (NEXIUM) 40 MG capsule Take 1 capsule (40 mg total) by mouth 2 (two) times daily before a meal.  . sucralfate (CARAFATE) 1 GM/10ML suspension Take 10 mLs (1 g total) by mouth 4 (four) times daily -  with meals and at bedtime.  . [DISCONTINUED] esomeprazole (NEXIUM) 40 MG capsule Take 1 capsule (40 mg total) by mouth 2 (two) times daily before a meal.  . [DISCONTINUED] esomeprazole (NEXIUM) 40 MG capsule Take 1 capsule (40 mg total) by mouth 2 (two) times daily before a meal.  . [DISCONTINUED] esomeprazole (NEXIUM) 40 MG capsule Take 1 capsule (40 mg total) by mouth 2 (two) times daily before a meal.   No facility-administered encounter medications on file as of 04/17/2016.    No Known Allergies Patient Active Problem List   Diagnosis Date Noted  . SBO (small bowel obstruction) 01/19/2015  . Abnormal finding on breast imaging 01/19/2015  . CKD (chronic kidney disease)   . Secondary hypertension, unspecified   . Alzheimer's disease 08/18/2014  . Hypothyroidism 08/18/2014  . Atypical chest pain 08/18/2014  . Ataxia   . Other specified transient cerebral ischemias   . CKD (chronic kidney disease) stage 3, GFR 30-59 ml/min   . TIA (transient ischemic attack) 08/17/2014  . Acute renal failure superimposed on stage 3 chronic kidney disease (HCC) 08/17/2014  . Anemia of chronic disease 08/17/2014  . Elevated CK 08/17/2014   Social History   Social History  . Marital status: Widowed    Spouse name: N/A  . Number of children: N/A  . Years of education: N/A   Occupational History  . Not on file.   Social History Main Topics  . Smoking status: Never Smoker  . Smokeless tobacco: Never Used  . Alcohol use No  . Drug use: No  . Sexual activity: No   Other Topics Concern  . Not on file   Social History Narrative  . No narrative on file    Norma Neal's family history includes Breast cancer in her cousin and sister; Cancer in her brother and sister; Heart attack in her sister.        Objective:    Vitals:   04/17/16 0912  BP: (!) 104/58  Pulse: 68    Physical Exam  well-developed elderly African-American female in no acute distress, pleasant blood pressure 104/58 pulse 68, BMI 23.9. HEENT; nontraumatic normocephalic EOMI PERRLA sclera anicteric, Cardiovascular r;egular rate and rhythm with S1-S2 no murmur or gallop, Pulmonary ;clear bilaterally, Abdomen ;soft nontender nondistended bowel sounds are active there is no palpable mass or hepatosplenomegaly rectal exam not done, Extremities ;no clubbing cyanosis or edema skin warm and dry, Neuropsych ;mood and affect appropriate she is conversant but a poor historian, answers questions in very short form frequently saying "sometimes"       Assessment & Plan:   #551 75 year old African-American female with Alzheimer's dementia with complaints of heartburn indigestion and intermittent solid food dysphagia despite Protonix 40 mg twice a day and Zantac at bedtime. I  am assuming she is receiving these medications as they are on her list the patient cannot tell me what she is actually taking. We'll need to rule out refractory GERD, esophagitis and possible peptic stricture #2 history of small bowel obstruction felt secondary to adhesions 2016 #3 diverticulosis next number  #4 history of TIA #5 chronic kidney disease #6 hypertension  Plan; Will schedule for barium swallow with a tablet. If barium swallow shows development of esophageal stricture she may benefit from EGD with dilation Stop Protonix Start Nexium 40 mg by mouth twice a day Continue ranitidine 150 mg by mouth daily at bedtime Start Carafate 1 g slowly between meals and at bedtime 1 month   Marisal Swarey S Emad Brechtel PA-C 04/17/2016   Cc: Jethro Bastos, MD

## 2016-04-17 NOTE — Patient Instructions (Signed)
Stop Protonix ( Pantoprazole sodium. )  We have printed for  You prescriptions .  1. Nexium 40 mg 2. Carafate liquid  You have been scheduled for a [facility] Esophogram [facility] (1st floor of the hospital) on Thursday 12-28 at 10:30 am. Please arrive AT 10:15 AM r to your appointment for registration. Make certain not to have anything to eat or drink 3 hours prior to your test. If you need to reschedule for any reason, please contact radiology at [phone] to do so. __________________________________________________________________ A barium swallow is an examination that concentrates on views of the esophagus. This tends to be a double contrast exam (barium and two liquids which, when combined, create a gas to distend the wall of the oesophagus) or single contrast (non-ionic iodine based). The study is usually tailored to your symptoms so a good history is essential. Attention is paid during the study to the form, structure and configuration of the esophagus, looking for functional disorders (such as aspiration, dysphagia, achalasia, motility and reflux) EXAMINATION You may be asked to change into a gown, depending on the type of swallow being performed. A radiologist and radiographer will perform the procedure. The radiologist will advise you of the type of contrast selected for your procedure and direct you during the exam. You will be asked to stand, sit or lie in several different positions and to hold a small amount of fluid in your mouth before being asked to swallow while the imaging is performed .In some instances you may be asked to swallow barium coated marshmallows to assess the motility of a solid food bolus. The exam can be recorded as a digital or video fluoroscopy procedure. POST PROCEDURE It will take 1-2 days for the barium to pass through your system. To facilitate this, it is important, unless otherwise directed, to increase your fluids for the next 24-48hrs and to  resume your normal diet.  This test typically takes about 30 minutes to perform. __________________________________________________________________________________

## 2016-04-25 ENCOUNTER — Ambulatory Visit (HOSPITAL_COMMUNITY)
Admission: RE | Admit: 2016-04-25 | Discharge: 2016-04-25 | Disposition: A | Discharge status: Home / Self Care | Payer: Medicare (Managed Care) | Source: Ambulatory Visit | Attending: Physician Assistant | Admitting: Physician Assistant

## 2016-04-25 ENCOUNTER — Ambulatory Visit (HOSPITAL_COMMUNITY): Payer: Medicare (Managed Care)

## 2016-04-25 DIAGNOSIS — R12 Heartburn: Secondary | ICD-10-CM | POA: Insufficient documentation

## 2016-04-25 DIAGNOSIS — K224 Dyskinesia of esophagus: Secondary | ICD-10-CM | POA: Insufficient documentation

## 2016-04-25 DIAGNOSIS — R1310 Dysphagia, unspecified: Secondary | ICD-10-CM | POA: Diagnosis not present

## 2016-04-25 DIAGNOSIS — K219 Gastro-esophageal reflux disease without esophagitis: Secondary | ICD-10-CM | POA: Diagnosis not present

## 2016-05-13 IMAGING — MR MR HEAD W/O CM
9 of 12 series · 27 of 48 positions shown · non-contrast
Comparison: CT of the head August 17, 2014 at 1413 hours and MRI of
the brain May 09, 2014

CLINICAL DATA: Sudden onset difficulty ambulating, gait instability
today at doctor's office. Similar episode last week. History of
hypertension, diabetes and neuropathy, chronic kidney disease,
Alzheimer's, depression, tardive dyskinesia.

EXAM:
MRI HEAD WITHOUT CONTRAST
MRA HEAD WITHOUT CONTRAST
TECHNIQUE: Multiplanar, multiecho pulse sequences of the brain and surrounding
structures were obtained without intravenous contrast. Angiographic
images of the head were obtained using MRA technique without
contrast.

[Series 2: FLAIR · sagittal · 5.0mm · 0.47mm/px · 1 of 21 slices shown (1 of 2)]
[im 1/21]
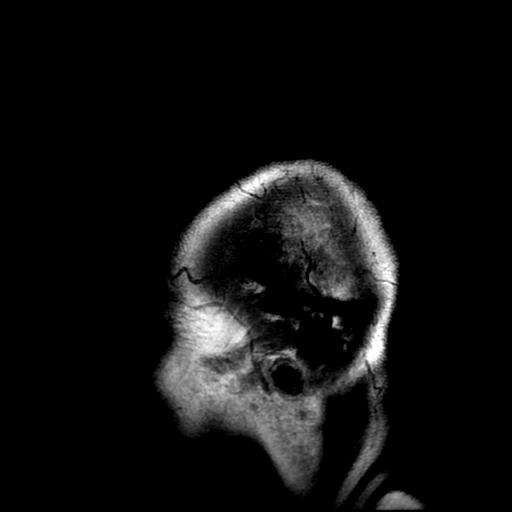

[Series 4: DWI · axial · 3.0mm · 0.94mm/px · z∈[-98,+39]mm · 6 of 94 slices shown (1 of 4)]
[im 1/94]
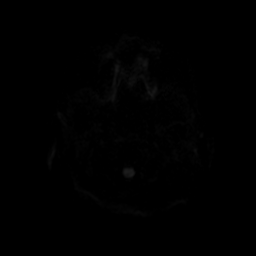
[im 19/94]
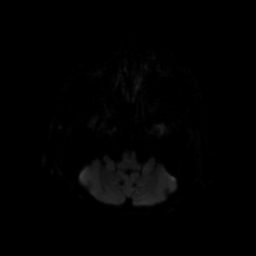
[im 38/94]
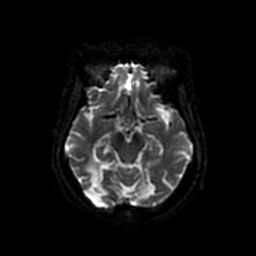
[im 56/94]
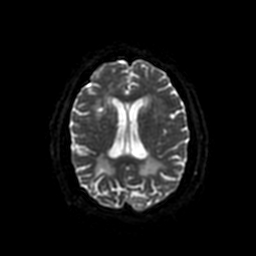
[im 75/94]
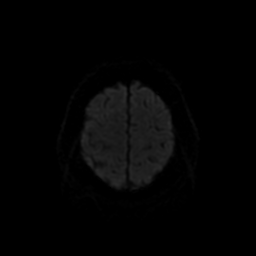
[im 94/94]
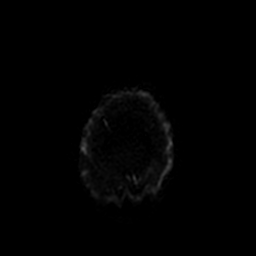

[Series 5: ax (id) 2 · axial · 1.2mm · 0.43mm/px · z∈[-101,-37]mm · 5 of 200 slices shown]
[im 1/200]
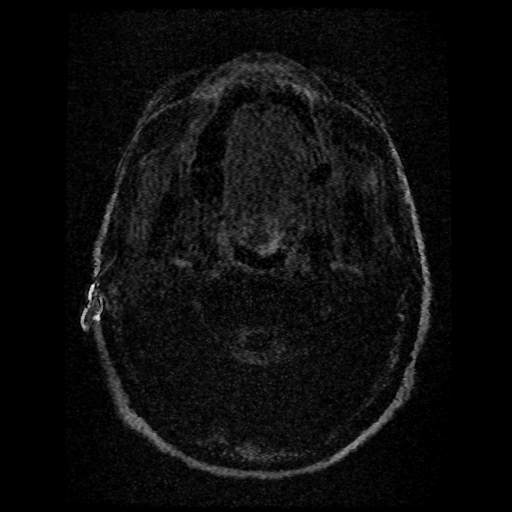
[im 31/200]
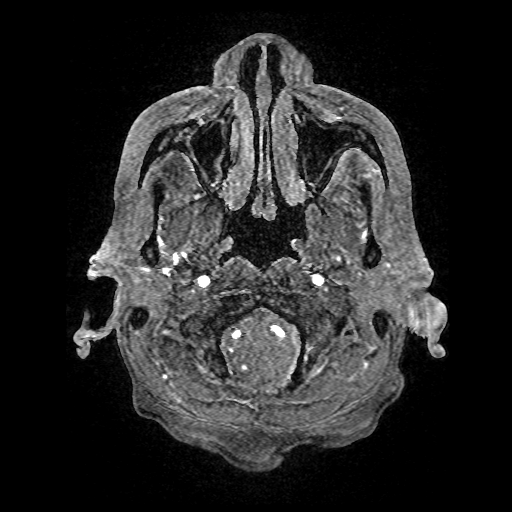
[im 62/200]
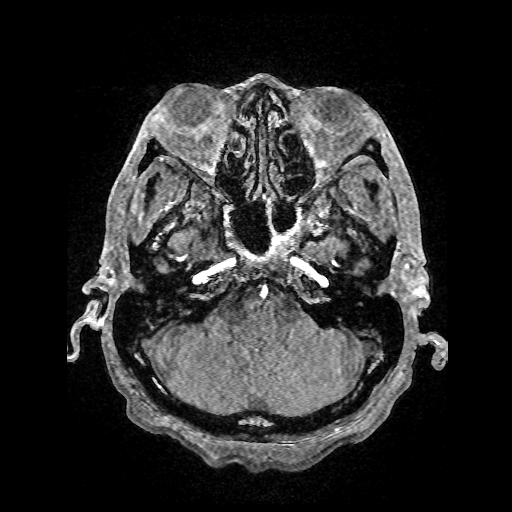
[im 92/200]
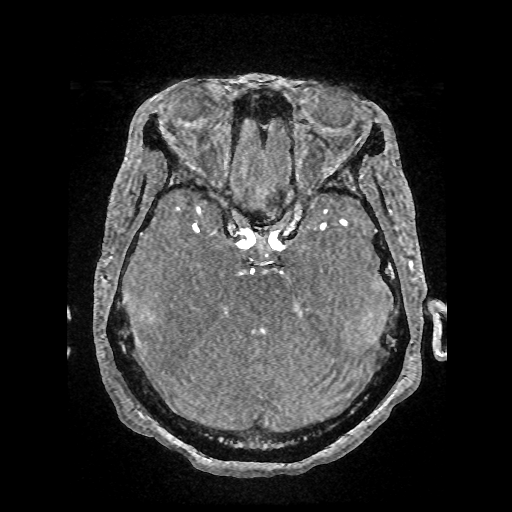
[im 108/200]
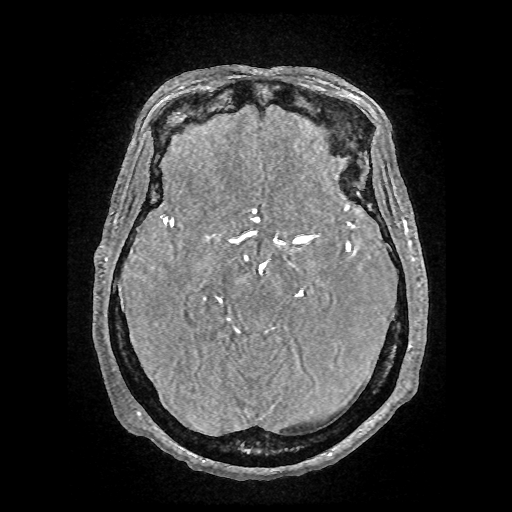

[Series 6: T2 · axial · 5.0mm · 0.47mm/px · z∈[-98,+39]mm · 2 of 24 slices shown (1 of 2)]
[im 1/24]
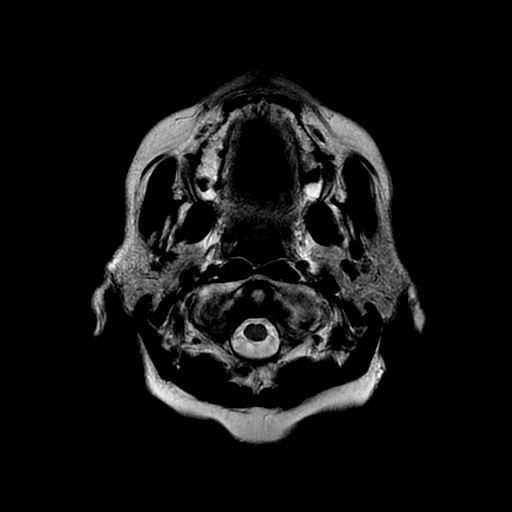
[im 24/24]
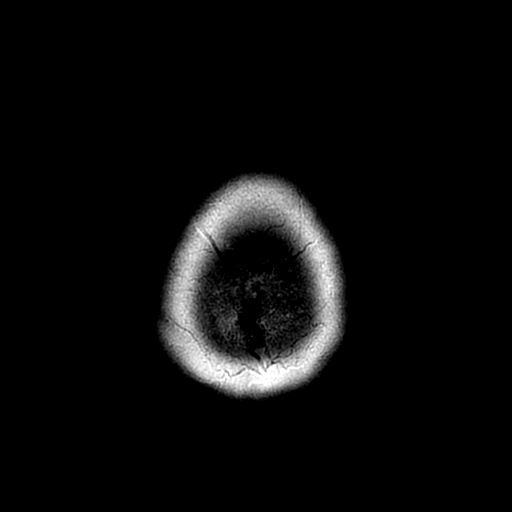

[Series 7: FLAIR · axial · 5.0mm · 0.47mm/px · z∈[-98,+39]mm · 2 of 24 slices shown (2 of 2)]
[im 1/24]
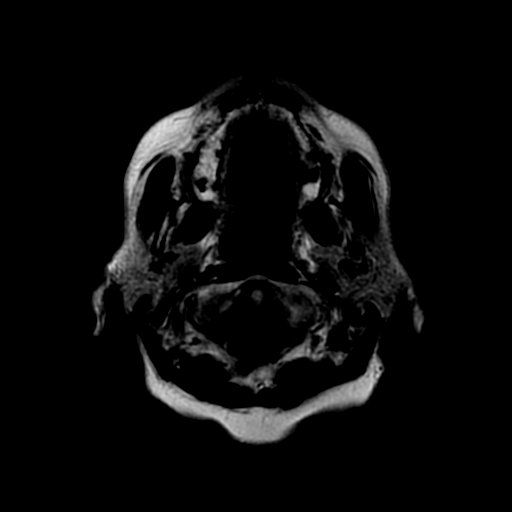
[im 24/24]
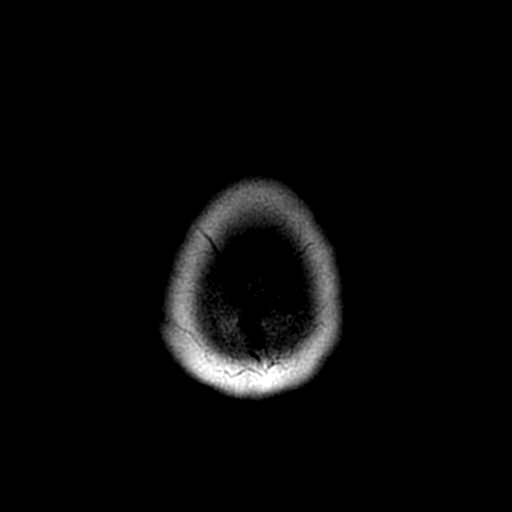

[Series 8: DWI · coronal · 5.0mm · 0.94mm/px · 4 of 58 slices shown (2 of 4)]
[im 1/58]
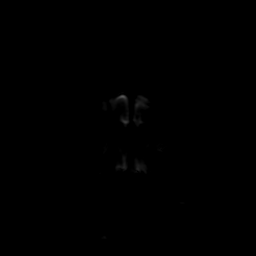
[im 20/58]
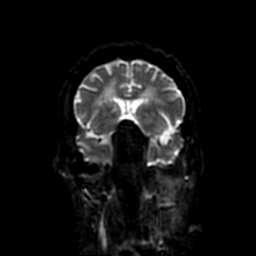
[im 39/58]
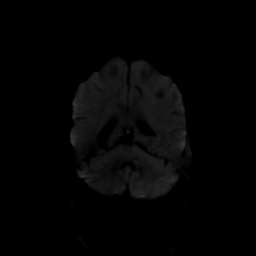
[im 58/58]
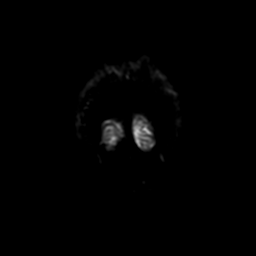

[Series 11: T2 · coronal · 5.0mm · 0.47mm/px · 2 of 25 slices shown (2 of 2)]
[im 1/25]
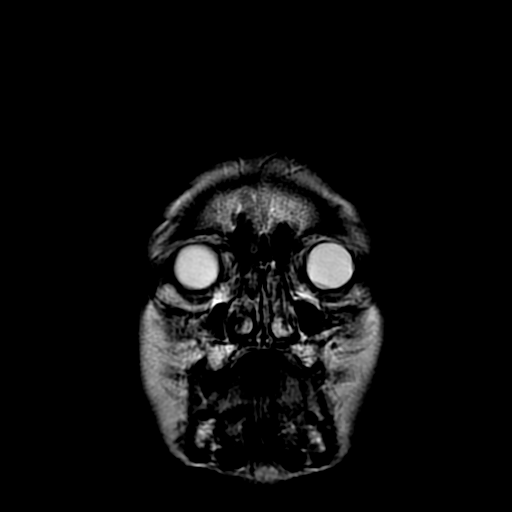
[im 25/25]
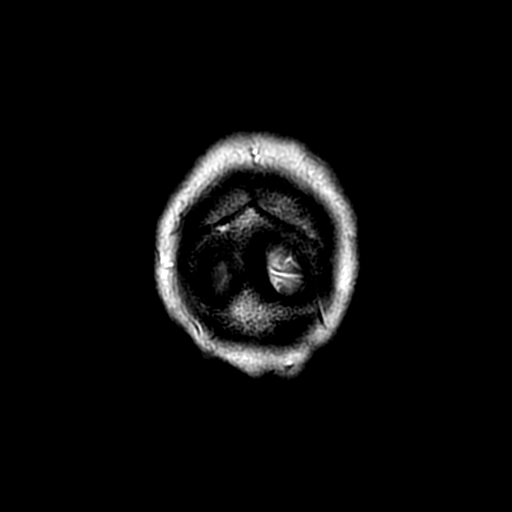

[Series 400: DWI · axial · 3.0mm · 0.94mm/px · z∈[-98,+39]mm · 3 of 47 slices shown (3 of 4)]
[im 1/47]
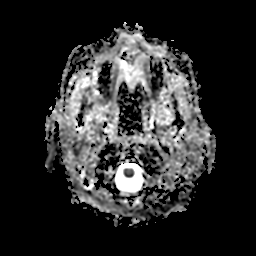
[im 24/47]
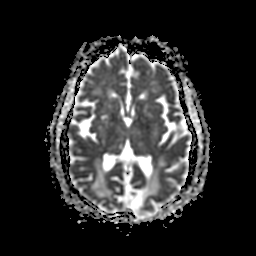
[im 47/47]
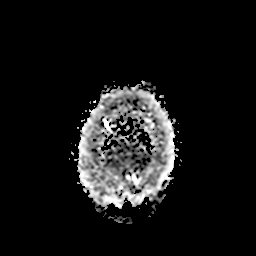

[Series 800: DWI · coronal · 5.0mm · 0.94mm/px · 2 of 29 slices shown (4 of 4)]
[im 1/29]
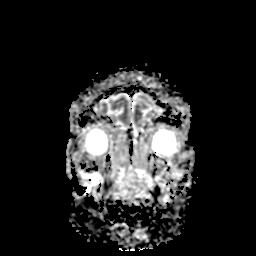
[im 29/29]
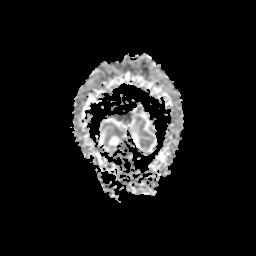

[27 of 48 positions shown; findings below may reference images not displayed]

FINDINGS: MRI HEAD FINDINGS

No reduced diffusion to suggest acute ischemia. No susceptibility
artifact to suggest hemorrhage. Stable mineralization RIGHT frontal
lobe, a few scattered peripherally distributed foci of
susceptibility artifact and, susceptibility artifact associated with
known LEFT parafalcine vertex probable meningioma. Mild ex vacuo
dilatation of LEFT greater than RIGHT occipital horns associated
with encephalomalacia and gliosis. Confluence supratentorial and to
lesser extent pontine white matter FLAIR T2 hyperintensities again
noted. Small area of LEFT frontal and parietal convexity
encephalomalacia. Tiny T2 hyperintensities seen in the basal ganglia
and RIGHT thalamus were present previously.

No abnormal extra-axial fluid collections. Trace bilateral mastoid
effusions. Mild paranasal sinus mucosal thickening without air-fluid
levels. Status post bilateral ocular lens implants. Mild sellar
expansion with superimposed subcentimeter soft tissue mass along the
anterior sella/clinoid possibly arising from the planum sphenoidale.

MRA HEAD FINDINGS

Anterior circulation: Normal flow related enhancement of the
included cervical, petrous, cavernous and supra clinoid internal
carotid arteries. Patent anterior communicating artery. Normal flow
related enhancement of the anterior and middle cerebral arteries,
including more distal segments. Mild stenosis mid RIGHT M2 segment.
Mid high-grade stenosis of distal LEFT M3 segment.

No large vessel occlusion, high-grade stenosis, abnormal luminal
irregularity, aneurysm.

Posterior circulation: Codominant vertebral arteries. Normal flow
related enhancement of the vertebral arteries. Short-segment
moderate stenosis of the proximal basilar artery, with mild
poststenotic dilatation. Moderate luminal irregularity basilar
artery likely reflects atherosclerosis. Tiny LEFT posterior
communicating artery is present. Bilateral posterior cerebral
arteries are patent. At least mild multifocal narrowing of LEFT P2
segment.

No large vessel occlusion, suspicious luminal irregularity,
aneurysm.
IMPRESSION: MRI HEAD: No acute intracranial process, specifically no acute
ischemia.

Remote bilateral posterior cerebral artery territory infarcts. LEFT
frontoparietal encephalomalacia suggest remote LEFT middle cerebral
artery territory infarct.

Moderate to severe chronic small vessel ischemic disease. Scattered
micro hemorrhages likely reflect sequelae of chronic hypertension.
Remote bilateral basal ganglia and RIGHT thalamus lacunar infarcts
and or perivascular spaces.

Probable small meningioma LEFT frontal convexity in addition to
subcentimeter suspected planum sphenoidale meningioma, these could
be further characterized with contrast-enhanced MRI if there is no
contraindication.

MRA HEAD: No large vessel occlusion. Multifocal stenosis of the
anterior and posterior circulation.

Moderate luminal irregularity of the posterior cerebral arteries
likely represents chronic small vessel ischemic disease.

By: Bixer Maurus

## 2016-08-20 ENCOUNTER — Other Ambulatory Visit: Payer: Self-pay | Admitting: Nurse Practitioner

## 2016-08-20 ENCOUNTER — Ambulatory Visit
Admission: RE | Admit: 2016-08-20 | Discharge: 2016-08-20 | Disposition: A | Discharge status: Home / Self Care | Payer: Medicare (Managed Care) | Source: Ambulatory Visit | Attending: Nurse Practitioner | Admitting: Nurse Practitioner

## 2016-08-20 DIAGNOSIS — R059 Cough, unspecified: Secondary | ICD-10-CM

## 2016-08-20 DIAGNOSIS — R05 Cough: Secondary | ICD-10-CM

## 2016-09-25 ENCOUNTER — Other Ambulatory Visit: Payer: Self-pay | Admitting: Family Medicine

## 2016-09-25 DIAGNOSIS — M7989 Other specified soft tissue disorders: Secondary | ICD-10-CM

## 2016-09-25 DIAGNOSIS — M79605 Pain in left leg: Secondary | ICD-10-CM

## 2016-09-26 ENCOUNTER — Ambulatory Visit
Admission: RE | Admit: 2016-09-26 | Discharge: 2016-09-26 | Disposition: A | Discharge status: Home / Self Care | Payer: No Typology Code available for payment source | Source: Ambulatory Visit | Attending: Family Medicine | Admitting: Family Medicine

## 2016-09-26 DIAGNOSIS — M7989 Other specified soft tissue disorders: Secondary | ICD-10-CM

## 2016-09-26 DIAGNOSIS — M79605 Pain in left leg: Secondary | ICD-10-CM

## 2016-10-02 ENCOUNTER — Other Ambulatory Visit: Payer: Self-pay | Admitting: Family Medicine

## 2016-10-02 ENCOUNTER — Other Ambulatory Visit: Payer: Self-pay | Admitting: Nurse Practitioner

## 2016-10-02 ENCOUNTER — Ambulatory Visit
Admission: RE | Admit: 2016-10-02 | Discharge: 2016-10-02 | Disposition: A | Discharge status: Home / Self Care | Payer: Medicare (Managed Care) | Source: Ambulatory Visit | Attending: Family Medicine | Admitting: Family Medicine

## 2016-10-02 ENCOUNTER — Ambulatory Visit
Admission: RE | Admit: 2016-10-02 | Discharge: 2016-10-02 | Disposition: A | Discharge status: Home / Self Care | Payer: Medicare (Managed Care) | Source: Ambulatory Visit | Attending: Nurse Practitioner | Admitting: Nurse Practitioner

## 2016-10-02 DIAGNOSIS — Z9181 History of falling: Secondary | ICD-10-CM

## 2016-10-02 DIAGNOSIS — R52 Pain, unspecified: Secondary | ICD-10-CM

## 2016-10-02 DIAGNOSIS — R410 Disorientation, unspecified: Secondary | ICD-10-CM

## 2016-10-04 ENCOUNTER — Emergency Department (HOSPITAL_COMMUNITY): Payer: Medicare (Managed Care)

## 2016-10-04 ENCOUNTER — Encounter (HOSPITAL_COMMUNITY): Payer: Self-pay

## 2016-10-04 ENCOUNTER — Inpatient Hospital Stay (HOSPITAL_COMMUNITY)
Admission: EM | Admit: 2016-10-04 | Discharge: 2016-10-08 | DRG: 871 | Disposition: A | Discharge status: Skilled Nursing Facility | Payer: Medicare (Managed Care) | Attending: Internal Medicine | Admitting: Internal Medicine

## 2016-10-04 DIAGNOSIS — N183 Chronic kidney disease, stage 3 unspecified: Secondary | ICD-10-CM | POA: Diagnosis present

## 2016-10-04 DIAGNOSIS — R55 Syncope and collapse: Secondary | ICD-10-CM

## 2016-10-04 DIAGNOSIS — G309 Alzheimer's disease, unspecified: Secondary | ICD-10-CM | POA: Diagnosis present

## 2016-10-04 DIAGNOSIS — Z7982 Long term (current) use of aspirin: Secondary | ICD-10-CM

## 2016-10-04 DIAGNOSIS — D638 Anemia in other chronic diseases classified elsewhere: Secondary | ICD-10-CM | POA: Diagnosis present

## 2016-10-04 DIAGNOSIS — A419 Sepsis, unspecified organism: Secondary | ICD-10-CM | POA: Diagnosis present

## 2016-10-04 DIAGNOSIS — K219 Gastro-esophageal reflux disease without esophagitis: Secondary | ICD-10-CM | POA: Diagnosis present

## 2016-10-04 DIAGNOSIS — F0280 Dementia in other diseases classified elsewhere without behavioral disturbance: Secondary | ICD-10-CM | POA: Diagnosis present

## 2016-10-04 DIAGNOSIS — E1140 Type 2 diabetes mellitus with diabetic neuropathy, unspecified: Secondary | ICD-10-CM | POA: Diagnosis present

## 2016-10-04 DIAGNOSIS — B9620 Unspecified Escherichia coli [E. coli] as the cause of diseases classified elsewhere: Secondary | ICD-10-CM | POA: Diagnosis present

## 2016-10-04 DIAGNOSIS — W19XXXA Unspecified fall, initial encounter: Secondary | ICD-10-CM

## 2016-10-04 DIAGNOSIS — N3000 Acute cystitis without hematuria: Secondary | ICD-10-CM | POA: Diagnosis present

## 2016-10-04 DIAGNOSIS — D72829 Elevated white blood cell count, unspecified: Secondary | ICD-10-CM | POA: Diagnosis present

## 2016-10-04 DIAGNOSIS — R945 Abnormal results of liver function studies: Secondary | ICD-10-CM | POA: Diagnosis not present

## 2016-10-04 DIAGNOSIS — E876 Hypokalemia: Secondary | ICD-10-CM | POA: Diagnosis present

## 2016-10-04 DIAGNOSIS — H401290 Low-tension glaucoma, unspecified eye, stage unspecified: Secondary | ICD-10-CM | POA: Diagnosis present

## 2016-10-04 DIAGNOSIS — E039 Hypothyroidism, unspecified: Secondary | ICD-10-CM | POA: Diagnosis present

## 2016-10-04 DIAGNOSIS — W109XXA Fall (on) (from) unspecified stairs and steps, initial encounter: Secondary | ICD-10-CM | POA: Diagnosis present

## 2016-10-04 DIAGNOSIS — Z7901 Long term (current) use of anticoagulants: Secondary | ICD-10-CM | POA: Diagnosis not present

## 2016-10-04 DIAGNOSIS — Z86718 Personal history of other venous thrombosis and embolism: Secondary | ICD-10-CM | POA: Diagnosis not present

## 2016-10-04 DIAGNOSIS — G9341 Metabolic encephalopathy: Secondary | ICD-10-CM | POA: Diagnosis present

## 2016-10-04 DIAGNOSIS — N390 Urinary tract infection, site not specified: Secondary | ICD-10-CM | POA: Diagnosis present

## 2016-10-04 DIAGNOSIS — G301 Alzheimer's disease with late onset: Secondary | ICD-10-CM | POA: Diagnosis not present

## 2016-10-04 DIAGNOSIS — Y92009 Unspecified place in unspecified non-institutional (private) residence as the place of occurrence of the external cause: Secondary | ICD-10-CM | POA: Diagnosis not present

## 2016-10-04 DIAGNOSIS — E038 Other specified hypothyroidism: Secondary | ICD-10-CM | POA: Diagnosis not present

## 2016-10-04 DIAGNOSIS — R7989 Other specified abnormal findings of blood chemistry: Secondary | ICD-10-CM

## 2016-10-04 DIAGNOSIS — Z79899 Other long term (current) drug therapy: Secondary | ICD-10-CM

## 2016-10-04 DIAGNOSIS — J9601 Acute respiratory failure with hypoxia: Secondary | ICD-10-CM | POA: Diagnosis present

## 2016-10-04 DIAGNOSIS — J449 Chronic obstructive pulmonary disease, unspecified: Secondary | ICD-10-CM | POA: Diagnosis present

## 2016-10-04 DIAGNOSIS — F329 Major depressive disorder, single episode, unspecified: Secondary | ICD-10-CM | POA: Diagnosis present

## 2016-10-04 DIAGNOSIS — Y92002 Bathroom of unspecified non-institutional (private) residence single-family (private) house as the place of occurrence of the external cause: Secondary | ICD-10-CM | POA: Diagnosis not present

## 2016-10-04 DIAGNOSIS — F0281 Dementia in other diseases classified elsewhere with behavioral disturbance: Secondary | ICD-10-CM | POA: Diagnosis not present

## 2016-10-04 DIAGNOSIS — I129 Hypertensive chronic kidney disease with stage 1 through stage 4 chronic kidney disease, or unspecified chronic kidney disease: Secondary | ICD-10-CM | POA: Diagnosis present

## 2016-10-04 LAB — CBC WITH DIFFERENTIAL/PLATELET
BASOS PCT: 0 %
BASOS PCT: 0 %
Basophils Absolute: 0 10*3/uL (ref 0.0–0.1)
Basophils Absolute: 0 10*3/uL (ref 0.0–0.1)
EOS ABS: 0 10*3/uL (ref 0.0–0.7)
EOS PCT: 0 %
Eosinophils Absolute: 0 10*3/uL (ref 0.0–0.7)
Eosinophils Relative: 0 %
HCT: 30.2 % — ABNORMAL LOW (ref 36.0–46.0)
HEMATOCRIT: 30 % — AB (ref 36.0–46.0)
HEMOGLOBIN: 9.8 g/dL — AB (ref 12.0–15.0)
Hemoglobin: 10 g/dL — ABNORMAL LOW (ref 12.0–15.0)
Lymphocytes Relative: 6 %
Lymphocytes Relative: 8 %
Lymphs Abs: 0.6 10*3/uL — ABNORMAL LOW (ref 0.7–4.0)
Lymphs Abs: 0.8 10*3/uL (ref 0.7–4.0)
MCH: 26.6 pg (ref 26.0–34.0)
MCH: 27.4 pg (ref 26.0–34.0)
MCHC: 32.5 g/dL (ref 30.0–36.0)
MCHC: 33.3 g/dL (ref 30.0–36.0)
MCV: 82.1 fL (ref 78.0–100.0)
MCV: 82.2 fL (ref 78.0–100.0)
MONO ABS: 0.3 10*3/uL (ref 0.1–1.0)
MONOS PCT: 3 %
Monocytes Absolute: 0.5 10*3/uL (ref 0.1–1.0)
Monocytes Relative: 4 %
NEUTROS PCT: 89 %
Neutro Abs: 9.8 10*3/uL — ABNORMAL HIGH (ref 1.7–7.7)
Neutro Abs: 9.1 10*3/uL — ABNORMAL HIGH (ref 1.7–7.7)
Neutrophils Relative %: 90 %
PLATELETS: 232 10*3/uL (ref 150–400)
Platelets: 180 10*3/uL (ref 150–400)
RBC: 3.68 MIL/uL — AB (ref 3.87–5.11)
RBC: 3.65 MIL/uL — ABNORMAL LOW (ref 3.87–5.11)
RDW: 15.6 % — ABNORMAL HIGH (ref 11.5–15.5)
RDW: 15.8 % — AB (ref 11.5–15.5)
WBC: 10.8 10*3/uL — AB (ref 4.0–10.5)
WBC: 10.2 10*3/uL (ref 4.0–10.5)

## 2016-10-04 LAB — URINALYSIS, ROUTINE W REFLEX MICROSCOPIC
Bilirubin Urine: NEGATIVE
GLUCOSE, UA: NEGATIVE mg/dL
KETONES UR: NEGATIVE mg/dL
NITRITE: NEGATIVE
PH: 5 (ref 5.0–8.0)
Protein, ur: 100 mg/dL — AB
Specific Gravity, Urine: 1.014 (ref 1.005–1.030)
Squamous Epithelial / LPF: NONE SEEN

## 2016-10-04 LAB — COMPREHENSIVE METABOLIC PANEL
ALBUMIN: 3.2 g/dL — AB (ref 3.5–5.0)
ALK PHOS: 77 U/L (ref 38–126)
ALT: 49 U/L (ref 14–54)
ALT: 62 U/L — ABNORMAL HIGH (ref 14–54)
ANION GAP: 12 (ref 5–15)
ANION GAP: 12 (ref 5–15)
AST: 167 U/L — ABNORMAL HIGH (ref 15–41)
AST: 207 U/L — AB (ref 15–41)
Albumin: 3 g/dL — ABNORMAL LOW (ref 3.5–5.0)
Alkaline Phosphatase: 77 U/L (ref 38–126)
BUN: 38 mg/dL — ABNORMAL HIGH (ref 6–20)
BUN: 32 mg/dL — AB (ref 6–20)
CALCIUM: 8.4 mg/dL — AB (ref 8.9–10.3)
CHLORIDE: 113 mmol/L — AB (ref 101–111)
CHLORIDE: 114 mmol/L — AB (ref 101–111)
CO2: 18 mmol/L — AB (ref 22–32)
CO2: 18 mmol/L — ABNORMAL LOW (ref 22–32)
Calcium: 8.2 mg/dL — ABNORMAL LOW (ref 8.9–10.3)
Creatinine, Ser: 2.28 mg/dL — ABNORMAL HIGH (ref 0.44–1.00)
Creatinine, Ser: 1.96 mg/dL — ABNORMAL HIGH (ref 0.44–1.00)
GFR calc Af Amer: 23 mL/min — ABNORMAL LOW (ref >60)
GFR calc non Af Amer: 20 mL/min — ABNORMAL LOW (ref >60)
GFR, EST AFRICAN AMERICAN: 28 mL/min — AB (ref >60)
GFR, EST NON AFRICAN AMERICAN: 24 mL/min — AB (ref >60)
GLUCOSE: 122 mg/dL — AB (ref 65–99)
Glucose, Bld: 110 mg/dL — ABNORMAL HIGH (ref 65–99)
POTASSIUM: 3.8 mmol/L (ref 3.5–5.1)
Potassium: 3.2 mmol/L — ABNORMAL LOW (ref 3.5–5.1)
SODIUM: 143 mmol/L (ref 135–145)
Sodium: 144 mmol/L (ref 135–145)
Total Bilirubin: 0.6 mg/dL (ref 0.3–1.2)
Total Bilirubin: 0.5 mg/dL (ref 0.3–1.2)
Total Protein: 7.1 g/dL (ref 6.5–8.1)
Total Protein: 6.9 g/dL (ref 6.5–8.1)

## 2016-10-04 LAB — PROTIME-INR
INR: 3.82
PROTHROMBIN TIME: 38.5 s — AB (ref 11.4–15.2)

## 2016-10-04 LAB — PROCALCITONIN: Procalcitonin: 55.03 ng/mL

## 2016-10-04 LAB — I-STAT TROPONIN, ED: Troponin i, poc: 0.03 ng/mL (ref 0.00–0.08)

## 2016-10-04 LAB — LACTIC ACID, PLASMA
LACTIC ACID, VENOUS: 1.6 mmol/L (ref 0.5–1.9)
Lactic Acid, Venous: 1.1 mmol/L (ref 0.5–1.9)

## 2016-10-04 LAB — MAGNESIUM: MAGNESIUM: 2 mg/dL (ref 1.7–2.4)

## 2016-10-04 LAB — PHOSPHORUS: PHOSPHORUS: 2.4 mg/dL — AB (ref 2.5–4.6)

## 2016-10-04 MED ORDER — SODIUM BICARBONATE 650 MG PO TABS
650.0000 mg | ORAL_TABLET | Freq: Two times a day (BID) | ORAL | Status: DC
  Administered 2016-10-04 – 2016-10-08 (×8): 650 mg via ORAL
  Filled 2016-10-04 (×8): qty 1

## 2016-10-04 MED ORDER — ACETAMINOPHEN 650 MG RE SUPP: 650.0000 mg | Freq: Four times a day (QID) | RECTAL | Status: DC | PRN

## 2016-10-04 MED ORDER — ALBUTEROL SULFATE HFA 108 (90 BASE) MCG/ACT IN AERS: 1.0000 | INHALATION_SPRAY | Freq: Four times a day (QID) | RESPIRATORY_TRACT | Status: DC | PRN

## 2016-10-04 MED ORDER — ALBUTEROL SULFATE (2.5 MG/3ML) 0.083% IN NEBU: 2.5000 mg | INHALATION_SOLUTION | Freq: Four times a day (QID) | RESPIRATORY_TRACT | Status: DC | PRN

## 2016-10-04 MED ORDER — ESCITALOPRAM OXALATE 10 MG PO TABS
10.0000 mg | ORAL_TABLET | Freq: Every day | ORAL | Status: DC
  Administered 2016-10-04 – 2016-10-08 (×5): 10 mg via ORAL
  Filled 2016-10-04 (×5): qty 1

## 2016-10-04 MED ORDER — DEXTROSE 5 % IV SOLN
1.0000 g | Freq: Once | INTRAVENOUS | Status: AC
  Administered 2016-10-04: 1 g via INTRAVENOUS
  Filled 2016-10-04: qty 10

## 2016-10-04 MED ORDER — LEVOTHYROXINE SODIUM 112 MCG PO TABS
112.0000 ug | ORAL_TABLET | Freq: Every day | ORAL | Status: DC
  Administered 2016-10-05 – 2016-10-07 (×3): 112 ug via ORAL
  Filled 2016-10-04 (×3): qty 1

## 2016-10-04 MED ORDER — POTASSIUM CHLORIDE CRYS ER 20 MEQ PO TBCR
40.0000 meq | EXTENDED_RELEASE_TABLET | Freq: Once | ORAL | Status: AC
  Administered 2016-10-04: 40 meq via ORAL
  Filled 2016-10-04: qty 2

## 2016-10-04 MED ORDER — ACETAMINOPHEN 325 MG PO TABS
650.0000 mg | ORAL_TABLET | Freq: Four times a day (QID) | ORAL | Status: DC | PRN
  Administered 2016-10-04 – 2016-10-07 (×8): 650 mg via ORAL
  Filled 2016-10-04 (×8): qty 2

## 2016-10-04 MED ORDER — SODIUM CHLORIDE 0.9% FLUSH
3.0000 mL | Freq: Two times a day (BID) | INTRAVENOUS | Status: DC
  Administered 2016-10-04 – 2016-10-08 (×3): 3 mL via INTRAVENOUS

## 2016-10-04 MED ORDER — LORATADINE 10 MG PO TABS
10.0000 mg | ORAL_TABLET | Freq: Every day | ORAL | Status: DC
  Administered 2016-10-04 – 2016-10-08 (×5): 10 mg via ORAL
  Filled 2016-10-04 (×5): qty 1

## 2016-10-04 MED ORDER — ONDANSETRON HCL 4 MG PO TABS: 4.0000 mg | ORAL_TABLET | Freq: Four times a day (QID) | ORAL | Status: DC | PRN

## 2016-10-04 MED ORDER — DEXTROSE 5 % IV SOLN: 1.0000 g | INTRAVENOUS | Status: DC

## 2016-10-04 MED ORDER — OXYCODONE-ACETAMINOPHEN 5-325 MG PO TABS
1.0000 | ORAL_TABLET | Freq: Once | ORAL | Status: AC
  Administered 2016-10-04: 1 via ORAL
  Filled 2016-10-04: qty 1

## 2016-10-04 MED ORDER — VITAMIN D3 25 MCG (1000 UNIT) PO TABS
1000.0000 [IU] | ORAL_TABLET | Freq: Every day | ORAL | Status: DC
  Administered 2016-10-04 – 2016-10-08 (×5): 1000 [IU] via ORAL
  Filled 2016-10-04 (×5): qty 1

## 2016-10-04 MED ORDER — CARVEDILOL 3.125 MG PO TABS
3.1250 mg | ORAL_TABLET | Freq: Two times a day (BID) | ORAL | Status: DC
  Administered 2016-10-04 – 2016-10-08 (×8): 3.125 mg via ORAL
  Filled 2016-10-04 (×8): qty 1

## 2016-10-04 MED ORDER — LATANOPROST 0.005 % OP SOLN
1.0000 [drp] | Freq: Every day | OPHTHALMIC | Status: DC
  Administered 2016-10-04 – 2016-10-07 (×4): 1 [drp] via OPHTHALMIC
  Filled 2016-10-04: qty 2.5

## 2016-10-04 MED ORDER — DONEPEZIL HCL 5 MG PO TABS
10.0000 mg | ORAL_TABLET | Freq: Every day | ORAL | Status: DC
  Administered 2016-10-04 – 2016-10-07 (×4): 10 mg via ORAL
  Filled 2016-10-04 (×4): qty 2

## 2016-10-04 MED ORDER — SODIUM CHLORIDE 0.9 % IV SOLN
INTRAVENOUS | Status: DC
  Administered 2016-10-04: 18:00:00 via INTRAVENOUS

## 2016-10-04 MED ORDER — FAMOTIDINE 20 MG PO TABS
20.0000 mg | ORAL_TABLET | Freq: Every day | ORAL | Status: DC
  Administered 2016-10-04 – 2016-10-07 (×4): 20 mg via ORAL
  Filled 2016-10-04 (×4): qty 1

## 2016-10-04 MED ORDER — CHLORHEXIDINE GLUCONATE 0.12 % MT SOLN
15.0000 mL | Freq: Two times a day (BID) | OROMUCOSAL | Status: DC
  Administered 2016-10-04 – 2016-10-08 (×6): 15 mL via OROMUCOSAL
  Filled 2016-10-04 (×7): qty 15

## 2016-10-04 MED ORDER — ONDANSETRON HCL 4 MG/2ML IJ SOLN: 4.0000 mg | Freq: Four times a day (QID) | INTRAMUSCULAR | Status: DC | PRN

## 2016-10-04 MED ORDER — AMLODIPINE BESYLATE 5 MG PO TABS
10.0000 mg | ORAL_TABLET | Freq: Every day | ORAL | Status: DC
  Administered 2016-10-04 – 2016-10-08 (×5): 10 mg via ORAL
  Filled 2016-10-04 (×5): qty 2

## 2016-10-04 MED ORDER — GABAPENTIN 300 MG PO CAPS
300.0000 mg | ORAL_CAPSULE | Freq: Every day | ORAL | Status: DC
  Administered 2016-10-04 – 2016-10-07 (×4): 300 mg via ORAL
  Filled 2016-10-04 (×4): qty 1

## 2016-10-04 MED ORDER — SODIUM CHLORIDE 0.9 % IV BOLUS (SEPSIS)
1000.0000 mL | Freq: Once | INTRAVENOUS | Status: AC
  Administered 2016-10-04: 1000 mL via INTRAVENOUS

## 2016-10-04 MED ORDER — PANTOPRAZOLE SODIUM 40 MG PO TBEC
40.0000 mg | DELAYED_RELEASE_TABLET | Freq: Every day | ORAL | Status: DC
  Administered 2016-10-04 – 2016-10-08 (×5): 40 mg via ORAL
  Filled 2016-10-04 (×5): qty 1

## 2016-10-04 MED ORDER — BRIMONIDINE TARTRATE 0.15 % OP SOLN
1.0000 [drp] | Freq: Every day | OPHTHALMIC | Status: DC
  Administered 2016-10-04 – 2016-10-07 (×4): 1 [drp] via OPHTHALMIC
  Filled 2016-10-04: qty 5

## 2016-10-04 MED ORDER — DEXTROSE 5 % IV SOLN
1.0000 g | INTRAVENOUS | Status: AC
  Administered 2016-10-05 – 2016-10-08 (×4): 1 g via INTRAVENOUS
  Filled 2016-10-04 (×4): qty 10

## 2016-10-04 MED ORDER — ASPIRIN EC 81 MG PO TBEC
81.0000 mg | DELAYED_RELEASE_TABLET | Freq: Every day | ORAL | Status: DC
  Administered 2016-10-04 – 2016-10-08 (×5): 81 mg via ORAL
  Filled 2016-10-04 (×5): qty 1

## 2016-10-04 MED ORDER — MIRTAZAPINE 30 MG PO TABS
30.0000 mg | ORAL_TABLET | Freq: Every day | ORAL | Status: DC
  Administered 2016-10-04 – 2016-10-07 (×4): 30 mg via ORAL
  Filled 2016-10-04 (×3): qty 1
  Filled 2016-10-04 (×3): qty 2
  Filled 2016-10-04: qty 1
  Filled 2016-10-04: qty 2

## 2016-10-04 MED ORDER — ORAL CARE MOUTH RINSE
15.0000 mL | Freq: Two times a day (BID) | OROMUCOSAL | Status: DC
  Administered 2016-10-05 – 2016-10-08 (×6): 15 mL via OROMUCOSAL

## 2016-10-04 NOTE — Progress Notes (Signed)
CSW [place] call from [place] [facility]. Patient is being actively followed by [facility]. Please contact [phone] for any questions regarding patient and follow up information.   Stacy GardnerErin Ingri Diemer, LCSWA Clinical Social Worker [phone]

## 2016-10-04 NOTE — ED Notes (Signed)
Patient transported to X-ray 

## 2016-10-04 NOTE — Progress Notes (Signed)
Pharmacy - Warfarin  Assessment:  Pharmacy consulted to continue warfarin in this [age] yoF admitted 6/8 for recurrent falls and confusion. PMH DVT HTN, hypothyroid, Alzheimer's, CKD3.  Home warfarin regimen 5 mg daily, last dose 6/7  INR currently supratherapeutic at 3.82  Plan:  Hold warfarin  Daily INR  Pharmacy will resume when appropriate  May want to consider risks vs benefits of continuing warfarin in patient with recurrent falls (unless d/t an acute modifiable cause)  Bernadene Personrew Camary Sosa, PharmD, BCPS Pager: [phone] 10/04/2016, 4:26 PM

## 2016-10-04 NOTE — ED Notes (Signed)
Bed: WA20 Expected date:  Expected time:  Means of arrival:  Comments: EMS 76 yo, leg pain

## 2016-10-04 NOTE — ED Triage Notes (Addendum)
Per EMS, pt from home.  Pt c/o left thigh pain.  Pt fell in bathroom today.  Earlier this week she fell down stairs.  Pt was seen by MD and cleared.  No LOC.  Pt has baseline dementia.  Also has shortening in left leg that is chronic and normal.  Pt able to stand and pivot.  Vitals:  142/64, hr 96, 95% ra, resp 16, cbg 132

## 2016-10-04 NOTE — H&P (Signed)
History and Physical    Norma Neal WIO:[id number] DOB: [birth year] DOA: 10/04/2016  Referring MD/NP/PA: [facility]   PCP: Janifer Adie, MD   Patient coming from: home  Chief Complaint: fall  HPI: Norma Neal is a 76 y.o. 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 Pt apparently has history of recurrent falls especially over past 2 weeks as per family report. Her family is not present at bedside at this time to give more details of HPI. Pt felt dizzy prior to the fall but was not entirely sure if she has lost consciousness or not. Per family report in ED, pt apparently more confused since the fall. No chest pain, shortness of breath or palpitations. No abdominal pain, nausea or vomiting. No fevers, chills. No cough. No urinary complaints.   ED Course: In ED, pt was hemodynamically stable. BP was 115/58, HR 102, RR 18-20, oxygen saturation 79% which has improved with 2 L [place] oxygen support to 94%. Blood work showed WBC count 10.8, hemoglobin 9.8, potassium 3.2 (supplemented in ED), CO2 was 18 and creatinine 2.28 (whihc is within baseline range). CXR showed no acute cardiopulmonary findings. X rays of the left humerus and femur showed no acute fractures. 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 She was also found to have UTI and was started on Rocephin while awaiting urine cx results.   Review of Systems:  Constitutional: Negative for fever, chills, diaphoresis, activity change, appetite change and fatigue.  HENT: Negative for ear pain, nosebleeds, congestion, facial swelling, rhinorrhea, neck pain, neck stiffness and ear discharge.   Eyes: Negative for pain, discharge, redness, itching and visual disturbance.  Respiratory: Negative for cough, choking, chest tightness, shortness of breath, wheezing and stridor.     Cardiovascular: Negative for chest pain, palpitations and leg swelling.  Gastrointestinal: Negative for abdominal distention.  Genitourinary: Negative for dysuria, urgency, frequency, hematuria, flank pain, decreased urine volume, difficulty urinating and dyspareunia.  Musculoskeletal: per HPI  Neurological: Negative for dizziness, tremors, seizures, syncope, facial asymmetry, speech difficulty, weakness, light-headedness, numbness and headaches.  Hematological: Negative for adenopathy. Does not bruise/bleed easily.  Psychiatric/Behavioral: Negative for hallucinations, behavioral problems, confusion, dysphoric mood, decreased concentration and agitation.   Past Medical History:  Diagnosis Date  . Alzheimer's disease   . Anemia of other chronic disease   . Chronic kidney disease (CKD), stage III (moderate)   . Chronic obstructive asthma, unspecified   . Dementia   . Diabetes mellitus   . Diabetic neuropathy ([place])   . Drug induced akathisia   . Esophageal reflux   . Hypertension   . Insomnia   . Lichen planus   . 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 Nontoxic multinodular goiter   . 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 Unspecified hypothyroidism   . Unspecified vitamin D deficiency     Past Surgical History:  Procedure Laterality Date  . ABDOMINAL HYSTERECTOMY  yrs ago   ovaries also  . ESOPHAGOGASTRODUODENOSCOPY (EGD) WITH PROPOFOL N/A 03/31/2014   Procedure: ESOPHAGOGASTRODUODENOSCOPY (EGD) WITH PROPOFOL;  Surgeon: Milus Banister, MD;  Location: [facility];  Service: Endoscopy;  Laterality: N/A;  . KNEE SURGERY Left yrs ago  . partial thryoidectomy  yrs ago    Social history:  reports that she has never smoked. She has never used smokeless tobacco. She reports that she does not drink alcohol or  use drugs.  Ambulation:   No Known Allergies  Family History  Problem Relation Age of Onset  . Cancer Brother        lung  . Cancer Sister        ?  Marland Kitchen Breast cancer  Sister        x2  . Heart attack Sister   . Breast cancer Cousin        Neice    Prior to Admission medications   Medication Sig Start Date End Date Taking? Authorizing Provider  albuterol (PROVENTIL HFA;VENTOLIN HFA) 108 (90 BASE) MCG/ACT inhaler Inhale 1-2 puffs into the lungs every 6 (six) hours as needed for wheezing or shortness of breath.   Yes [provider]  albuterol (PROVENTIL) (2.5 MG/3ML) 0.083% nebulizer solution Take 2.5 mg by nebulization every 6 (six) hours as needed for wheezing or shortness of breath.   Yes [provider]  amLODipine (NORVASC) 10 MG tablet Take 10 mg by mouth daily.   Yes [provider]  aspirin EC 81 MG tablet Take 81 mg by mouth daily.   Yes [provider]  brimonidine (ALPHAGAN) 0.15 % ophthalmic solution Place 1 drop into both eyes at bedtime.    Yes [provider]  carvedilol (COREG) 3.125 MG tablet Take 1 tablet (3.125 mg total) by mouth 2 (two) times daily with a meal. 01/23/15  Yes Eugenie Filler, MD  cetirizine (ZYRTEC) 10 MG tablet Take 10 mg by mouth daily.   Yes [provider]  cholecalciferol (VITAMIN D) 1000 UNITS tablet Take 1,000 Units by mouth daily.   Yes [provider]  donepezil (ARICEPT) 10 MG tablet Take 10 mg by mouth at bedtime.   Yes [provider]  escitalopram (LEXAPRO) 10 MG tablet Take 10 mg by mouth daily.   Yes [provider]  esomeprazole (NEXIUM) 40 MG capsule Take 1 capsule (40 mg total) by mouth 2 (two) times daily before a meal. 04/17/16  Yes Esterwood, Amy S, PA-C  gabapentin (NEURONTIN) 300 MG capsule Take 300 mg by mouth at bedtime.   Yes [provider]  latanoprost (XALATAN) 0.005 % ophthalmic solution Place 1 drop into both eyes at bedtime.   Yes [provider]  levothyroxine (SYNTHROID, LEVOTHROID) 112 MCG tablet Take 112 mcg by mouth daily before breakfast.   Yes [provider]  mirtazapine  (REMERON) 30 MG tablet Take 30 mg by mouth at bedtime.   Yes [provider]  ranitidine (ZANTAC) 150 MG tablet Take 150 mg by mouth at bedtime.   Yes [provider]  sodium bicarbonate 650 MG tablet Take 650 mg by mouth 2 (two) times daily.   Yes [provider]  warfarin (COUMADIN) 5 MG tablet Take 5 mg by mouth daily. 09/26/16  Yes [provider]  aspirin 325 MG tablet Take 1 tablet (325 mg total) by mouth daily. Patient not taking: Reported on 10/04/2016 08/19/14   Kelby Aline, MD  docusate sodium (COLACE) 100 MG capsule Take 1 capsule (100 mg total) by mouth 2 (two) times daily. Patient not taking: Reported on 10/04/2016 01/23/15   Eugenie Filler, MD  lisinopril (PRINIVIL,ZESTRIL) 40 MG tablet Take 1 tablet (40 mg total) by mouth daily. Patient not taking: Reported on 10/04/2016 01/23/15   Eugenie Filler, MD  pantoprazole (PROTONIX) 40 MG tablet Take 1 tablet (40 mg total) by mouth 2 (two) times daily before a meal. Patient not taking: Reported on 10/04/2016 03/02/14  Milus Banister, MD  risperiDONE (RISPERDAL M-TABS) 0.5 MG disintegrating tablet Take 1 tablet (0.5 mg total) by mouth at bedtime. Patient not taking: Reported on 10/04/2016 01/23/15   Eugenie Filler, MD  risperiDONE (RISPERDAL M-TABS) 1 MG disintegrating tablet Take 1 tablet (1 mg total) by mouth daily. Patient not taking: Reported on 10/04/2016 01/23/15   Eugenie Filler, MD  sucralfate (CARAFATE) 1 GM/10ML suspension Take 10 mLs (1 g total) by mouth 4 (four) times daily -  with meals and at bedtime. Patient not taking: Reported on 10/04/2016 04/17/16   Alfredia Ferguson, PA-C    Physical Exam: Vitals:   10/04/16 1004 10/04/16 1130 10/04/16 1307 10/04/16 1510  BP: 131/70 (!) 162/98 130/74 (!) 115/58  Pulse: (!) 102 60 (!) 56 90  Resp: 18  14 15   Temp:      TempSrc:      SpO2: 94% (!) 79% 100% 94%  Weight:      Height:        Constitutional: NAD, calm, comfortable Vitals:    10/04/16 1004 10/04/16 1130 10/04/16 1307 10/04/16 1510  BP: 131/70 (!) 162/98 130/74 (!) 115/58  Pulse: (!) 102 60 (!) 56 90  Resp: 18  14 15   Temp:      TempSrc:      SpO2: 94% (!) 79% 100% 94%  Weight:      Height:       Eyes: PERRL, lids and conjunctivae normal ENMT: Mucous membranes are moist. Posterior pharynx clear of any exudate or lesions.Normal dentition.  Neck: normal, supple, no masses, no thyromegaly Respiratory: clear to auscultation bilaterally, no wheezing, no crackles. Normal respiratory effort. No accessory muscle use.  Cardiovascular: Regular rate and rhythm, 2+ pedal pulses.  Abdomen: no tenderness, no masses palpated. No hepatosplenomegaly. Bowel sounds positive.  Musculoskeletal: no clubbing / cyanosis. No joint deformity upper and lower extremities. Good ROM, no contractures. Normal muscle tone.  Skin: no rashes, lesions, ulcers. No induration Neurologic: CN 2-12 grossly intact. Sensation intact, DTR normal. Strength 5/5 in all 4.  Psychiatric: Normal judgment and insight. Alert and oriented x 3. Normal mood.     Labs on Admission: I have personally reviewed following labs and imaging studies  CBC:  Recent Labs Lab 10/04/16 1207  WBC 10.8*  NEUTROABS 9.8*  HGB 9.8*  HCT 30.2*  MCV 82.1  PLT 026   Basic Metabolic Panel:  Recent Labs Lab 10/04/16 1207  NA 143  K 3.2*  CL 113*  CO2 18*  GLUCOSE 122*  BUN 38*  CREATININE 2.28*  CALCIUM 8.4*   GFR: Estimated Creatinine Clearance: 17.6 mL/min (A) (by C-G formula based on SCr of 2.28 mg/dL (H)). Liver Function Tests:  Recent Labs Lab 10/04/16 1207  AST 167*  ALT 49  ALKPHOS 77  BILITOT 0.6  PROT 7.1  ALBUMIN 3.2*   No results for input(s): LIPASE, AMYLASE in the last 168 hours. No results for input(s): AMMONIA in the last 168 hours. Coagulation Profile:  Recent Labs Lab 10/04/16 1207  INR 3.82   Cardiac Enzymes: No results for input(s): CKTOTAL, CKMB, CKMBINDEX, TROPONINI in  the last 168 hours. BNP (last 3 results) No results for input(s): PROBNP in the last 8760 hours. HbA1C: No results for input(s): HGBA1C in the last 72 hours. CBG: No results for input(s): GLUCAP in the last 168 hours. Lipid Profile: No results for input(s): CHOL, HDL, LDLCALC, TRIG, CHOLHDL, LDLDIRECT in the last 72 hours. Thyroid Function Tests: No results for  input(s): TSH, T4TOTAL, FREET4, T3FREE, THYROIDAB in the last 72 hours. Anemia Panel: No results for input(s): VITAMINB12, FOLATE, FERRITIN, TIBC, IRON, RETICCTPCT in the last 72 hours. Urine analysis:    Component Value Date/Time   COLORURINE YELLOW 10/04/2016 1023   APPEARANCEUR CLOUDY (A) 10/04/2016 1023   LABSPEC 1.014 10/04/2016 1023   PHURINE 5.0 10/04/2016 1023   GLUCOSEU NEGATIVE 10/04/2016 1023   HGBUR LARGE (A) 10/04/2016 1023   BILIRUBINUR NEGATIVE 10/04/2016 1023   KETONESUR NEGATIVE 10/04/2016 1023   PROTEINUR 100 (A) 10/04/2016 1023   UROBILINOGEN 0.2 01/18/2015 2304   NITRITE NEGATIVE 10/04/2016 1023   LEUKOCYTESUR LARGE (A) 10/04/2016 1023   Sepsis Labs: @LABRCNTIP (procalcitonin:4,lacticidven:4) )No results found for this or any previous visit (from the past 240 hour(s)).   Radiological Exams on Admission: Dg Chest 2 View  Result Date: 10/04/2016 CLINICAL DATA:  Trauma secondary to a fall at home today. Multiple recent falls. EXAM: CHEST  2 VIEW COMPARISON:  Chest x-ray dated 08/20/2016 and 01/18/2015 FINDINGS: There is chronic deviation of the trachea to the left at the level of the thoracic inlet consistent with an enlarged right lobe of the thyroid gland. This is essentially unchanged since the prior exam. The deviation has increased slightly since 01/18/2015. Overall heart size is normal. Pulmonary vascularity is normal. The lungs are clear. Chronic deformity of the upper bony thorax, unchanged. Chronic degenerative changes of both shoulders. No acute bone abnormality. IMPRESSION: No active  cardiopulmonary disease. Electronically Signed   By: Lorriane Shire M.D.   On: 10/04/2016 11:01   Ct Head Wo Contrast  Result Date: 10/04/2016 CLINICAL DATA:  Dementia, fell in bathroom today, earlier this week fell down stairs, no loss of consciousness, recurrent falls, dizziness, hypertension, diabetes mellitus, Alzheimer's EXAM: CT HEAD WITHOUT CONTRAST TECHNIQUE: Contiguous axial images were obtained from the base of the skull through the vertex without intravenous contrast. Sagittal and coronal MPR images reconstructed from axial data set. COMPARISON:  10/02/2016 FINDINGS: Brain: Mild generalized atrophy. Normal ventricular morphology. No midline shift or mass effect. Small vessel chronic ischemic changes of deep cerebral white matter. Multiple old infarcts including BILATERAL occipital lobes, RIGHT frontal lobe, RIGHT temporal lobe, and high LEFT frontal lobe. No new areas of infarction or intracranial hemorrhage. No extra-axial fluid collections. Calcified nodule LEFT parafalcine at frontal region could represent a dural calcification or a calcified meningioma 11 mm diameter, unchanged. Vascular: Unremarkable Skull: Demineralized but intact Sinuses/Orbits: Clear Other: N/A IMPRESSION: Atrophy with small vessel chronic ischemic changes of deep cerebral white matter. Multiple old infarcts. No acute intracranial abnormalities. Prominent dural calcification versus 11 mm calcified meningioma in the LEFT frontal parafalcine region unchanged. Electronically Signed   By: Lavonia Dana M.D.   On: 10/04/2016 11:08   Dg Humerus Left  Result Date: 10/04/2016 CLINICAL DATA:  Left arm pain secondary to a fall in the bathroom at home today. EXAM: LEFT HUMERUS - 2+ VIEW COMPARISON:  Shoulder radiographs dated 10/03/2014 FINDINGS: There is no fracture or dislocation. There is moderate arthritis of the glenohumeral joint. No appreciable joint effusions. IMPRESSION: No acute abnormality.  Moderate arthritis of the  glenohumeral joint. Electronically Signed   By: Lorriane Shire M.D.   On: 10/04/2016 11:05   Dg Hip Unilat W Or W/o Pelvis 2-3 Views Left  Result Date: 10/04/2016 CLINICAL DATA:  Left hip pain secondary to a fall at home in the bathroom today. EXAM: DG HIP (WITH OR WITHOUT PELVIS) 2-3V LEFT COMPARISON:  None. FINDINGS: There is slight  arthritic changes of the left hip. There is no fracture or dislocation. There are low similar but less prominent arthritic changes of the right hip. Pelvic bones are intact. Degenerative facet arthritis in the lower lumbar spine. IMPRESSION: No acute abnormalities. Electronically Signed   By: Lorriane Shire M.D.   On: 10/04/2016 11:04   Dg Femur Min 2 Views Left  Result Date: 10/04/2016 CLINICAL DATA:  Left hip and leg pain secondary to a fall today. EXAM: LEFT FEMUR 2 VIEWS COMPARISON:  None. FINDINGS: There is slight medial joint space narrowing of the left hip. Slight marginal osteophyte formation on the femoral head and acetabulum. No fracture or dislocation. No appreciable joint effusions. Slight degenerative changes of the knee joint. IMPRESSION: No acute abnormality of the left femur. Slight arthritic changes at the knee and hip. Electronically Signed   By: Lorriane Shire M.D.   On: 10/04/2016 11:02    EKG: Pending   Assessment/Plan  Principal Problem:   Fall at home, initial encounter - Obtain PT eval - No acute fractures on x rays (left hip and left arm)  - No acute intracranial findings on CT head other than chronic ischemic changes and old infarcts   Active Problems:   Acute respiratory failure with hypoxia (HCC) - Stable respiratory status - Oxygen saturation now in mid 90's with [place] 2 L oxygen  - No acute cardiopulmonary findings on CXR    Acute lower UTI / Leukocytosis / Sepsis - Sepsis criteria met on admission with leukocytosis, tachycardia, tachypnea, hypoxia - UA showed many bacteria and TNTC WBC - Started empiric Rocephin - Follow up urine  cx results    Anemia of chronic disease - Hgb stable    Essential hypertension - Continue Norvasc and carvedilol     Alzheimer's disease / Acute metabolic encephalopathy  - Continue donepezil, lexapro    Hypothyroidism - Continue synthroid    CKD (chronic kidney disease) stage 3, GFR 81-82 ml/min / Metabolic acidosis  - Cr in 2013 was as high as 2.3  - Cr on this admission is within baseline range - Continue sodium bicarbonate     Hypokalemia - Due to UTI - Supplemented in ED - Follow up BMP in am    History of DVT - Coumadin on hold due to supratherapeutic INR   DVT prophylaxis: INR supratherapeutic, pt on coumadin  Code Status: full code Family Communication: no family at the bedside  Disposition Plan: admission to telemetry  Consults called: PT  Admission status: Inpatient, pt requires admission for UA and evaluation of her fall. She is on IV rocephin and she will require follow up of urine cx as well as PT eval for safe discharge plan.   Leisa Lenz MD [facility] Pager [phone]  If 7PM-7AM, please contact night-coverage [email or web address] Password [email or web address]  10/04/2016, 3:21 PM

## 2016-10-04 NOTE — ED Provider Notes (Signed)
[facility]-EMERGENCY DEPT Provider Note   CSN: [id number] Arrival date & time: 10/04/16  4098     History   Chief Complaint Chief Complaint  Patient presents with  . Fall  . Leg Pain    HPI Norma Neal is a 76 y.o. female.  The history is provided by the patient and medical records.  Fall   Leg Pain      Level V caveat: Dementia 76 year old female with history of Alzheimer's dementia, chronic kidney disease, anemia, diabetes, diabetic neuropathy, GERD, hypertension, depression, vitamin D deficiency, presenting to the ED with recurrent falls.  Additional history is provided by patient's son who is at the bedside.  He reports over the past week she has had multiple falls. She was evaluated by PCP 2 days ago and sent for a CT scan of her head as well as some x-rays. States he was told everything was normal. Patient has had 2 additional falls in the past 24 hours. She reports her son that she felt dizzy. She reports to me that she is "blacking out" and realizing she fell only when she wakes up on the floor. She has hit her head, but unsure of loss of consciousness. Reports she has seemed much more "confused" from her baseline since falls. States her memory is very poor. Patient does have a blood clot in the left leg, currently on Coumadin for this. Son reports she has a walker as well as a wheelchair.  Past Medical History:  Diagnosis Date  . Alzheimer's disease   . Anemia of other chronic disease   . Chronic kidney disease (CKD), stage III (moderate)   . Chronic obstructive asthma, unspecified   . Dementia   . Diabetes mellitus   . Diabetic neuropathy (HCC)   . Drug induced akathisia   . Esophageal reflux   . Hypertension   . Insomnia   . Lichen planus   . 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 Nontoxic multinodular goiter   . Psychotic depression (HCC)   . Unspecified hypothyroidism   . Unspecified vitamin D deficiency     Patient Active Problem List   Diagnosis Date  Noted  . SBO (small bowel obstruction) (HCC) 01/19/2015  . Abnormal finding on breast imaging 01/19/2015  . CKD (chronic kidney disease)   . Secondary hypertension, unspecified   . Alzheimer's disease 08/18/2014  . Hypothyroidism 08/18/2014  . Atypical chest pain 08/18/2014  . Ataxia   . Other specified transient cerebral ischemias   . CKD (chronic kidney disease) stage 3, GFR 30-59 ml/min   . TIA (transient ischemic attack) 08/17/2014  . Acute renal failure superimposed on stage 3 chronic kidney disease (HCC) 08/17/2014  . Anemia of chronic disease 08/17/2014  . Elevated CK 08/17/2014    Past Surgical History:  Procedure Laterality Date  . ABDOMINAL HYSTERECTOMY  yrs ago   ovaries also  . ESOPHAGOGASTRODUODENOSCOPY (EGD) WITH PROPOFOL N/A 03/31/2014   Procedure: ESOPHAGOGASTRODUODENOSCOPY (EGD) WITH PROPOFOL;  Surgeon: Rachael Fee, MD;  Location: [facility] ENDOS[facility];  Service: Endoscopy;  Laterality: N/A;  . KNEE SURGERY Left yrs ago  . partial thryoidectomy  yrs ago    OB History    No data available       Home Medications    Prior to Admission medications   Medication Sig Start Date End Date Taking? Authorizing Provider  albuterol (PROVENTIL HFA;VENTOLIN HFA) 108 (90 BASE) MCG/ACT inhaler Inhale 1-2 puffs into the lungs every 6 (six) hours as needed for wheezing or shortness  of breath.    [provider]  albuterol (PROVENTIL) (2.5 MG/3ML) 0.083% nebulizer solution Take 2.5 mg by nebulization every 6 (six) hours as needed for wheezing or shortness of breath.    [provider]  aspirin 325 MG tablet Take 1 tablet (325 mg total) by mouth daily. 08/19/14   Lorenda Hatchetothman, Adam L, MD  atorvastatin (LIPITOR) 10 MG tablet Take 10 mg by mouth at bedtime.    [provider]  brimonidine (ALPHAGAN) 0.15 % ophthalmic solution Place 1 drop into both eyes at bedtime.     [provider]  carvedilol (COREG) 3.125 MG tablet Take 1 tablet (3.125 mg total) by  mouth 2 (two) times daily with a meal. 01/23/15   Rodolph Bonghompson, Daniel V, MD  cholecalciferol (VITAMIN D) 1000 UNITS tablet Take 1,000 Units by mouth daily.    [provider]  docusate sodium (COLACE) 100 MG capsule Take 1 capsule (100 mg total) by mouth 2 (two) times daily. 01/23/15   Rodolph Bonghompson, Daniel V, MD  donepezil (ARICEPT) 10 MG tablet Take 10 mg by mouth at bedtime.    [provider]  esomeprazole (NEXIUM) 40 MG capsule Take 1 capsule (40 mg total) by mouth 2 (two) times daily before a meal. 04/17/16   Esterwood, Amy S, PA-C  latanoprost (XALATAN) 0.005 % ophthalmic solution Place 1 drop into both eyes at bedtime.    [provider]  levothyroxine (SYNTHROID, LEVOTHROID) 125 MCG tablet Take 125 mcg by mouth daily before breakfast.    [provider]  lisinopril (PRINIVIL,ZESTRIL) 40 MG tablet Take 1 tablet (40 mg total) by mouth daily. 01/23/15   Rodolph Bonghompson, Daniel V, MD  memantine (NAMENDA) 10 MG tablet Take 10 mg by mouth 2 (two) times daily.    [provider]  mirtazapine (REMERON) 30 MG tablet Take 30 mg by mouth at bedtime.    [provider]  pantoprazole (PROTONIX) 40 MG tablet Take 1 tablet (40 mg total) by mouth 2 (two) times daily before a meal. 03/02/14   Rachael FeeJacobs, Daniel P, MD  polyethylene glycol [facility](MIRALAX / Ethelene HalGLYCOLAX) packet Take 17 g by mouth daily as needed for moderate constipation.    [provider]  ranitidine (ZANTAC) 150 MG tablet Take 150 mg by mouth at bedtime.    [provider]  risperiDONE (RISPERDAL M-TABS) 0.5 MG disintegrating tablet Take 1 tablet (0.5 mg total) by mouth at bedtime. 01/23/15   Rodolph Bonghompson, Daniel V, MD  risperiDONE (RISPERDAL M-TABS) 1 MG disintegrating tablet Take 1 tablet (1 mg total) by mouth daily. 01/23/15   Rodolph Bonghompson, Daniel V, MD  sennosides-docusate sodium (SENOKOT-S) 8.6-50 MG tablet Take 2 tablets by mouth at bedtime.    [provider]  simethicone (MYLICON) 125 MG chewable  tablet Chew 125 mg by mouth 3 (three) times daily as needed for flatulence.    [provider]  sucralfate (CARAFATE) 1 GM/10ML suspension Take 10 mLs (1 g total) by mouth 4 (four) times daily -  with meals and at bedtime. 04/17/16   Esterwood, Amy S, PA-C    Family History Family History  Problem Relation Age of Onset  . Cancer Brother        lung  . Cancer Sister        ?  Marland Kitchen. Breast cancer Sister        x2  . Heart attack Sister   . Breast cancer Cousin        Neice    Social History Social  History  Substance Use Topics  . Smoking status: Never Smoker  . Smokeless tobacco: Never Used  . Alcohol use No     Allergies   Patient has no known allergies.   Review of Systems Review of Systems  Unable to perform ROS: Dementia     Physical Exam Updated Vital Signs BP 131/70 (BP Location: Left Arm)   Pulse (!) 102   Temp 99 F (37.2 C) (Oral)   Resp 18   Ht 5\' 1"  (1.549 m)   Wt 59 kg (130 lb)   SpO2 94%   BMI 24.56 kg/m   Physical Exam  Constitutional: She appears well-developed and well-nourished.  HENT:  Head: Normocephalic and atraumatic.  Mouth/Throat: Oropharynx is clear and moist.  No visible signs of head trauma  Eyes: Conjunctivae and EOM are normal. Pupils are equal, round, and reactive to light.  Neck: Normal range of motion.  Cardiovascular: Normal rate, regular rhythm and normal heart sounds.   Pulmonary/Chest: Effort normal and breath sounds normal.  Abdominal: Soft. Bowel sounds are normal.  Musculoskeletal: Normal range of motion.  Tenderness of left hip and femur without noted deformity, able to flex and extend hip without issue, there is some leg shortening, however family reports this is chronic, DP pulses intact bilaterally Mild tenderness of the left humerus, no swelling or bruising  Neurological: She is alert.  Patient is awake and alert, oriented to self and situation, limited movement of the left leg, but moving her other  extremities without apparent issue  Skin: Skin is warm and dry.  Psychiatric: She has a normal mood and affect.  Nursing note and vitals reviewed.    ED Treatments / Results  Labs (all labs ordered are listed, but only abnormal results are displayed) Labs Reviewed  CBC WITH DIFFERENTIAL/PLATELET - Abnormal; Notable for the following:       Result Value   WBC 10.8 (*)    RBC 3.68 (*)    Hemoglobin 9.8 (*)    HCT 30.2 (*)    RDW 15.6 (*)    Neutro Abs 9.8 (*)    Lymphs Abs 0.6 (*)    All other components within normal limits  COMPREHENSIVE METABOLIC PANEL - Abnormal; Notable for the following:    Potassium 3.2 (*)    Chloride 113 (*)    CO2 18 (*)    Glucose, Bld 122 (*)    BUN 38 (*)    Creatinine, Ser 2.28 (*)    Calcium 8.4 (*)    Albumin 3.2 (*)    AST 167 (*)    GFR calc non Af Amer 20 (*)    GFR calc Af Amer 23 (*)    All other components within normal limits  PROTIME-INR - Abnormal; Notable for the following:    Prothrombin Time 38.5 (*)    All other components within normal limits  URINALYSIS, ROUTINE W REFLEX MICROSCOPIC - Abnormal; Notable for the following:    APPearance CLOUDY (*)    Hgb urine dipstick LARGE (*)    Protein, ur 100 (*)    Leukocytes, UA LARGE (*)    Bacteria, UA MANY (*)    All other components within normal limits  URINE CULTURE  I-STAT TROPOININ, ED    EKG  EKG Interpretation  Date/Time:  Friday October 04 2016 11:01:36 EDT Ventricular Rate:  97 PR Interval:    QRS Duration: 85 QT Interval:  424 QTC Calculation: 539 R Axis:   79 Text Interpretation:  Sinus rhythm Anteroseptal infarct, old Prolonged QT interval Baseline wander in lead(s) V5 Confirmed by Freida Busman  MD, ANTHONY ([id number]) on 10/04/2016 11:12:53 AM Also confirmed by Freida Busman  MD, ANTHONY ([id number]), editor Rollen Sox [phone])  on 10/04/2016 12:19:03 PM       Radiology Dg Chest 2 View  Result Date: 10/04/2016 CLINICAL DATA:  Trauma secondary to a fall at home today. Multiple  recent falls. EXAM: CHEST  2 VIEW COMPARISON:  Chest x-ray dated 08/20/2016 and 01/18/2015 FINDINGS: There is chronic deviation of the trachea to the left at the level of the thoracic inlet consistent with an enlarged right lobe of the thyroid gland. This is essentially unchanged since the prior exam. The deviation has increased slightly since 01/18/2015. Overall heart size is normal. Pulmonary vascularity is normal. The lungs are clear. Chronic deformity of the upper bony thorax, unchanged. Chronic degenerative changes of both shoulders. No acute bone abnormality. IMPRESSION: No active cardiopulmonary disease. Electronically Signed   By: Francene Boyers M.D.   On: 10/04/2016 11:01   Dg Cervical Spine Complete  Result Date: 10/02/2016 CLINICAL DATA:  Patient c/o lateral left cervical spine pain that radiates into posterior left shoulder; patient fell down the stairs today; patient did not want to remove her earrings due to recently having them pierced EXAM: CERVICAL SPINE - COMPLETE 4+ VIEW COMPARISON:  None. FINDINGS: Normal alignment. Vertebral body and disc height maintained throughout. Early anterior endplate spurring Q5-Z5. No prevertebral soft tissue swelling. Multiple missing teeth and dental restorations. IMPRESSION: 1. Negative for fracture or other acute finding. 2. Mild degenerative spurring C3-C6. Electronically Signed   By: Corlis Leak M.D.   On: 10/02/2016 14:57   Ct Head Wo Contrast  Result Date: 10/04/2016 CLINICAL DATA:  Dementia, fell in bathroom today, earlier this week fell down stairs, no loss of consciousness, recurrent falls, dizziness, hypertension, diabetes mellitus, Alzheimer's EXAM: CT HEAD WITHOUT CONTRAST TECHNIQUE: Contiguous axial images were obtained from the base of the skull through the vertex without intravenous contrast. Sagittal and coronal MPR images reconstructed from axial data set. COMPARISON:  10/02/2016 FINDINGS: Brain: Mild generalized atrophy. Normal ventricular  morphology. No midline shift or mass effect. Small vessel chronic ischemic changes of deep cerebral white matter. Multiple old infarcts including BILATERAL occipital lobes, RIGHT frontal lobe, RIGHT temporal lobe, and high LEFT frontal lobe. No new areas of infarction or intracranial hemorrhage. No extra-axial fluid collections. Calcified nodule LEFT parafalcine at frontal region could represent a dural calcification or a calcified meningioma 11 mm diameter, unchanged. Vascular: Unremarkable Skull: Demineralized but intact Sinuses/Orbits: Clear Other: N/A IMPRESSION: Atrophy with small vessel chronic ischemic changes of deep cerebral white matter. Multiple old infarcts. No acute intracranial abnormalities. Prominent dural calcification versus 11 mm calcified meningioma in the LEFT frontal parafalcine region unchanged. Electronically Signed   By: Ulyses Southward M.D.   On: 10/04/2016 11:08   Ct Head Wo Contrast  Result Date: 10/02/2016 CLINICAL DATA:  S/p fall this am , patient blacked out and is unsure of how she fell Her head hurts all over Confusion No hx of cancer No hx of surgery Prev x rays in pacs EXAM: CT HEAD WITHOUT CONTRAST TECHNIQUE: Contiguous axial images were obtained from the base of the skull through the vertex without intravenous contrast. COMPARISON:  08/17/2014 FINDINGS: Brain: Chronic bilateral occipital infarcts. Hypoattenuation in the right frontal lobe not present previously suggests interval infarct, age otherwise indeterminate. No acute intracranial hemorrhage. No midline shift. Mild diffuse parenchymal atrophy. Patchy areas of  hypoattenuation in deep and periventricular white matter bilaterally. 12 mm partially calcified left frontal parafalcine lesion is stable, possibly meningioma. Acute infarct may be inapparent on noncontrast CT. Ventricles nondilated. Vascular: No hyperdense vessel. Atherosclerotic and physiologic intracranial calcifications. Skull: Normal. Negative for fracture or  focal lesion. Sinuses/Orbits: No acute finding. Other: None. IMPRESSION: 1. New focal right frontal parenchymal hypoattenuation since 08/17/2014, suggesting interval infarct. 2. Negative for  intracranial hemorrhage or other acute finding. 3. Atrophy, nonspecific white matter changes, and chronic bilateral occipital infarcts. Electronically Signed   By: Corlis Leak M.D.   On: 10/02/2016 15:04   Dg Shoulder Left  Result Date: 10/02/2016 CLINICAL DATA:  Left shoulder pain following fall, initial encounter EXAM: LEFT SHOULDER - 2+ VIEW COMPARISON:  None. FINDINGS: There is no evidence of fracture or dislocation. There is no evidence of arthropathy or other focal bone abnormality. Soft tissues are unremarkable. IMPRESSION: No acute abnormality noted. Electronically Signed   By: Alcide Clever M.D.   On: 10/02/2016 14:57   Dg Humerus Left  Result Date: 10/04/2016 CLINICAL DATA:  Left arm pain secondary to a fall in the bathroom at home today. EXAM: LEFT HUMERUS - 2+ VIEW COMPARISON:  Shoulder radiographs dated 10/03/2014 FINDINGS: There is no fracture or dislocation. There is moderate arthritis of the glenohumeral joint. No appreciable joint effusions. IMPRESSION: No acute abnormality.  Moderate arthritis of the glenohumeral joint. Electronically Signed   By: Francene Boyers M.D.   On: 10/04/2016 11:05   Dg Foot Complete Left  Result Date: 10/02/2016 CLINICAL DATA:  Patient fell down the stairs today; c/o pain in entire left foot. EXAM: LEFT FOOT - COMPLETE 3+ VIEW COMPARISON:  None. FINDINGS: Mild degenerative changes at the first MTP and great toe interphalangeal joints. No fracture or dislocation. Normal mineralization and alignment. Small calcaneal spur at the plantar aponeurosis. Regional soft tissues unremarkable. IMPRESSION: 1. Negative for fracture or other acute finding. 2. Mild degenerative changes as above. Electronically Signed   By: Corlis Leak M.D.   On: 10/02/2016 14:56   Dg Hip Unilat W Or W/o  Pelvis 2-3 Views Left  Result Date: 10/04/2016 CLINICAL DATA:  Left hip pain secondary to a fall at home in the bathroom today. EXAM: DG HIP (WITH OR WITHOUT PELVIS) 2-3V LEFT COMPARISON:  None. FINDINGS: There is slight arthritic changes of the left hip. There is no fracture or dislocation. There are low similar but less prominent arthritic changes of the right hip. Pelvic bones are intact. Degenerative facet arthritis in the lower lumbar spine. IMPRESSION: No acute abnormalities. Electronically Signed   By: Francene Boyers M.D.   On: 10/04/2016 11:04   Dg Femur Min 2 Views Left  Result Date: 10/04/2016 CLINICAL DATA:  Left hip and leg pain secondary to a fall today. EXAM: LEFT FEMUR 2 VIEWS COMPARISON:  None. FINDINGS: There is slight medial joint space narrowing of the left hip. Slight marginal osteophyte formation on the femoral head and acetabulum. No fracture or dislocation. No appreciable joint effusions. Slight degenerative changes of the knee joint. IMPRESSION: No acute abnormality of the left femur. Slight arthritic changes at the knee and hip. Electronically Signed   By: Francene Boyers M.D.   On: 10/04/2016 11:02    Procedures Procedures (including critical care time)  Medications Ordered in ED Medications - No data to display   Initial Impression / Assessment and Plan / ED Course  I have reviewed the triage vital signs and the nursing notes.  Pertinent labs &  imaging results that were available during my care of the patient were reviewed by me and considered in my medical decision making (see chart for details).  76 year old female brought in by son for recurrent falls and increased confusion. Upon further discussing this with patient and son, it seems as if these are actually syncopal events. She has had head injury.  He is currently on Coumadin for DVT in left leg.  Here she is awake and alert, oriented to self and situation. She has limited movement of the left leg but is moving all  her other extremities well.  She has no gross bony deformities on exam-- does have baseline leg shortening of left leg.  She does have some repetitive questioning-- question if from her dementia vs new. We'll plan for labs, UA, EKG, imaging studies.    Labs with evidence of acute kidney injury, serum creatinine is 2.28 today (up from baseline around 1.2 based on prior values).  UA does appear infectious.  Culture pending.  Imaging studies negative.  Feel she would benefit from further evaluation for syncope, IVF, and abx for UTI.  Started rocephin in ED.  Discussed with hospitalist, Dr. Elisabeth Pigeon-- will admit to tele bed.  Final Clinical Impressions(s) / ED Diagnoses   Final diagnoses:  Fall  Syncope, unspecified syncope type  Acute cystitis without hematuria    New Prescriptions New Prescriptions   No medications on file     Garlon Hatchet, PA-C 10/04/16 1357    Lorre Nick, MD 10/06/16 1459

## 2016-10-05 DIAGNOSIS — D638 Anemia in other chronic diseases classified elsewhere: Secondary | ICD-10-CM

## 2016-10-05 DIAGNOSIS — N183 Chronic kidney disease, stage 3 (moderate): Secondary | ICD-10-CM

## 2016-10-05 DIAGNOSIS — R55 Syncope and collapse: Secondary | ICD-10-CM

## 2016-10-05 DIAGNOSIS — N3000 Acute cystitis without hematuria: Secondary | ICD-10-CM

## 2016-10-05 DIAGNOSIS — J9601 Acute respiratory failure with hypoxia: Secondary | ICD-10-CM

## 2016-10-05 DIAGNOSIS — B9620 Unspecified Escherichia coli [E. coli] as the cause of diseases classified elsewhere: Secondary | ICD-10-CM

## 2016-10-05 DIAGNOSIS — E038 Other specified hypothyroidism: Secondary | ICD-10-CM

## 2016-10-05 DIAGNOSIS — N390 Urinary tract infection, site not specified: Secondary | ICD-10-CM

## 2016-10-05 DIAGNOSIS — E876 Hypokalemia: Secondary | ICD-10-CM

## 2016-10-05 DIAGNOSIS — G301 Alzheimer's disease with late onset: Secondary | ICD-10-CM

## 2016-10-05 DIAGNOSIS — Y92009 Unspecified place in unspecified non-institutional (private) residence as the place of occurrence of the external cause: Secondary | ICD-10-CM

## 2016-10-05 DIAGNOSIS — D72829 Elevated white blood cell count, unspecified: Secondary | ICD-10-CM

## 2016-10-05 DIAGNOSIS — F0281 Dementia in other diseases classified elsewhere with behavioral disturbance: Secondary | ICD-10-CM

## 2016-10-05 DIAGNOSIS — W19XXXA Unspecified fall, initial encounter: Secondary | ICD-10-CM

## 2016-10-05 LAB — GLUCOSE, CAPILLARY: GLUCOSE-CAPILLARY: 98 mg/dL (ref 65–99)

## 2016-10-05 LAB — CBC
HEMATOCRIT: 30.3 % — AB (ref 36.0–46.0)
Hemoglobin: 9.8 g/dL — ABNORMAL LOW (ref 12.0–15.0)
MCH: 26.7 pg (ref 26.0–34.0)
MCHC: 32.3 g/dL (ref 30.0–36.0)
MCV: 82.6 fL (ref 78.0–100.0)
Platelets: 208 10*3/uL (ref 150–400)
RBC: 3.67 MIL/uL — AB (ref 3.87–5.11)
RDW: 16 % — ABNORMAL HIGH (ref 11.5–15.5)
WBC: 7.9 10*3/uL (ref 4.0–10.5)

## 2016-10-05 LAB — BASIC METABOLIC PANEL
ANION GAP: 10 (ref 5–15)
BUN: 25 mg/dL — ABNORMAL HIGH (ref 6–20)
CHLORIDE: 114 mmol/L — AB (ref 101–111)
CO2: 20 mmol/L — AB (ref 22–32)
Calcium: 8.5 mg/dL — ABNORMAL LOW (ref 8.9–10.3)
Creatinine, Ser: 1.57 mg/dL — ABNORMAL HIGH (ref 0.44–1.00)
GFR calc Af Amer: 36 mL/min — ABNORMAL LOW (ref >60)
GFR, EST NON AFRICAN AMERICAN: 31 mL/min — AB (ref >60)
GLUCOSE: 113 mg/dL — AB (ref 65–99)
POTASSIUM: 3.3 mmol/L — AB (ref 3.5–5.1)
Sodium: 144 mmol/L (ref 135–145)

## 2016-10-05 LAB — PROTIME-INR
INR: 3.59
Prothrombin Time: 36.7 seconds — ABNORMAL HIGH (ref 11.4–15.2)

## 2016-10-05 MED ORDER — HYDRALAZINE HCL 20 MG/ML IJ SOLN
10.0000 mg | Freq: Four times a day (QID) | INTRAMUSCULAR | Status: DC | PRN
  Filled 2016-10-05: qty 0.5

## 2016-10-05 MED ORDER — POTASSIUM CHLORIDE CRYS ER 20 MEQ PO TBCR
40.0000 meq | EXTENDED_RELEASE_TABLET | Freq: Two times a day (BID) | ORAL | Status: AC
  Administered 2016-10-05 (×2): 40 meq via ORAL
  Filled 2016-10-05 (×2): qty 2

## 2016-10-05 NOTE — Progress Notes (Signed)
PROGRESS NOTE    Norma Neal  YYQ:[id number] DOB: 05-05-1940 DOA: 10/04/2016 PCP: Janifer Adie, MD   Brief Narrative:  Norma Neal is a 76 y.o. 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 Pt apparently has history of recurrent falls especially over past 2 weeks as per family report. Her family was not present at bedside at this time to give more details of HPI. Pt felt dizzy prior to the fall but was not entirely sure if she has lost consciousness or not. Per family report in ED, pt apparently more confused since the fall. No chest pain, shortness of breath or palpitations. No abdominal pain, nausea or vomiting. No fevers, chills. No cough. No urinary complaints.In the ED CXR showed no acute cardiopulmonary findings. X rays of the left humerus and femur showed no acute fractures. 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 She was also found to have UTI and was started on Rocephin and it was an E Coli UTI. Patient is currently being hydrated and is improving. PT evaluated and recommended Home Health PT.   Assessment & Plan:   Principal Problem:   Fall at home, initial encounter Active Problems:   Anemia of chronic disease   Alzheimer's disease   Hypothyroidism   CKD (chronic kidney disease) stage 3, GFR 30-59 ml/min   Acute respiratory failure with hypoxia (HCC)   Leukocytosis   Hypokalemia   Acute lower UTI  Sepsis 2/2 to Acute Lower E Coli UTI -Patient met sepsis criteria with Leukocytosis, Tachycardia, Tachypnea and source of Infection -Sepsis Physiology is resolving  -WBC went from 10.8 -> 7.9 -Procalcitonin Level was 55.03 -Urinalysis showed a Cloudy Urine with Many Bacteria, Large Leukocytes, Negative Nitrites and TNTC WBC; -Urine Cx Showed >=100,000 COLONIES/mL ESCHERICHIA  COLI -Susceptibilities to follow -Blood Cx x2 Showed NGTD < 24 hours -Patient was started on Empiric IV Ceftriaxone and will continue -C/w NS at 50 mL/hr -Repeat CBC in AM  Acute Respiratory Failure with Hypoxia  -Had brief Episode where she desaturated to 79% -Stable respiratory status -Oxygen saturation now in mid 90's with [place] 2 L oxygen  -No acute cardiopulmonary findings on CXR -Continue with Continuous Pulse Oximetry and Supplemental O2 as Necessary -Maintain O2 Saturations > 92% and Weak O2 as tolerated  Recurrent Falls at Home, initial encounter -Likely in the setting of UTI -Obtained PT Eval and recommended Home Health PT -No acute fractures on x rays (left hip and left arm)  -No acute intracranial findings on CT head other than chronic ischemic changes and old infarcts  -Will obtain Orthostatic Vital Signs  ? Syncope -Likely related to UTI and Orthostasis  -Continue to Monitor on Telemetry -Head CT w/o Contrast showed atrophy with small vessel chronic ischemic changes of the deep cerebral white matter with multiple old infarcts. 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  -PT recommends Home Health PT -If happens again will get ECHO  Hypokalemia -Patient was Hypokalemic on Admission at 3.2 and then it was improved to 3.8 -This AM K+ was 3.3 -Replete with po KCl 40 meQ po BID x 2 -Continue to Monitor K+ and Replete as Necessary -Repeat CMP in AM   Anemia of Chronic Disease -Likely from CKD -Hb/Hct went from  10.0/30.0 -> 9.8/30.3 -Continue to Monitor for S/Sx of Bleeding -Repeat CBC with Diff in AM  Essential Hypertension -C/w Carvedilol 3.125 mg po BID and with Amlodipine 10 mg po Daily -Will add IV Hydralazine 10 mg q6hprn for SBP >180 or DBP >100  Alzheimer's Disease / Acute Metabolic Encephalopathy -Encephalopathy has  improved  -C/w Donepezil 10 mg po qHS and with Escitalopram 10 mg po Daily -C/w Mirtazapine 30 mg po qHS  Hypothyroidism -Check TSH and Free T4 -C/w Levothyroxine 112 mcg  CKD (chronic kidney disease) stage 3, GFR 50-51 ml/min / Metabolic acidosis  -Cr in 2013 was as high as 2.3  -Cr on this admission is within baseline range -BUN/Cr improved from 38/2.28 on Admission to 25/1.57 -Continue Sodium Bicarbonate 650 mg po BID  History of DVT -INR was Supratherapeutic at 3.82 on admission and repeat this AM was 3.59 -Coumadin on hold due to supratherapeutic INR -Pharmacy Following and Appreciate Coumadin Dosing  -Repeat PT-INR in AM  Low Tension Open-Angle Glaucoma -C/w Latanoprost 1 drop Both Eyes Daily qHS and with Brimonidine 0.15% Opthalmic Solution 1 drop both Eyes qHS   GERD/Esophageal Reflux -C/w Famotidine 20 mg po qHS and with Pantoprazole 40 mg po Daily  Hx of Chronic Obstructive Asthma, Unspecified -C/w Albuterol Nebs 2.5 mg q6hprn for wheezing and SOB  DVT prophylaxis: Anticoagulated with Coumadin (Currently held due to supratherapeutic INR) Code Status: FULL CODE Family Communication: No family present at bedside Disposition Plan: Home with Home Health PT at D/C when medically stable  Consultants:   None   Procedures: None  Antimicrobials: Anti-infectives    Start     Dose/Rate Route Frequency Ordered Stop   10/05/16 1400  cefTRIAXone (ROCEPHIN) 1 g in dextrose 5 % 50 mL IVPB     1 g 100 mL/hr over 30 Minutes Intravenous Every 24 hours 10/04/16 1415     10/04/16 1430  cefTRIAXone (ROCEPHIN) 1 g in dextrose 5 % 50 mL IVPB  Status:  Discontinued     1 g 100 mL/hr over 30 Minutes Intravenous Every 24 hours 10/04/16 1415 10/04/16 1415   10/04/16 1345  cefTRIAXone (ROCEPHIN) 1 g in dextrose 5 % 50 mL IVPB     1 g 100 mL/hr over 30 Minutes Intravenous  Once 10/04/16 1330 10/04/16 1557     Subjective: Seen and examined and was feeling slightly better. Denied  any SOB but stated she was having increased urinary frequency and burning. No CP or SOB. Still feels slightly weak.   Objective: Vitals:   10/04/16 1739 10/04/16 1806 10/04/16 2310 10/05/16 0442  BP: (!) 155/101 (!) 143/87 (!) 120/53 127/61  Pulse: (!) 108 (!) 112 91 95  Resp: (!) 29 18 18 18   Temp:  100 F (37.8 C) 99.5 F (37.5 C) 99 F (37.2 C)  TempSrc:  Oral Oral Oral  SpO2: 96% 93% 94% 96%  Weight:    60.3 kg (132 lb 15 oz)  Height:        Intake/Output Summary (Last 24 hours) at 10/05/16 0804 Last data filed at 10/05/16 0200  Gross per 24 hour  Intake            617.5 ml  Output                0 ml  Net            617.5 ml   Filed Weights   10/04/16 1001 10/05/16 0442  Weight: 59 kg (130 lb) 60.3 kg (  132 lb 15 oz)   Examination: Physical Exam:  Constitutional:  NAD and appears calm and comfortable Eyes:  Lids and conjunctivae normal, sclerae anicteric  ENMT: External Ears, Nose appear normal. Grossly normal hearing.  Neck: Appears normal, supple, no cervical masses, normal ROM, no appreciable thyromegaly, no JVD Respiratory: Diminished to auscultation bilaterally, no wheezing, rales, rhonchi or crackles. Normal respiratory effort and patient is not tachypenic. No accessory muscle use.  Cardiovascular: RRR, no murmurs / rubs / gallops. S1 and S2 auscultated. Very mild extremity edema worse on Left than Right. Abdomen: Soft, non-tender, non-distended. No masses palpated. No appreciable hepatosplenomegaly. Bowel sounds positive.  GU: Deferred. Musculoskeletal: No clubbing / cyanosis of digits/nails. No joint deformity upper and lower extremities.  Skin: No rashes, lesions, ulcers on limited skin evaluation. No induration; Warm and dry.  Neurologic: CN 2-12 grossly intact with no focal deficits. Romberg sign cerebellar reflexes not assessed.  Psychiatric: Normal judgment and insight. Alert and awake. Normal mood but had a flat affect.   Data Reviewed: I have personally  reviewed following labs and imaging studies  CBC:  Recent Labs Lab 10/04/16 1207 10/04/16 1812 10/05/16 0519  WBC 10.8* 10.2 7.9  NEUTROABS 9.8* 9.1*  --   HGB 9.8* 10.0* 9.8*  HCT 30.2* 30.0* 30.3*  MCV 82.1 82.2 82.6  PLT 232 180 161   Basic Metabolic Panel:  Recent Labs Lab 10/04/16 1207 10/04/16 1812 10/05/16 0519  NA 143 144 144  K 3.2* 3.8 3.3*  CL 113* 114* 114*  CO2 18* 18* 20*  GLUCOSE 122* 110* 113*  BUN 38* 32* 25*  CREATININE 2.28* 1.96* 1.57*  CALCIUM 8.4* 8.2* 8.5*  MG  --  2.0  --   PHOS  --  2.4*  --    GFR: Estimated Creatinine Clearance: 25.8 mL/min (A) (by C-G formula based on SCr of 1.57 mg/dL (H)). Liver Function Tests:  Recent Labs Lab 10/04/16 1207 10/04/16 1812  AST 167* 207*  ALT 49 62*  ALKPHOS 77 77  BILITOT 0.6 0.5  PROT 7.1 6.9  ALBUMIN 3.2* 3.0*   No results for input(s): LIPASE, AMYLASE in the last 168 hours. No results for input(s): AMMONIA in the last 168 hours. Coagulation Profile:  Recent Labs Lab 10/04/16 1207 10/05/16 0519  INR 3.82 3.59   Cardiac Enzymes: No results for input(s): CKTOTAL, CKMB, CKMBINDEX, TROPONINI in the last 168 hours. BNP (last 3 results) No results for input(s): PROBNP in the last 8760 hours. HbA1C: No results for input(s): HGBA1C in the last 72 hours. CBG: No results for input(s): GLUCAP in the last 168 hours. Lipid Profile: No results for input(s): CHOL, HDL, LDLCALC, TRIG, CHOLHDL, LDLDIRECT in the last 72 hours. Thyroid Function Tests: No results for input(s): TSH, T4TOTAL, FREET4, T3FREE, THYROIDAB in the last 72 hours. Anemia Panel: No results for input(s): VITAMINB12, FOLATE, FERRITIN, TIBC, IRON, RETICCTPCT in the last 72 hours. Sepsis Labs:  Recent Labs Lab 10/04/16 1812 10/04/16 2021  PROCALCITON 55.03  --   LATICACIDVEN 1.6 1.1    No results found for this or any previous visit (from the past 240 hour(s)).   Radiology Studies: Dg Chest 2 View  Result Date:  10/04/2016 CLINICAL DATA:  Trauma secondary to a fall at home today. Multiple recent falls. EXAM: CHEST  2 VIEW COMPARISON:  Chest x-ray dated 08/20/2016 and 01/18/2015 FINDINGS: There is chronic deviation of the trachea to the left at the level of the thoracic inlet consistent with an enlarged right lobe  of the thyroid gland. This is essentially unchanged since the prior exam. The deviation has increased slightly since 01/18/2015. Overall heart size is normal. Pulmonary vascularity is normal. The lungs are clear. Chronic deformity of the upper bony thorax, unchanged. Chronic degenerative changes of both shoulders. No acute bone abnormality. IMPRESSION: No active cardiopulmonary disease. Electronically Signed   By: Lorriane Shire M.D.   On: 10/04/2016 11:01   Ct Head Wo Contrast  Result Date: 10/04/2016 CLINICAL DATA:  Dementia, fell in bathroom today, earlier this week fell down stairs, no loss of consciousness, recurrent falls, dizziness, hypertension, diabetes mellitus, Alzheimer's EXAM: CT HEAD WITHOUT CONTRAST TECHNIQUE: Contiguous axial images were obtained from the base of the skull through the vertex without intravenous contrast. Sagittal and coronal MPR images reconstructed from axial data set. COMPARISON:  10/02/2016 FINDINGS: Brain: Mild generalized atrophy. Normal ventricular morphology. No midline shift or mass effect. Small vessel chronic ischemic changes of deep cerebral white matter. Multiple old infarcts including BILATERAL occipital lobes, RIGHT frontal lobe, RIGHT temporal lobe, and high LEFT frontal lobe. No new areas of infarction or intracranial hemorrhage. No extra-axial fluid collections. Calcified nodule LEFT parafalcine at frontal region could represent a dural calcification or a calcified meningioma 11 mm diameter, unchanged. Vascular: Unremarkable Skull: Demineralized but intact Sinuses/Orbits: Clear Other: N/A IMPRESSION: Atrophy with small vessel chronic ischemic changes of deep  cerebral white matter. Multiple old infarcts. No acute intracranial abnormalities. Prominent dural calcification versus 11 mm calcified meningioma in the LEFT frontal parafalcine region unchanged. Electronically Signed   By: Lavonia Dana M.D.   On: 10/04/2016 11:08   Dg Humerus Left  Result Date: 10/04/2016 CLINICAL DATA:  Left arm pain secondary to a fall in the bathroom at home today. EXAM: LEFT HUMERUS - 2+ VIEW COMPARISON:  Shoulder radiographs dated 10/03/2014 FINDINGS: There is no fracture or dislocation. There is moderate arthritis of the glenohumeral joint. No appreciable joint effusions. IMPRESSION: No acute abnormality.  Moderate arthritis of the glenohumeral joint. Electronically Signed   By: Lorriane Shire M.D.   On: 10/04/2016 11:05   Dg Hip Unilat W Or W/o Pelvis 2-3 Views Left  Result Date: 10/04/2016 CLINICAL DATA:  Left hip pain secondary to a fall at home in the bathroom today. EXAM: DG HIP (WITH OR WITHOUT PELVIS) 2-3V LEFT COMPARISON:  None. FINDINGS: There is slight arthritic changes of the left hip. There is no fracture or dislocation. There are low similar but less prominent arthritic changes of the right hip. Pelvic bones are intact. Degenerative facet arthritis in the lower lumbar spine. IMPRESSION: No acute abnormalities. Electronically Signed   By: Lorriane Shire M.D.   On: 10/04/2016 11:04   Dg Femur Min 2 Views Left  Result Date: 10/04/2016 CLINICAL DATA:  Left hip and leg pain secondary to a fall today. EXAM: LEFT FEMUR 2 VIEWS COMPARISON:  None. FINDINGS: There is slight medial joint space narrowing of the left hip. Slight marginal osteophyte formation on the femoral head and acetabulum. No fracture or dislocation. No appreciable joint effusions. Slight degenerative changes of the knee joint. IMPRESSION: No acute abnormality of the left femur. Slight arthritic changes at the knee and hip. Electronically Signed   By: Lorriane Shire M.D.   On: 10/04/2016 11:02   Scheduled  Meds: . amLODipine  10 mg Oral Daily  . aspirin EC  81 mg Oral Daily  . brimonidine  1 drop Both Eyes QHS  . carvedilol  3.125 mg Oral BID WC  . chlorhexidine  15 mL Mouth Rinse BID  . cholecalciferol  1,000 Units Oral Daily  . donepezil  10 mg Oral QHS  . escitalopram  10 mg Oral Daily  . famotidine  20 mg Oral QHS  . gabapentin  300 mg Oral QHS  . latanoprost  1 drop Both Eyes QHS  . levothyroxine  112 mcg Oral QAC breakfast  . loratadine  10 mg Oral Daily  . mouth rinse  15 mL Mouth Rinse q12n4p  . mirtazapine  30 mg Oral QHS  . pantoprazole  40 mg Oral Daily  . potassium chloride  40 mEq Oral BID  . sodium bicarbonate  650 mg Oral BID  . sodium chloride flush  3 mL Intravenous Q12H   Continuous Infusions: . sodium chloride 50 mL/hr at 10/04/16 1827  . cefTRIAXone (ROCEPHIN)  IV      LOS: 1 day   Kerney Elbe, DO [facility] Pager [phone]  If 7PM-7AM, please contact night-coverage [email or web address] Password [email or web address] 10/05/2016, 8:04 AM

## 2016-10-05 NOTE — Evaluation (Signed)
Physical Therapy Evaluation Patient Details Name: Norma Neal MRN: [id number] DOB: 1940/12/25 Today's Date: 10/05/2016   History of Present Illness   76 y.o. 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 Dx UTI, respiratory failure.   Clinical Impression  Pt admitted with above diagnosis. Pt currently with functional limitations due to the deficits listed below (see PT Problem List). Pt ambulated 120' with RW, no loss of balance, distance limited by fatigue. Good progress expected.  Pt will benefit from skilled PT to increase their independence and safety with mobility to allow discharge to the venue listed below.       Follow Up Recommendations Home health PT    Equipment Recommendations  None recommended by PT    Recommendations for Other Services       Precautions / Restrictions Precautions Precautions: Fall Precaution Comments: 2 falls in past 1 week Restrictions Weight Bearing Restrictions: No      Mobility  Bed Mobility Overal bed mobility: Needs Assistance Bed Mobility: Supine to Sit     Supine to sit: Min guard     General bed mobility comments: min A to scoot to EOB, used rail, increased time  Transfers Overall transfer level: Needs assistance Equipment used: Rolling walker (2 wheeled) Transfers: Sit to/from Stand Sit to Stand: Min assist         General transfer comment: VCs hand placement, increased time/effort, min A to rise  Ambulation/Gait Ambulation/Gait assistance: Min guard Ambulation Distance (Feet): 120 Feet Assistive device: Rolling walker (2 wheeled) Gait Pattern/deviations: Decreased step length - right;Decreased step length - left   Gait velocity interpretation: Below normal speed for age/gender General Gait Details: steady, no LOB  Stairs            Wheelchair Mobility    Modified Rankin (Stroke Patients  Only)       Balance Overall balance assessment: History of Falls                                           Pertinent Vitals/Pain Pain Assessment: 0-10 Pain Score: 8  Pain Location: L thigh Pain Descriptors / Indicators: Sore Pain Intervention(s): Limited activity within patient's tolerance;Patient requesting pain meds-RN notified;Monitored during session    Home Living Family/patient expects to be discharged to:: Private residence Living Arrangements: Children Available Help at Discharge: Family;Available PRN/intermittently Type of Home: House Home Access: Stairs to enter     Home Layout: Two level;Bed/bath upstairs Home Equipment: Walker - 2 wheels;Walker - 4 wheels;Wheelchair - manual      Prior Function Level of Independence: Independent with assistive device(s)         Comments: uses RW when going out, no AD in home, independent ADLs, attends day care program, lives with son     Hand Dominance   Dominant Hand: Right    Extremity/Trunk Assessment   Upper Extremity Assessment Upper Extremity Assessment: Overall WFL for tasks assessed    Lower Extremity Assessment Lower Extremity Assessment: Overall WFL for tasks assessed    Cervical / Trunk Assessment Cervical / Trunk Assessment: Normal  Communication   Communication: No difficulties  Cognition Arousal/Alertness: Awake/alert Behavior During Therapy: WFL for tasks assessed/performed Overall Cognitive Status: Within Functional Limits for tasks assessed  General Comments      Exercises     Assessment/Plan    PT Assessment Patient needs continued PT services  PT Problem List Decreased mobility;Decreased activity tolerance;Pain       PT Treatment Interventions DME instruction;Gait training;Functional mobility training;Stair training;Therapeutic exercise;Therapeutic activities;Patient/family education    PT Goals (Current  goals can be found in the Care Plan section)  Acute Rehab PT Goals Patient Stated Goal: likes to do art at day program PT Goal Formulation: With patient Time For Goal Achievement: 10/19/16 Potential to Achieve Goals: Good    Frequency Min 3X/week   Barriers to discharge        Co-evaluation               AM-PAC PT "6 Clicks" Daily Activity  Outcome Measure Difficulty turning over in bed (including adjusting bedclothes, sheets and blankets)?: A Little Difficulty moving from lying on back to sitting on the side of the bed? : A Little Difficulty sitting down on and standing up from a chair with arms (e.g., wheelchair, bedside commode, etc,.)?: A Little Help needed moving to and from a bed to chair (including a wheelchair)?: A Little Help needed walking in hospital room?: A Little Help needed climbing 3-5 steps with a railing? : A Little 6 Click Score: 18    End of Session Equipment Utilized During Treatment: Gait belt Activity Tolerance: Patient limited by pain Patient left: in chair;with call bell/phone within reach;with chair alarm set Nurse Communication: Mobility status PT Visit Diagnosis: History of falling (Z91.81);Difficulty in walking, not elsewhere classified (R26.2)    Time: [id number] PT Time Calculation (min) (ACUTE ONLY): 24 min   Charges:   PT Evaluation $PT Eval Low Complexity: 1 Procedure PT Treatments $Gait Training: 8-22 mins   PT G Codes:          Tamala SerUhlenberg, Deniyah Dillavou Kistler 10/05/2016, 9:52 AM [phone]

## 2016-10-05 NOTE — Progress Notes (Signed)
Pharmacy - Warfarin  Assessment:  Pharmacy consulted to continue warfarin in this [age] yoF admitted 6/8 for recurrent falls and confusion. PMH DVT HTN, hypothyroid, Alzheimer's, CKD3.  Home warfarin regimen 5 mg daily, last dose 6/7  10/05/2016  INR still supratherapeutic at 3.59  H/H low but stable  Plts WNL  Scr improved to 1.57  Plan:  Hold warfarin  Daily INR  Pharmacy will resume when appropriate  May want to consider risks vs benefits of continuing warfarin in patient with recurrent falls (unless d/t an acute modifiable cause)  Arley PhenixEllen Martavius Lusty RPh 10/05/2016, 9:05 AM Pager [phone]

## 2016-10-06 DIAGNOSIS — A4151 Sepsis due to Escherichia coli [E. coli]: Secondary | ICD-10-CM

## 2016-10-06 DIAGNOSIS — R945 Abnormal results of liver function studies: Secondary | ICD-10-CM

## 2016-10-06 LAB — COMPREHENSIVE METABOLIC PANEL
ALBUMIN: 2.9 g/dL — AB (ref 3.5–5.0)
ALK PHOS: 79 U/L (ref 38–126)
ALT: 85 U/L — ABNORMAL HIGH (ref 14–54)
AST: 172 U/L — AB (ref 15–41)
Anion gap: 11 (ref 5–15)
BILIRUBIN TOTAL: 0.3 mg/dL (ref 0.3–1.2)
BUN: 16 mg/dL (ref 6–20)
CO2: 17 mmol/L — AB (ref 22–32)
Calcium: 8.9 mg/dL (ref 8.9–10.3)
Chloride: 118 mmol/L — ABNORMAL HIGH (ref 101–111)
Creatinine, Ser: 1.24 mg/dL — ABNORMAL HIGH (ref 0.44–1.00)
GFR calc Af Amer: 48 mL/min — ABNORMAL LOW (ref >60)
GFR calc non Af Amer: 41 mL/min — ABNORMAL LOW (ref >60)
GLUCOSE: 111 mg/dL — AB (ref 65–99)
POTASSIUM: 4.2 mmol/L (ref 3.5–5.1)
SODIUM: 146 mmol/L — AB (ref 135–145)
TOTAL PROTEIN: 6.7 g/dL (ref 6.5–8.1)

## 2016-10-06 LAB — CBC WITH DIFFERENTIAL/PLATELET
BASOS ABS: 0 10*3/uL (ref 0.0–0.1)
BASOS PCT: 0 %
EOS ABS: 0.3 10*3/uL (ref 0.0–0.7)
Eosinophils Relative: 3 %
HEMATOCRIT: 28.6 % — AB (ref 36.0–46.0)
HEMOGLOBIN: 9.3 g/dL — AB (ref 12.0–15.0)
Lymphocytes Relative: 9 %
Lymphs Abs: 0.8 10*3/uL (ref 0.7–4.0)
MCH: 26.8 pg (ref 26.0–34.0)
MCHC: 32.5 g/dL (ref 30.0–36.0)
MCV: 82.4 fL (ref 78.0–100.0)
Monocytes Absolute: 0.6 10*3/uL (ref 0.1–1.0)
Monocytes Relative: 7 %
NEUTROS ABS: 6.8 10*3/uL (ref 1.7–7.7)
Neutrophils Relative %: 81 %
Platelets: 235 10*3/uL (ref 150–400)
RBC: 3.47 MIL/uL — ABNORMAL LOW (ref 3.87–5.11)
RDW: 16.4 % — AB (ref 11.5–15.5)
WBC: 8.4 10*3/uL (ref 4.0–10.5)

## 2016-10-06 LAB — URINE CULTURE

## 2016-10-06 LAB — GLUCOSE, CAPILLARY: GLUCOSE-CAPILLARY: 94 mg/dL (ref 65–99)

## 2016-10-06 LAB — MAGNESIUM: Magnesium: 1.9 mg/dL (ref 1.7–2.4)

## 2016-10-06 LAB — PHOSPHORUS: Phosphorus: 2.1 mg/dL — ABNORMAL LOW (ref 2.5–4.6)

## 2016-10-06 LAB — PROTIME-INR
INR: 2.68
PROTHROMBIN TIME: 29 s — AB (ref 11.4–15.2)

## 2016-10-06 MED ORDER — WARFARIN - PHARMACIST DOSING INPATIENT
Freq: Every day | Status: DC
  Administered 2016-10-06: 18:00:00

## 2016-10-06 MED ORDER — WARFARIN SODIUM 2.5 MG PO TABS
2.5000 mg | ORAL_TABLET | Freq: Once | ORAL | Status: AC
  Administered 2016-10-06: 2.5 mg via ORAL
  Filled 2016-10-06: qty 1

## 2016-10-06 MED ORDER — DEXTROSE 5 % IV SOLN
INTRAVENOUS | Status: DC
  Administered 2016-10-06 – 2016-10-07 (×2): via INTRAVENOUS

## 2016-10-06 NOTE — Progress Notes (Signed)
ANTICOAGULATION CONSULT NOTE - Follow Up Consult  Pharmacy Consult for warfarin Indication:PMH DVT  No Known Allergies  Patient Measurements: Height: 5\' 1"  (154.9 cm) Weight: 139 lb 5.3 oz (63.2 kg) IBW/kg (Calculated) : 47.8   Vital Signs: Temp: 98.9 F (37.2 C) (06/10 0457) Temp Source: Oral (06/10 0457) BP: 154/77 (06/10 0457) Pulse Rate: 93 (06/10 0457)  Labs:  Recent Labs  10/04/16 1207 10/04/16 1812 10/05/16 0519 10/06/16 0511  HGB 9.8* 10.0* 9.8* 9.3*  HCT 30.2* 30.0* 30.3* 28.6*  PLT 232 180 208 235  LABPROT 38.5*  --  36.7* 29.0*  INR 3.82  --  3.59 2.68  CREATININE 2.28* 1.96* 1.57* 1.24*    Estimated Creatinine Clearance: 33.4 mL/min (A) (by C-G formula based on SCr of 1.24 mg/dL (H)).    Assessment: Pharmacy consulted to continue warfarin in this 89 yoF admitted 6/8 for recurrent falls and confusion. PMH DVT HTN, hypothyroid, Alzheimer's, CKD3.  Home warfarin regimen 5 mg daily, last dose 6/7  10/06/2016 INR 2.68 therapeutic  Scr 1.24, CrCl ~ 33 (improving) H/H low but stable Plts WNL DI: none, also takes daily ASA 81mg  Only one meal documented yesterday  Goal of Therapy:  INR 2-3   Plan:  Warfarin 2.5mg  po x 1 at 1800 Daily INR May want to consider risks vs benefits of continuing warfarin in patient with recurrent falls (unless d/t an acute modifiable cause)   Arley PhenixEllen Keymani Neal RPh 10/06/2016, 9:18 AM Pager [phone]

## 2016-10-06 NOTE — Progress Notes (Signed)
PROGRESS NOTE    Norma Neal  TIW:[id number] DOB: Jul 01, 1940 DOA: 10/04/2016 PCP: Janifer Adie, MD   Brief Narrative:  Norma Neal is a 76 y.o. 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 Pt apparently has history of recurrent falls especially over past 2 weeks as per family report. Her family was not present at bedside at this time to give more details of HPI. Pt felt dizzy prior to the fall but was not entirely sure if she has lost consciousness or not. Per family report in ED, pt apparently more confused since the fall. No chest pain, shortness of breath or palpitations. No abdominal pain, nausea or vomiting. No fevers, chills. No cough. No urinary complaints.In the ED CXR showed no acute cardiopulmonary findings. X rays of the left humerus and femur showed no acute fractures. 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 She was also found to have UTI and was started on Rocephin and it was an E Coli UTI. Patient is currently being hydrated and is improving. PT evaluated and recommended Home Health PT. Patient states she is feeling a little better but complained of medial thigh pain.   Assessment & Plan:   Principal Problem:   Fall at home, initial encounter Active Problems:   Anemia of chronic disease   Alzheimer's disease   Hypothyroidism   CKD (chronic kidney disease) stage 3, GFR 30-59 ml/min   Acute respiratory failure with hypoxia (HCC)   Leukocytosis   Hypokalemia   Acute lower UTI  Sepsis 2/2 to Acute Lower E Coli UTI -Patient met sepsis criteria with Leukocytosis, Tachycardia, Tachypnea and source of Infection -Sepsis Physiology is resolving  -WBC went from 10.8 -> 7.9 -> 8.4 -Procalcitonin Level was 55.03 -Urinalysis showed a Cloudy Urine with Many Bacteria, Large Leukocytes, Negative  Nitrites and TNTC WBC; -Urine Cx Showed >=100,000 COLONIES/mL ESCHERICHIA COLI -UTI was Pan-sensitive  -Blood Cx x2 Showed NGTD at 2 days -Patient was started on Empiric IV Ceftriaxone -Changed NS at 50 mL/hr to D5W at 50 mL/hr -Repeat CBC in AM  Acute Respiratory Failure with Hypoxia  -Had brief Episode where she desaturated to 79% -Stable respiratory status -Oxygen saturation now in mid 90's with [place] 2 L oxygen  -No acute cardiopulmonary findings on CXR -Continue with Continuous Pulse Oximetry and Supplemental O2 as Necessary -Maintain O2 Saturations > 92% and Weak O2 as tolerated  Recurrent Falls at Home, initial encounter -Likely in the setting of UTI -Obtained PT Eval and recommended Home Health PT -No acute fractures on x rays (left hip and left arm)  -No acute intracranial findings on CT head other than 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 Syncope -Likely related to UTI and Orthostasis  -Continue to Monitor on Telemetry -Head CT w/o Contrast showed atrophy with small vessel chronic ischemic changes of the deep cerebral white matter with multiple old infarcts. 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  -Patient's Orthostatics still not fully complete  -PT recommends Home Health PT -If happens again will get ECHO  Hypokalemia -Patient was Hypokalemic on Admission at 3.2 and then it was improved to 3.8 -This AM K+ was  4.2 -Continue to Monitor K+ and Replete as Necessary -Repeat CMP in AM   HyperNa+/Hypercholremia -Patient's Sodium was 146 and Chloride was 118 -Changed IVF to D5W -Repeat CMP in AM   Anemia of Chronic Disease -Likely from CKD -Hb/Hct went from 10.0/30.0 -> 9.8/30.3 -> 9.3/28.6 -Continue to Monitor for S/Sx of Bleeding -Repeat CBC with  Diff in AM  Essential Hypertension -C/w Carvedilol 3.125 mg po BID and with Amlodipine 10 mg po Daily -Will add IV Hydralazine 10 mg q6hprn for SBP >180 or DBP >100  Alzheimer's Disease / Acute Metabolic Encephalopathy -Encephalopathy has improved  -C/w Donepezil 10 mg po qHS and with Escitalopram 10 mg po Daily -C/w Mirtazapine 30 mg po qHS  Hypothyroidism -Check TSH and Free T4 -C/w Levothyroxine 112 mcg  CKD (chronic kidney disease) stage 3, GFR 63-14 ml/min / Metabolic acidosis  -Cr in 2013 was as high as 2.3  -Cr on this admission is within baseline range -BUN/Cr improved from 38/2.28 on Admission to 25/1.57 -Continue Sodium Bicarbonate 650 mg po BID  History of DVT -INR was Supratherapeutic at 3.82 on admission and repeat this AM was 2.68 -Pharmacy Following and Appreciate Coumadin Dosing  -Will get 1 dose of 2.5 mg po tonight  -Repeat PT-INR in AM  Low Tension Open-Angle Glaucoma -C/w Latanoprost 1 drop Both Eyes Daily qHS and with Brimonidine 0.15% Opthalmic Solution 1 drop both Eyes qHS   GERD/Esophageal Reflux -C/w Famotidine 20 mg po qHS and with Pantoprazole 40 mg po Daily  Hx of Chronic Obstructive Asthma, Unspecified -C/w Albuterol Nebs 2.5 mg q6hprn for wheezing and SOB  Abnormal LFT's/Transaminitis -Unclear Etiology -AST went from 167 -> 207 -> 172 -ALT went from 59 -> 62 -> 85 -Will check Acute Hepatitis Panel and RUQ U/S -Repeat CMP in AM  DVT prophylaxis: Anticoagulated with Coumadin  Code Status: FULL CODE Family Communication: No family present at bedside Disposition Plan: Home with Home Health PT at D/C when medically stable  Consultants:   None   Procedures: None  Antimicrobials: Anti-infectives    Start     Dose/Rate Route Frequency Ordered Stop   10/05/16 1400  cefTRIAXone (ROCEPHIN) 1 g in dextrose 5 % 50 mL IVPB     1 g 100 mL/hr over 30 Minutes Intravenous Every 24 hours 10/04/16 1415     10/04/16 1430  cefTRIAXone  (ROCEPHIN) 1 g in dextrose 5 % 50 mL IVPB  Status:  Discontinued     1 g 100 mL/hr over 30 Minutes Intravenous Every 24 hours 10/04/16 1415 10/04/16 1415   10/04/16 1345  cefTRIAXone (ROCEPHIN) 1 g in dextrose 5 % 50 mL IVPB     1 g 100 mL/hr over 30 Minutes Intravenous  Once 10/04/16 1330 10/04/16 1557     Subjective: Seen and examined and was feeling slightly better. Complained of medial Left thigh pain. Denied any SOB or CP. No other concerns or complaints at this time.    Objective: Vitals:   10/06/16 0424 10/06/16 0457 10/06/16 1106 10/06/16 1500  BP:  (!) 154/77  132/60  Pulse:  93  83  Resp:  20  20  Temp:  98.9 F (37.2 C)  98.2 F (36.8 C)  TempSrc:  Oral  Oral  SpO2:  100% 100% 100%  Weight: 63.2 kg (139 lb 5.3 oz)     Height:        Intake/Output Summary (Last 24 hours) at 10/06/16 1932 Last data filed at 10/06/16 1800  Gross per 24  hour  Intake          2074.17 ml  Output              200 ml  Net          1874.17 ml   Filed Weights   10/04/16 1001 10/05/16 0442 10/06/16 0424  Weight: 59 kg (130 lb) 60.3 kg (132 lb 15 oz) 63.2 kg (139 lb 5.3 oz)   Examination: Physical Exam:  Constitutional:  76 yo AAF in NAD. Appears calm and comfortable and only complaining of medial thigh pain.  Eyes:  Sclerae Anicteric; Conjuntivae Non-injected ENMT: Grossly normal hearing. External Ears appear normal Neck: Supple with no JVD Respiratory: CTAB. No wheezing/rales/rhonchi Cardiovascular: RRR. S1 S2. Very mild lower extremity edema Abdomen: Soft, NT, ND. Bowel Sounds present GU: Deferred Musculoskeletal: No cyanosis. No contractures Skin: Warm and Dry. No rashes or lesions on a limited skin evaluation Neurologic: CN 2-12 grossly intact  Psychiatric: Normal mood and affect. Intact Judgement and insight  Data Reviewed: I have personally reviewed following labs and imaging studies  CBC:  Recent Labs Lab 10/04/16 1207 10/04/16 1812 10/05/16 0519 10/06/16 0511    WBC 10.8* 10.2 7.9 8.4  NEUTROABS 9.8* 9.1*  --  6.8  HGB 9.8* 10.0* 9.8* 9.3*  HCT 30.2* 30.0* 30.3* 28.6*  MCV 82.1 82.2 82.6 82.4  PLT 232 180 208 195   Basic Metabolic Panel:  Recent Labs Lab 10/04/16 1207 10/04/16 1812 10/05/16 0519 10/06/16 0511  NA 143 144 144 146*  K 3.2* 3.8 3.3* 4.2  CL 113* 114* 114* 118*  CO2 18* 18* 20* 17*  GLUCOSE 122* 110* 113* 111*  BUN 38* 32* 25* 16  CREATININE 2.28* 1.96* 1.57* 1.24*  CALCIUM 8.4* 8.2* 8.5* 8.9  MG  --  2.0  --  1.9  PHOS  --  2.4*  --  2.1*   GFR: Estimated Creatinine Clearance: 33.4 mL/min (A) (by C-G formula based on SCr of 1.24 mg/dL (H)). Liver Function Tests:  Recent Labs Lab 10/04/16 1207 10/04/16 1812 10/06/16 0511  AST 167* 207* 172*  ALT 49 62* 85*  ALKPHOS 77 77 79  BILITOT 0.6 0.5 0.3  PROT 7.1 6.9 6.7  ALBUMIN 3.2* 3.0* 2.9*   No results for input(s): LIPASE, AMYLASE in the last 168 hours. No results for input(s): AMMONIA in the last 168 hours. Coagulation Profile:  Recent Labs Lab 10/04/16 1207 10/05/16 0519 10/06/16 0511  INR 3.82 3.59 2.68   Cardiac Enzymes: No results for input(s): CKTOTAL, CKMB, CKMBINDEX, TROPONINI in the last 168 hours. BNP (last 3 results) No results for input(s): PROBNP in the last 8760 hours. HbA1C: No results for input(s): HGBA1C in the last 72 hours. CBG:  Recent Labs Lab 10/05/16 0824 10/06/16 0753  GLUCAP 98 94   Lipid Profile: No results for input(s): CHOL, HDL, LDLCALC, TRIG, CHOLHDL, LDLDIRECT in the last 72 hours. Thyroid Function Tests: No results for input(s): TSH, T4TOTAL, FREET4, T3FREE, THYROIDAB in the last 72 hours. Anemia Panel: No results for input(s): VITAMINB12, FOLATE, FERRITIN, TIBC, IRON, RETICCTPCT in the last 72 hours. Sepsis Labs:  Recent Labs Lab 10/04/16 1812 10/04/16 2021  PROCALCITON 55.03  --   LATICACIDVEN 1.6 1.1    Recent Results (from the past 240 hour(s))  Urine culture     Status: Abnormal   Collection  Time: 10/04/16 10:23 AM  Result Value Ref Range Status   Specimen Description URINE, RANDOM  Final   Special Requests NONE  Final   Culture >=100,000 COLONIES/mL ESCHERICHIA COLI (A)  Final   Report Status 10/06/2016 FINAL  Final   Organism ID, Bacteria ESCHERICHIA COLI (A)  Final      Susceptibility   Escherichia coli - MIC*    AMPICILLIN <=2 SENSITIVE Sensitive     CEFAZOLIN <=4 SENSITIVE Sensitive     CEFTRIAXONE <=1 SENSITIVE Sensitive     CIPROFLOXACIN <=0.25 SENSITIVE Sensitive     GENTAMICIN <=1 SENSITIVE Sensitive     IMIPENEM <=0.25 SENSITIVE Sensitive     NITROFURANTOIN <=16 SENSITIVE Sensitive     TRIMETH/SULFA <=20 SENSITIVE Sensitive     AMPICILLIN/SULBACTAM <=2 SENSITIVE Sensitive     PIP/TAZO <=4 SENSITIVE Sensitive     Extended ESBL NEGATIVE Sensitive     * >=100,000 COLONIES/mL ESCHERICHIA COLI  Culture, blood (x 2)     Status: None (Preliminary result)   Collection Time: 10/04/16  6:12 PM  Result Value Ref Range Status   Specimen Description BLOOD LEFT ARM  Final   Special Requests IN PEDIATRIC BOTTLE Blood Culture adequate volume  Final   Culture   Final    NO GROWTH 2 DAYS Performed at [facility], [address] [place], [place] [address]    Report Status PENDING  Incomplete  Culture, blood (x 2)     Status: None (Preliminary result)   Collection Time: 10/04/16  8:21 PM  Result Value Ref Range Status   Specimen Description BLOOD LEFT ARM  Final   Special Requests IN PEDIATRIC BOTTLE Blood Culture adequate volume  Final   Culture   Final    NO GROWTH 2 DAYS Performed at [facility], [place] [address], [place], [place] [address]    Report Status PENDING  Incomplete     Radiology Studies: No results found. Scheduled Meds: . amLODipine  10 mg Oral Daily  . aspirin EC  81 mg Oral Daily  . brimonidine  1 drop Both Eyes QHS  . carvedilol  3.125 mg Oral BID WC  . chlorhexidine  15 mL Mouth Rinse BID  . cholecalciferol  1,000 Units Oral Daily   . donepezil  10 mg Oral QHS  . escitalopram  10 mg Oral Daily  . famotidine  20 mg Oral QHS  . gabapentin  300 mg Oral QHS  . latanoprost  1 drop Both Eyes QHS  . levothyroxine  112 mcg Oral QAC breakfast  . loratadine  10 mg Oral Daily  . mouth rinse  15 mL Mouth Rinse q12n4p  . mirtazapine  30 mg Oral QHS  . pantoprazole  40 mg Oral Daily  . sodium bicarbonate  650 mg Oral BID  . sodium chloride flush  3 mL Intravenous Q12H  . Warfarin - Pharmacist Dosing Inpatient   Does not apply q1800   Continuous Infusions: . cefTRIAXone (ROCEPHIN)  IV Stopped (10/06/16 1457)  . dextrose 50 mL/hr at 10/06/16 0921    LOS: 2 days   Kerney Elbe, DO [facility] Pager [phone]  If 7PM-7AM, please contact night-coverage [email or web address] Password [facility] 10/06/2016, 7:32 PM

## 2016-10-07 ENCOUNTER — Inpatient Hospital Stay (HOSPITAL_COMMUNITY): Payer: Medicare (Managed Care)

## 2016-10-07 LAB — COMPREHENSIVE METABOLIC PANEL
ALT: 79 U/L — AB (ref 14–54)
AST: 125 U/L — AB (ref 15–41)
Albumin: 2.5 g/dL — ABNORMAL LOW (ref 3.5–5.0)
Alkaline Phosphatase: 74 U/L (ref 38–126)
Anion gap: 10 (ref 5–15)
BUN: 11 mg/dL (ref 6–20)
CHLORIDE: 111 mmol/L (ref 101–111)
CO2: 20 mmol/L — ABNORMAL LOW (ref 22–32)
CREATININE: 1.08 mg/dL — AB (ref 0.44–1.00)
Calcium: 8.7 mg/dL — ABNORMAL LOW (ref 8.9–10.3)
GFR calc Af Amer: 57 mL/min — ABNORMAL LOW (ref >60)
GFR calc non Af Amer: 49 mL/min — ABNORMAL LOW (ref >60)
Glucose, Bld: 105 mg/dL — ABNORMAL HIGH (ref 65–99)
Potassium: 3.7 mmol/L (ref 3.5–5.1)
Sodium: 141 mmol/L (ref 135–145)
Total Bilirubin: 0.2 mg/dL — ABNORMAL LOW (ref 0.3–1.2)
Total Protein: 6.4 g/dL — ABNORMAL LOW (ref 6.5–8.1)

## 2016-10-07 LAB — CBC WITH DIFFERENTIAL/PLATELET
BASOS ABS: 0 10*3/uL (ref 0.0–0.1)
BASOS PCT: 0 %
Eosinophils Absolute: 0.4 10*3/uL (ref 0.0–0.7)
Eosinophils Relative: 5 %
HEMATOCRIT: 27.4 % — AB (ref 36.0–46.0)
HEMOGLOBIN: 9 g/dL — AB (ref 12.0–15.0)
Lymphocytes Relative: 18 %
Lymphs Abs: 1.4 10*3/uL (ref 0.7–4.0)
MCH: 26.4 pg (ref 26.0–34.0)
MCHC: 32.8 g/dL (ref 30.0–36.0)
MCV: 80.4 fL (ref 78.0–100.0)
MONO ABS: 0.4 10*3/uL (ref 0.1–1.0)
MONOS PCT: 5 %
NEUTROS ABS: 5.8 10*3/uL (ref 1.7–7.7)
NEUTROS PCT: 72 %
Platelets: 227 10*3/uL (ref 150–400)
RBC: 3.41 MIL/uL — ABNORMAL LOW (ref 3.87–5.11)
RDW: 16 % — AB (ref 11.5–15.5)
WBC: 8 10*3/uL (ref 4.0–10.5)

## 2016-10-07 LAB — T4, FREE: Free T4: 1.14 ng/dL — ABNORMAL HIGH (ref 0.61–1.12)

## 2016-10-07 LAB — GLUCOSE, CAPILLARY: Glucose-Capillary: 107 mg/dL — ABNORMAL HIGH (ref 65–99)

## 2016-10-07 LAB — PROTIME-INR
INR: 1.96
Prothrombin Time: 22.6 seconds — ABNORMAL HIGH (ref 11.4–15.2)

## 2016-10-07 LAB — PHOSPHORUS: PHOSPHORUS: 3.2 mg/dL (ref 2.5–4.6)

## 2016-10-07 LAB — PROCALCITONIN: PROCALCITONIN: 13.56 ng/mL

## 2016-10-07 LAB — TSH: TSH: 0.127 u[IU]/mL — ABNORMAL LOW (ref 0.350–4.500)

## 2016-10-07 LAB — LIPASE, BLOOD: LIPASE: 17 U/L (ref 11–51)

## 2016-10-07 LAB — MAGNESIUM: Magnesium: 1.7 mg/dL (ref 1.7–2.4)

## 2016-10-07 MED ORDER — SENNOSIDES-DOCUSATE SODIUM 8.6-50 MG PO TABS
1.0000 | ORAL_TABLET | Freq: Two times a day (BID) | ORAL | Status: DC
  Administered 2016-10-07 – 2016-10-08 (×3): 1 via ORAL
  Filled 2016-10-07 (×3): qty 1

## 2016-10-07 MED ORDER — DIPHENHYDRAMINE HCL 12.5 MG/5ML PO ELIX
12.5000 mg | ORAL_SOLUTION | Freq: Three times a day (TID) | ORAL | Status: DC | PRN
  Administered 2016-10-07: 12.5 mg via ORAL
  Filled 2016-10-07: qty 5

## 2016-10-07 MED ORDER — HYDRALAZINE HCL 20 MG/ML IJ SOLN: 15.0000 mg | INTRAMUSCULAR | Status: DC

## 2016-10-07 MED ORDER — LEVOTHYROXINE SODIUM 88 MCG PO TABS
88.0000 ug | ORAL_TABLET | Freq: Every day | ORAL | Status: DC
  Administered 2016-10-08: 88 ug via ORAL
  Filled 2016-10-07: qty 1

## 2016-10-07 MED ORDER — TRAMADOL HCL 50 MG PO TABS
50.0000 mg | ORAL_TABLET | Freq: Four times a day (QID) | ORAL | Status: DC | PRN
  Administered 2016-10-07 – 2016-10-08 (×3): 50 mg via ORAL
  Filled 2016-10-07 (×3): qty 1

## 2016-10-07 MED ORDER — WARFARIN SODIUM 5 MG PO TABS
5.0000 mg | ORAL_TABLET | Freq: Once | ORAL | Status: AC
  Administered 2016-10-07: 5 mg via ORAL
  Filled 2016-10-07: qty 1

## 2016-10-07 MED ORDER — POLYETHYLENE GLYCOL 3350 17 G PO PACK
17.0000 g | PACK | Freq: Two times a day (BID) | ORAL | Status: DC
  Administered 2016-10-07 – 2016-10-08 (×3): 17 g via ORAL
  Filled 2016-10-07 (×3): qty 1

## 2016-10-07 NOTE — Progress Notes (Signed)
Physical Therapy Treatment Patient Details Name: Norma Neal MRN: [id number] DOB: 04/17/1941 Today's Date: 10/07/2016    History of Present Illness  76 y.o. 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 Dx UTI, respiratory failure.     PT Comments    Assessed orthostatic BPs: supine 120/59, sit 108/55, stand 110/53. Pt reported mild dizziness in sitting and standing. Dizziness resolved after ~1 minute standing.  She stated she's had dizziness for ~ 2 months. She ambulated 160' with RW, no loss of balance.    Follow Up Recommendations  Home health PT     Equipment Recommendations  None recommended by PT    Recommendations for Other Services       Precautions / Restrictions Precautions Precautions: Fall Precaution Comments: 2 falls in past 1 week; pt reports she mostly uses WC at [facility] due to having dizziness for past couple months Restrictions Weight Bearing Restrictions: No    Mobility  Bed Mobility Overal bed mobility: Needs Assistance Bed Mobility: Rolling;Sidelying to Sit Rolling: Supervision Sidelying to sit: Min assist       General bed mobility comments: min A to raise trunk, VCs for technique  Transfers Overall transfer level: Needs assistance Equipment used: Rolling walker (2 wheeled) Transfers: Sit to/from Stand Sit to Stand: Min assist;Mod assist         General transfer comment: VCs hand placement, sit to stand x 2, 1st trial required mod A to rise, then min A to rise 2nd trial  Ambulation/Gait Ambulation/Gait assistance: Min guard Ambulation Distance (Feet): 160 Feet Assistive device: Rolling walker (2 wheeled) Gait Pattern/deviations: Decreased step length - right;Decreased step length - left;Step-through pattern     General Gait Details: steady, no LOB   Stairs            Wheelchair Mobility    Modified Rankin  (Stroke Patients Only)       Balance Overall balance assessment: History of Falls                                          Cognition Arousal/Alertness: Awake/alert Behavior During Therapy: WFL for tasks assessed/performed Overall Cognitive Status: Within Functional Limits for tasks assessed                                        Exercises      General Comments        Pertinent Vitals/Pain Pain Assessment: No/denies pain    Home Living                      Prior Function            PT Goals (current goals can now be found in the care plan section) Acute Rehab PT Goals Patient Stated Goal: likes to do art at day program PT Goal Formulation: With patient Time For Goal Achievement: 10/19/16 Potential to Achieve Goals: Good Progress towards PT goals: Progressing toward goals    Frequency    Min 3X/week      PT Plan Current plan remains appropriate    Co-evaluation              AM-PAC PT "6 Clicks" Daily Activity  Outcome Measure  Difficulty turning over in bed (including adjusting bedclothes, sheets and blankets)?: A Little Difficulty moving from lying on back to sitting on the side of the bed? : A Little Difficulty sitting down on and standing up from a chair with arms (e.g., wheelchair, bedside commode, etc,.)?: A Little Help needed moving to and from a bed to chair (including a wheelchair)?: A Little Help needed walking in hospital room?: A Little Help needed climbing 3-5 steps with a railing? : A Little 6 Click Score: 18    End of Session Equipment Utilized During Treatment: Gait belt Activity Tolerance: Patient tolerated treatment well Patient left: in chair;with call bell/phone within reach;with chair alarm set Nurse Communication: Mobility status PT Visit Diagnosis: History of falling (Z91.81);Difficulty in walking, not elsewhere classified (R26.2)     Time: 1610-9604 PT Time Calculation (min)  (ACUTE ONLY): 25 min  Charges:  $Gait Training: 8-22 mins $Therapeutic Activity: 8-22 mins                    G Codes:      Tamala Ser 10/07/2016, 9:02 AM [phone]

## 2016-10-07 NOTE — Progress Notes (Signed)
ANTICOAGULATION CONSULT NOTE - Follow Up Consult  Pharmacy Consult for warfarin Indication:PMH DVT  No Known Allergies  Patient Measurements: Height: 5\' 1"  (154.9 cm) Weight: 130 lb 1.1 oz (59 kg) IBW/kg (Calculated) : 47.8   Vital Signs: Temp: 100.4 F (38 C) (06/11 0526) Temp Source: Oral (06/11 0526) BP: 111/55 (06/11 0728) Pulse Rate: 73 (06/11 0728)  Labs:  Recent Labs  10/05/16 0519 10/06/16 0511 10/07/16 0541  HGB 9.8* 9.3* 9.0*  HCT 30.3* 28.6* 27.4*  PLT 208 235 227  LABPROT 36.7* 29.0* 22.6*  INR 3.59 2.68 1.96  CREATININE 1.57* 1.24* 1.08*    Estimated Creatinine Clearance: 37.2 mL/min (A) (by C-G formula based on SCr of 1.08 mg/dL (H)).    Assessment: Pharmacy consulted to continue warfarin in this [age] yoF admitted 6/8 for recurrent falls and confusion. PMH DVT HTN, hypothyroid, Alzheimer's, CKD3.  Home warfarin regimen 5 mg daily, last dose 6/7  10/07/2016 INR 1.96, subtherapeutic Scr 1.08, CrCl ~ 37 (improving) H/H low but stable Plts WNL DI: none, also takes daily ASA 81mg . Pt dose of levothyroxine decreased from 112 mcg to 88 mcg starting 6/12 Two meals documented yesterday  Goal of Therapy:  INR 2-3   Plan:  Warfarin 5 mg po x 1 at 1800 Daily INR May want to consider risks vs benefits of continuing warfarin in patient with recurrent falls (unless d/t an acute modifiable cause)    Adalberto ColeNikola Vicky Schleich, PharmD, BCPS Pager [phone] 10/07/2016 11:44 AM

## 2016-10-07 NOTE — Progress Notes (Signed)
PROGRESS NOTE    Norma Neal  UPJ:[id number] DOB: May 20, 1940 DOA: 10/04/2016 PCP: Janifer Adie, MD   Brief Narrative:  Norma Neal is a 76 y.o. 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 Pt apparently has history of recurrent falls especially over past 2 weeks as per family report. Her family was not present at bedside at this time to give more details of HPI. Pt felt dizzy prior to the fall but was not entirely sure if she has lost consciousness or not. Per family report in ED, pt apparently more confused since the fall. No chest pain, shortness of breath or palpitations. No abdominal pain, nausea or vomiting. No fevers, chills. No cough. No urinary complaints.In the ED CXR showed no acute cardiopulmonary findings. X rays of the left humerus and femur showed no acute fractures. 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 She was also found to have UTI and was started on Rocephin and it was an E Coli UTI. Patient is currently being hydrated and is improving. PT evaluated and recommended Home Health PT. Patient states she is feeling a little better but complained of medial thigh pain yesterday. Patient had a mild temperature this AM but felt better. Nursing stated she was walking the halls with PT and had no issues.    Assessment & Plan:   Principal Problem:   Fall at home, initial encounter Active Problems:   Anemia of chronic disease   Alzheimer's disease   Hypothyroidism   CKD (chronic kidney disease) stage 3, GFR 30-59 ml/min   Acute respiratory failure with hypoxia (HCC)   Leukocytosis   Hypokalemia   Acute lower UTI  Sepsis 2/2 to Acute Lower E Coli UTI -Patient met sepsis criteria with Leukocytosis, Tachycardia, Tachypnea and source of Infection -Sepsis Physiology is resolving but patient had a  Temp of 100.4 this AM -WBC went from 10.8 -> 7.9 -> 8.4 -> 8.0 -Procalcitonin Level was 55.03 and trended down to 13.56 -Urinalysis showed a Cloudy Urine with Many Bacteria, Large Leukocytes, Negative Nitrites and TNTC WBC; -Urine Cx Showed >=100,000 COLONIES/mL ESCHERICHIA COLI -UTI was Pan-sensitive  -Blood Cx x2 Showed NGTD at 3 days -Patient was started on Empiric IV Ceftriaxone and will continue -D/C'd IVF w D5W at 50 mL/hr; encouraged po Intake -Repeat CBC in AM  Acute Respiratory Failure with Hypoxia  -Had brief Episode where she desaturated to 79% -Stable respiratory status -Oxygen saturation now in mid 90's with  2 L oxygen  -No acute cardiopulmonary findings on CXR -Continue with Continuous Pulse Oximetry and Supplemental O2 as Necessary -Maintain O2 Saturations > 92% and Weak O2 as tolerated  Recurrent Falls at Home, initial encounter -Likely in the setting of UTI -Obtained PT Eval and recommended Home Health PT -No acute fractures on x rays (left hip and left arm)  -No acute intracranial findings on CT head other than 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 Syncope -Likely related to UTI and Orthostasis  -Continue to Monitor on Telemetry -Head CT w/o Contrast showed atrophy with small vessel chronic ischemic changes of the deep cerebral white matter with multiple old infarcts. 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  -Patient's Orthostatics still not fully complete  -PT recommends Home Health PT -If happens again will get ECHO   Hypokalemia, improved -Patient was Hypokalemic on Admission at 3.2 and then it was improved to 3.7 -Continue to Monitor K+ and Replete as Necessary -Repeat CMP in AM   HyperNa+/Hypercholremia, improved -Patient's Sodium was 146  and Chloride was 118 yesterday and improved to 141 and 111 today -D/C'd IVF at D5W -Repeat CMP in AM   Anemia of Chronic Disease -Likely from CKD -Hb/Hct went from 10.0/30.0 -> 9.8/30.3 -> 9.3/28.6 -> 9.0/27.4 -D/C'd IVF with D5W as possible dilutional component  -Continue to Monitor for S/Sx of Bleeding -Repeat CBC with Diff in AM  Essential Hypertension -C/w Carvedilol 3.125 mg po BID and with Amlodipine 10 mg po Daily -Will add IV Hydralazine 10 mg q6hprn for SBP >180 or DBP >100  Alzheimer's Disease / Acute Metabolic Encephalopathy -Encephalopathy has improved  -C/w Donepezil 10 mg po qHS and with Escitalopram 10 mg po Daily -C/w Mirtazapine 30 mg po qHS  Hypothyroidism -Check TSH and Free T4 -C/w Levothyroxine 112 mcg  CKD (chronic kidney disease) stage 3, GFR 28-31 ml/min / Metabolic acidosis  -Cr in 2013 was as high as 2.3  -Cr on this admission is within baseline range -BUN/Cr improved from 38/2.28 on Admission to 11/1.08 -Continue Sodium Bicarbonate 650 mg po BID  History of DVT -INR was Supratherapeutic at 3.82 on admission and repeat this AM was 1.96 -Pharmacy Following and Appreciate Coumadin Dosing  -Will get 1 dose of 5 mg po tonight  -Repeat PT-INR in AM  Low Tension Open-Angle Glaucoma -C/w Latanoprost 1 drop Both Eyes Daily qHS and with Brimonidine 0.15% Opthalmic Solution 1 drop both Eyes qHS   GERD/Esophageal Reflux -C/w Famotidine 20 mg po qHS and with Pantoprazole 40 mg po Daily  Hx of Chronic Obstructive Asthma, Unspecified -C/w Albuterol Nebs 2.5 mg q6hprn for wheezing and SOB  Abnormal LFT's/Transaminitis -Unclear Etiology -AST went from 167 -> 207 -> 172 -> 125 -ALT went from 59 -> 62 -> 85 -> 79 -Will check Acute Hepatitis Panel  -RUQ U/S showed Known gallstones (largest was 6.2 mm). Mild gallbladder wall thickening to 3.3 mm ; no positive sonographic Murphy's sign. The hepatic echotexture is normal. There is a simple appearing cyst in  the left hepatic lobe measuring 5 x 5 x 7 mm. -Repeat CMP in AM  DVT prophylaxis: Anticoagulated with Coumadin  Code Status: FULL CODE Family Communication: No family present at bedside Disposition Plan: Home with Home Health PT at D/C when medically stable likely in AM  Consultants:   None   Procedures: None  Antimicrobials: Anti-infectives    Start     Dose/Rate Route Frequency Ordered Stop   10/05/16 1400  cefTRIAXone (ROCEPHIN) 1 g in dextrose 5 % 50 mL IVPB     1 g 100 mL/hr over 30 Minutes Intravenous Every 24 hours 10/04/16 1415     10/04/16 1430  cefTRIAXone (ROCEPHIN) 1 g in dextrose 5 % 50 mL IVPB  Status:  Discontinued     1 g 100 mL/hr over 30 Minutes Intravenous Every 24 hours 10/04/16 1415 10/04/16 1415   10/04/16 1345  cefTRIAXone (ROCEPHIN) 1 g in dextrose 5 % 50 mL IVPB     1 g 100 mL/hr over 30 Minutes Intravenous  Once 10/04/16 1330 10/04/16 1557     Subjective: Seen and examined and was feeling good. Stated she still had some medial thigh pain but  wasn't too bad. Denied any nausea or SOB. No other concerns or complaints at this time.   Objective: Vitals:   10/07/16 0526 10/07/16 0728 10/07/16 0858 10/07/16 1311  BP: (!) 130/59 (!) 111/55  124/66  Pulse: 87 73  95  Resp: 20   14  Temp: (!) 100.4 F (38 C)   98.6 F (37 C)  TempSrc: Oral   Oral  SpO2: 100%  97% 100%  Weight:      Height:        Intake/Output Summary (Last 24 hours) at 10/07/16 1734 Last data filed at 10/07/16 1500  Gross per 24 hour  Intake             1950 ml  Output                0 ml  Net             1950 ml   Filed Weights   10/05/16 0442 10/06/16 0424 10/07/16 0525  Weight: 60.3 kg (132 lb 15 oz) 63.2 kg (139 lb 5.3 oz) 59 kg (130 lb 1.1 oz)   Examination: Physical Exam:  Constitutional: Pleasant 76 yo AAF in NAD sitting bedside and is calm Eyes:  Sclerae anicteric; Lids normal ENMT: Grossly normal hearing; Mucous membranes appear moist Neck: Supple with no  visible JVD Respiratory: CTAB; Patient not tachypenic or using any accessory muscles to breathe Cardiovascular: RRR; S1 S2. No appreciable LE edema Abdomen: Soft, NT, ND. Bowel sounds present GU: Deferred Musculoskeletal: No cyanosis; No contractures Skin: Warm and dry. No rashes or lesions noted on a limited skin evaluation Neurologic: CN 2-12 grossly intact. No appreciable focal deficits Psychiatric: Normal mood and affect. Intact Judgement and insight  Data Reviewed: I have personally reviewed following labs and imaging studies  CBC:  Recent Labs Lab 10/04/16 1207 10/04/16 1812 10/05/16 0519 10/06/16 0511 10/07/16 0541  WBC 10.8* 10.2 7.9 8.4 8.0  NEUTROABS 9.8* 9.1*  --  6.8 5.8  HGB 9.8* 10.0* 9.8* 9.3* 9.0*  HCT 30.2* 30.0* 30.3* 28.6* 27.4*  MCV 82.1 82.2 82.6 82.4 80.4  PLT 232 180 208 235 027   Basic Metabolic Panel:  Recent Labs Lab 10/04/16 1207 10/04/16 1812 10/05/16 0519 10/06/16 0511 10/07/16 0541  NA 143 144 144 146* 141  K 3.2* 3.8 3.3* 4.2 3.7  CL 113* 114* 114* 118* 111  CO2 18* 18* 20* 17* 20*  GLUCOSE 122* 110* 113* 111* 105*  BUN 38* 32* 25* 16 11  CREATININE 2.28* 1.96* 1.57* 1.24* 1.08*  CALCIUM 8.4* 8.2* 8.5* 8.9 8.7*  MG  --  2.0  --  1.9 1.7  PHOS  --  2.4*  --  2.1* 3.2   GFR: Estimated Creatinine Clearance: 37.2 mL/min (A) (by C-G formula based on SCr of 1.08 mg/dL (H)). Liver Function Tests:  Recent Labs Lab 10/04/16 1207 10/04/16 1812 10/06/16 0511 10/07/16 0541  AST 167* 207* 172* 125*  ALT 49 62* 85* 79*  ALKPHOS 77 77 79 74  BILITOT 0.6 0.5 0.3 0.2*  PROT 7.1 6.9 6.7 6.4*  ALBUMIN 3.2* 3.0* 2.9* 2.5*    Recent Labs Lab 10/07/16 0541  LIPASE 17   No results for input(s): AMMONIA in the last 168 hours. Coagulation Profile:  Recent Labs Lab 10/04/16 1207 10/05/16 0519 10/06/16 0511 10/07/16 0541  INR 3.82 3.59 2.68 1.96   Cardiac Enzymes: No results for input(s): CKTOTAL, CKMB, CKMBINDEX, TROPONINI in the  last 168 hours. BNP (last 3  results) No results for input(s): PROBNP in the last 8760 hours. HbA1C: No results for input(s): HGBA1C in the last 72 hours. CBG:  Recent Labs Lab 10/05/16 0824 10/06/16 0753 10/07/16 0815  GLUCAP 98 94 107*   Lipid Profile: No results for input(s): CHOL, HDL, LDLCALC, TRIG, CHOLHDL, LDLDIRECT in the last 72 hours. Thyroid Function Tests:  Recent Labs  10/07/16 0541 10/07/16 0542  TSH 0.127*  --   FREET4  --  1.14*   Anemia Panel: No results for input(s): VITAMINB12, FOLATE, FERRITIN, TIBC, IRON, RETICCTPCT in the last 72 hours. Sepsis Labs:  Recent Labs Lab 10/04/16 1812 10/04/16 2021 10/07/16 0541  PROCALCITON 55.03  --  13.56  LATICACIDVEN 1.6 1.1  --     Recent Results (from the past 240 hour(s))  Urine culture     Status: Abnormal   Collection Time: 10/04/16 10:23 AM  Result Value Ref Range Status   Specimen Description URINE, RANDOM  Final   Special Requests NONE  Final   Culture >=100,000 COLONIES/mL ESCHERICHIA COLI (A)  Final   Report Status 10/06/2016 FINAL  Final   Organism ID, Bacteria ESCHERICHIA COLI (A)  Final      Susceptibility   Escherichia coli - MIC*    AMPICILLIN <=2 SENSITIVE Sensitive     CEFAZOLIN <=4 SENSITIVE Sensitive     CEFTRIAXONE <=1 SENSITIVE Sensitive     CIPROFLOXACIN <=0.25 SENSITIVE Sensitive     GENTAMICIN <=1 SENSITIVE Sensitive     IMIPENEM <=0.25 SENSITIVE Sensitive     NITROFURANTOIN <=16 SENSITIVE Sensitive     TRIMETH/SULFA <=20 SENSITIVE Sensitive     AMPICILLIN/SULBACTAM <=2 SENSITIVE Sensitive     PIP/TAZO <=4 SENSITIVE Sensitive     Extended ESBL NEGATIVE Sensitive     * >=100,000 COLONIES/mL ESCHERICHIA COLI  Culture, blood (x 2)     Status: None (Preliminary result)   Collection Time: 10/04/16  6:12 PM  Result Value Ref Range Status   Specimen Description BLOOD LEFT ARM  Final   Special Requests IN PEDIATRIC BOTTLE Blood Culture adequate volume  Final   Culture   Final     NO GROWTH 3 DAYS Performed at [facility], [place] [address] [place], [place] [address]    Report Status PENDING  Incomplete  Culture, blood (x 2)     Status: None (Preliminary result)   Collection Time: 10/04/16  8:21 PM  Result Value Ref Range Status   Specimen Description BLOOD LEFT ARM  Final   Special Requests IN PEDIATRIC BOTTLE Blood Culture adequate volume  Final   Culture   Final    NO GROWTH 3 DAYS Performed at [facility], [place] [address] [place], [place] [address]    Report Status PENDING  Incomplete     Radiology Studies: US Abdomen Limited Ruq  Result Date: 10/07/2016 CLINICAL DATA:  Elevated liver function studies. History of diabetes and hypertension. EXAM: ULTRASOUND ABDOMEN LIMITED RIGHT UPPER QUADRANT COMPARISON:  Abdominal and pelvic CT scan of January 18, 2015 an abdominal ultrasound of November 02, 2012. FINDINGS: Gallbladder: There known gallstones. The largest measures 6.2 mm. There is mild thickening to 3.3 mm of the gallbladder wall but no pericholecystic fluid or positive sonographic Murphy's sign. Common bile duct: Diameter: 7.4 mm.  No intraluminal sludge or stones are observed. Liver: The hepatic echotexture is normal. There is a simple appearing cyst in the left hepatic lobe measuring 5 x 5 x 7 mm. IMPRESSION: Known gallstones. Mild gallbladder wall  thickening to 3.3 mm ; no positive sonographic Murphy's sign. Simple appearing subcentimeter cyst in the left hepatic lobe. Electronically Signed   By: David  Martinique M.D.   On: 10/07/2016 08:18   Scheduled Meds: . amLODipine  10 mg Oral Daily  . aspirin EC  81 mg Oral Daily  . brimonidine  1 drop Both Eyes QHS  . carvedilol  3.125 mg Oral BID WC  . chlorhexidine  15 mL Mouth Rinse BID  . cholecalciferol  1,000 Units Oral Daily  . donepezil  10 mg Oral QHS  . escitalopram  10 mg Oral Daily  . famotidine  20 mg Oral QHS  . gabapentin  300 mg Oral QHS  . latanoprost  1 drop Both Eyes QHS  . [START ON  10/08/2016] levothyroxine  88 mcg Oral QAC breakfast  . loratadine  10 mg Oral Daily  . mouth rinse  15 mL Mouth Rinse q12n4p  . mirtazapine  30 mg Oral QHS  . pantoprazole  40 mg Oral Daily  . polyethylene glycol  17 g Oral BID  . senna-docusate  1 tablet Oral BID  . sodium bicarbonate  650 mg Oral BID  . sodium chloride flush  3 mL Intravenous Q12H  . Warfarin - Pharmacist Dosing Inpatient   Does not apply q1800   Continuous Infusions: . cefTRIAXone (ROCEPHIN)  IV Stopped (10/07/16 1430)    LOS: 3 days   Kerney Elbe, DO [facility] Pager [phone]  If 7PM-7AM, please contact night-coverage [email or web address] Password [facility] 10/07/2016, 5:34 PM

## 2016-10-08 LAB — COMPREHENSIVE METABOLIC PANEL
ALBUMIN: 2.4 g/dL — AB (ref 3.5–5.0)
ALT: 66 U/L — ABNORMAL HIGH (ref 14–54)
ANION GAP: 10 (ref 5–15)
AST: 72 U/L — ABNORMAL HIGH (ref 15–41)
Alkaline Phosphatase: 69 U/L (ref 38–126)
BUN: 12 mg/dL (ref 6–20)
CHLORIDE: 114 mmol/L — AB (ref 101–111)
CO2: 20 mmol/L — ABNORMAL LOW (ref 22–32)
Calcium: 8.6 mg/dL — ABNORMAL LOW (ref 8.9–10.3)
Creatinine, Ser: 1.11 mg/dL — ABNORMAL HIGH (ref 0.44–1.00)
GFR calc non Af Amer: 47 mL/min — ABNORMAL LOW (ref >60)
GFR, EST AFRICAN AMERICAN: 55 mL/min — AB (ref >60)
GLUCOSE: 92 mg/dL (ref 65–99)
Potassium: 3.7 mmol/L (ref 3.5–5.1)
Sodium: 144 mmol/L (ref 135–145)
Total Bilirubin: 0.2 mg/dL — ABNORMAL LOW (ref 0.3–1.2)
Total Protein: 6.2 g/dL — ABNORMAL LOW (ref 6.5–8.1)

## 2016-10-08 LAB — CBC WITH DIFFERENTIAL/PLATELET
BASOS PCT: 0 %
Basophils Absolute: 0 10*3/uL (ref 0.0–0.1)
EOS ABS: 0.4 10*3/uL (ref 0.0–0.7)
EOS PCT: 6 %
HCT: 26.9 % — ABNORMAL LOW (ref 36.0–46.0)
Hemoglobin: 8.9 g/dL — ABNORMAL LOW (ref 12.0–15.0)
Lymphocytes Relative: 17 %
Lymphs Abs: 1.2 10*3/uL (ref 0.7–4.0)
MCH: 27 pg (ref 26.0–34.0)
MCHC: 33.1 g/dL (ref 30.0–36.0)
MCV: 81.5 fL (ref 78.0–100.0)
MONO ABS: 0.4 10*3/uL (ref 0.1–1.0)
Monocytes Relative: 5 %
NEUTROS ABS: 5.1 10*3/uL (ref 1.7–7.7)
Neutrophils Relative %: 72 %
PLATELETS: 240 10*3/uL (ref 150–400)
RBC: 3.3 MIL/uL — AB (ref 3.87–5.11)
RDW: 15.9 % — ABNORMAL HIGH (ref 11.5–15.5)
WBC: 7.1 10*3/uL (ref 4.0–10.5)

## 2016-10-08 LAB — PHOSPHORUS: PHOSPHORUS: 4.1 mg/dL (ref 2.5–4.6)

## 2016-10-08 LAB — HEPATITIS PANEL, ACUTE
HEP A IGM: NEGATIVE
Hep B C IgM: NEGATIVE
Hepatitis B Surface Ag: NEGATIVE

## 2016-10-08 LAB — PROTIME-INR
INR: 1.75
Prothrombin Time: 20.7 seconds — ABNORMAL HIGH (ref 11.4–15.2)

## 2016-10-08 LAB — GLUCOSE, CAPILLARY: GLUCOSE-CAPILLARY: 83 mg/dL (ref 65–99)

## 2016-10-08 LAB — MAGNESIUM: Magnesium: 1.7 mg/dL (ref 1.7–2.4)

## 2016-10-08 LAB — PROCALCITONIN: PROCALCITONIN: 9.8 ng/mL

## 2016-10-08 MED ORDER — DOCUSATE SODIUM 100 MG PO CAPS: 100.0000 mg | ORAL_CAPSULE | Freq: Two times a day (BID) | ORAL | 0 refills | Status: DC | PRN

## 2016-10-08 MED ORDER — CEFUROXIME AXETIL 250 MG PO TABS: 250.0000 mg | ORAL_TABLET | Freq: Two times a day (BID) | ORAL | 0 refills | Status: DC

## 2016-10-08 MED ORDER — CEFUROXIME AXETIL 250 MG PO TABS: 250.0000 mg | ORAL_TABLET | Freq: Two times a day (BID) | ORAL | Status: DC

## 2016-10-08 MED ORDER — TRAMADOL HCL 50 MG PO TABS
50.0000 mg | ORAL_TABLET | Freq: Four times a day (QID) | ORAL | 0 refills | Status: DC | PRN
Start: 2016-10-08 — End: 2016-10-08

## 2016-10-08 MED ORDER — WARFARIN SODIUM 5 MG PO TABS
5.0000 mg | ORAL_TABLET | Freq: Once | ORAL | Status: AC
  Administered 2016-10-08: 5 mg via ORAL
  Filled 2016-10-08: qty 1

## 2016-10-08 MED ORDER — LEVOTHYROXINE SODIUM 88 MCG PO TABS
88.0000 ug | ORAL_TABLET | Freq: Every day | ORAL | 0 refills | Status: DC
Start: 2016-10-09 — End: 2017-11-27

## 2016-10-08 MED ORDER — TRAMADOL HCL 50 MG PO TABS
50.0000 mg | ORAL_TABLET | Freq: Four times a day (QID) | ORAL | 0 refills | Status: DC | PRN
Start: 2016-10-08 — End: 2017-11-27

## 2016-10-08 MED ORDER — ONDANSETRON HCL 4 MG PO TABS: 4.0000 mg | ORAL_TABLET | Freq: Four times a day (QID) | ORAL | 0 refills | Status: DC | PRN

## 2016-10-08 NOTE — Care Management Important Message (Signed)
Important Message  Patient Details  Name: Norma Neal MRN: [id number] Date of Birth: 03/07/1941   Medicare Important Message Given:  Yes    Caren MacadamFuller, Lyndzee Kliebert 10/08/2016, 10:47 AMImportant Message  Patient Details  Name: Norma Neal MRN: [id number] Date of Birth: 10/10/1940   Medicare Important Message Given:  Yes    Caren MacadamFuller, Christyana Corwin 10/08/2016, 10:46 AM

## 2016-10-08 NOTE — NC FL2 (Signed)
[place] MEDICAID FL2 LEVEL OF CARE SCREENING TOOL     IDENTIFICATION  Patient Name: Norma Neal Real Birthdate: 02/24/1941 Sex: female Admission Date (Current Location): 10/04/2016  [facility] [place]   [facility] Address[facility]  [facility],  [address], [place] [id number]      Provider Number: [id number]  Attending Physician Name and Address:  Merlene LaughterSheikh, Omair Latif, DO  Relative Name and Phone Number:       Current Level of Care: Hospital Recommended Level of Care: Skilled Nursing Facility Prior Approval Number:    Date Approved/Denied:   PASRR Number:  [id number]   Discharge Plan: SNF    Current Diagnoses: Patient Active Problem List   Diagnosis Date Noted  . Fall at home, initial encounter 10/04/2016  . Acute respiratory failure with hypoxia (HCC) 10/04/2016  . Leukocytosis 10/04/2016  . Hypokalemia 10/04/2016  . Acute lower UTI 10/04/2016  . Alzheimer's disease 08/18/2014  . Hypothyroidism 08/18/2014  . Other specified transient cerebral ischemias   . CKD (chronic kidney disease) stage 3, GFR 30-59 ml/min   . Anemia of chronic disease 08/17/2014    Orientation RESPIRATION BLADDER Height & Weight     Self, Time, Situation, Place  Normal Incontinent Weight: 129 lb 3 oz (58.6 kg) Height:  5\' 1"  (154.9 cm)  BEHAVIORAL SYMPTOMS/MOOD NEUROLOGICAL BOWEL NUTRITION STATUS      Continent Diet (Regular)  AMBULATORY STATUS COMMUNICATION OF NEEDS Skin   Limited Assist Verbally Normal                       Personal Care Assistance Level of Assistance  Bathing, Feeding, Dressing Bathing Assistance: Limited assistance Feeding assistance: Independent Dressing Assistance: Limited assistance     Functional Limitations Info             SPECIAL CARE FACTORS FREQUENCY  PT (By licensed PT), OT (By licensed OT)     PT Frequency: 5x/week OT Frequency: 5x/week            Contractures Contractures Info: Not present     Additional Factors Info  Code Status, Allergies Code Status Info: Full Code Allergies Info: NKA           Current Medications (10/08/2016):  This is the current hospital active medication list Current Facility-Administered Medications  Medication Dose Route Frequency Provider Last Rate Last Dose  . acetaminophen (TYLENOL) tablet 650 mg  650 mg Oral Q6H PRN Alison Murrayevine, Alma M, MD   650 mg at 10/07/16 1519   Or  . acetaminophen (TYLENOL) suppository 650 mg  650 mg Rectal Q6H PRN Alison Murrayevine, Alma M, MD      . albuterol (PROVENTIL) (2.5 MG/3ML) 0.083% nebulizer solution 2.5 mg  2.5 mg Nebulization Q6H PRN Alison Murrayevine, Alma M, MD      . amLODipine (NORVASC) tablet 10 mg  10 mg Oral Daily Alison Murrayevine, Alma M, MD   10 mg at 10/07/16 0858  . aspirin EC tablet 81 mg  81 mg Oral Daily Alison Murrayevine, Alma M, MD   81 mg at 10/07/16 0858  . brimonidine (ALPHAGAN) 0.15 % ophthalmic solution 1 drop  1 drop Both Eyes QHS Alison Murrayevine, Alma M, MD   1 drop at 10/07/16 2142  . carvedilol (COREG) tablet 3.125 mg  3.125 mg Oral BID WC Alison Murrayevine, Alma M, MD   3.125 mg at 10/07/16 1702  . cefTRIAXone (ROCEPHIN) 1 g in dextrose 5 % 50 mL IVPB  1 g Intravenous Q24H Manson Passeyevine, Alma  M, MD   Stopped at 10/07/16 1430  . chlorhexidine (PERIDEX) 0.12 % solution 15 mL  15 mL Mouth Rinse BID Alison Murray, MD   15 mL at 10/07/16 2141  . cholecalciferol (VITAMIN D) tablet 1,000 Units  1,000 Units Oral Daily Alison Murray, MD   1,000 Units at 10/07/16 [phone]  . diphenhydrAMINE (BENADRYL) 12.5 MG/5ML elixir 12.5 mg  12.5 mg Oral Q8H PRN Leda Gauze, NP   12.5 mg at 10/07/16 2233  . donepezil (ARICEPT) tablet 10 mg  10 mg Oral QHS Alison Murray, MD   10 mg at 10/07/16 2142  . escitalopram (LEXAPRO) tablet 10 mg  10 mg Oral Daily Alison Murray, MD   10 mg at 10/07/16 0858  . famotidine (PEPCID) tablet 20 mg  20 mg Oral QHS Alison Murray, MD   20 mg at 10/07/16 2142  . gabapentin (NEURONTIN) capsule 300 mg  300 mg Oral QHS Alison Murray, MD   300  mg at 10/07/16 2141  . hydrALAZINE (APRESOLINE) injection 10 mg  10 mg Intravenous Q6H PRN Sheikh, Omair Latif, DO      . latanoprost (XALATAN) 0.005 % ophthalmic solution 1 drop  1 drop Both Eyes QHS Alison Murray, MD   1 drop at 10/07/16 2142  . levothyroxine (SYNTHROID, LEVOTHROID) tablet 88 mcg  88 mcg Oral QAC breakfast Sheikh, Omair Latif, DO      . loratadine (CLARITIN) tablet 10 mg  10 mg Oral Daily Alison Murray, MD   10 mg at 10/07/16 0858  . MEDLINE mouth rinse  15 mL Mouth Rinse q12n4p Alison Murray, MD   15 mL at 10/07/16 1703  . mirtazapine (REMERON) tablet 30 mg  30 mg Oral QHS Alison Murray, MD   30 mg at 10/07/16 2141  . ondansetron (ZOFRAN) tablet 4 mg  4 mg Oral Q6H PRN Alison Murray, MD       Or  . ondansetron [facility]) injection 4 mg  4 mg Intravenous Q6H PRN Alison Murray, MD      . pantoprazole (PROTONIX) EC tablet 40 mg  40 mg Oral Daily Alison Murray, MD   40 mg at 10/07/16 0858  . polyethylene glycol (MIRALAX / GLYCOLAX) packet 17 g  17 g Oral BID Marguerita Merles Ola, DO   17 g at 10/07/16 2141  . senna-docusate (Senokot-S) tablet 1 tablet  1 tablet Oral BID Marguerita Merles [place], DO   1 tablet at 10/07/16 2142  . sodium bicarbonate tablet 650 mg  650 mg Oral BID Alison Murray, MD   650 mg at 10/07/16 2141  . sodium chloride flush (NS) 0.9 % injection 3 mL  3 mL Intravenous Q12H Alison Murray, MD   3 mL at 10/07/16 2142  . traMADol (ULTRAM) tablet 50 mg  50 mg Oral Q6H PRN Marguerita Merles [place], DO   50 mg at 10/08/16 0424  . warfarin (COUMADIN) tablet 5 mg  5 mg Oral ONCE-1800 Sheikh, Omair [place], [place]      . Warfarin - Pharmacist Dosing Inpatient   Does not apply q1800 Merlene Laughter, DO         Discharge Medications: Please see discharge summary for a list of discharge medications.  Relevant Imaging Results:  Relevant Lab Results:   Additional Information SSN [id number]  Antionette Poles, LCSW

## 2016-10-08 NOTE — Discharge Summary (Signed)
Physician Discharge Summary  Norma Neal RJJ:[id number] DOB: December 30, 1940 DOA: 10/04/2016  PCP: Janifer Adie, MD  Admit date: 10/04/2016 Discharge date: 10/08/2016  Admitted From: Home Disposition:  SNF/[facility]  Recommendations for Outpatient Follow-up:  1. Follow up with PCP in 1-2 weeks 2. Please obtain CMP/CBC, Mag, Phos in one week 3. Patient will need PT-INR checked by 10/10/16 and Coumadin Dose adjusted as necessary 4. 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 Pt apparently has history of recurrent falls especially over past 2 weeks as per family report. Her family was not present at bedside at this time to give more details of HPI. Pt felt dizzy prior to the fall but was not entirely sure if she has lost consciousness or not. Per family report in ED, pt apparently more confused since the fall. No chest pain, shortness of breath or palpitations. No abdominal pain, nausea or vomiting. No fevers, chills. No cough. No urinary complaints.In the ED CXR showed no acute cardiopulmonary findings. X rays of the left humerus and femur showed no acute fractures. 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 She was also found to havea  UTI and was started on Rocephin and it was E Coli UTI which was pansentitive. Patien was hydrated and is improving. PT evaluated and recommended Home Health PT but PACE Program evaluated and Recommended SNF. Patient states she is feeling a little better but complained of medial thigh pain and a little today. She was not  Orthostatic in terms of BP but HR went up. She was deemed medically stable to be D/C'd to SNF and will need repeat Labwork in the next few days.   Discharge Diagnoses:  Principal Problem:   Fall at home, initial encounter Active Problems:   Anemia of chronic disease   Alzheimer's disease   Hypothyroidism   CKD (chronic kidney disease) stage 3, GFR 30-59 ml/min   Acute respiratory failure with hypoxia (HCC)   Leukocytosis   Hypokalemia   Acute lower UTI  Sepsis 2/2 to Acute Lower E Coli UTI, improving -Patient met sepsis criteria with Leukocytosis, Tachycardia, Tachypnea and source of Infection -Sepsis Physiology is improved -WBC went from 10.8 -> 7.9 -> 8.4 -> 8.0 -> 7.1 -Procalcitonin Level went from 55.03 -> 13.56 -> 9.80 -Urinalysis showed a Cloudy Urine with Many Bacteria, Large Leukocytes, Negative Nitrites and TNTC WBC; -Urine Cx Showed >=100,000 COLONIES/mL ESCHERICHIA COLI -UTI was Pan-sensitive  -Blood Cx x2 Showed NGTD at 3 days -Patient was started on Empiric IV Ceftriaxone and changed to po Cefuroxime 250 mg po BID x 2 more days -D/C'd IVF w D5W at 50 mL/hr; encouraged po Intake -Repeat CBC at SNF  Acute Respiratory Failure with Hypoxia  -Had brief Episode where she desaturated to 79% -Stable respiratory status -Weaned off of O2.   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  -Patient's Orthostatics are improved. HR went up with ambulation and 3 minutes of standing.  -PT recommends Home Health PT but PACE Program re-evaluated and will go to [facility] -If happens again recommending ECHO   Hypokalemia, improved -Patient was Hypokalemic on Admission at 3.2 and then it was improved to 3.7 -Continue to Monitor K+ and Replete as Necessary -Repeat CMP as an outpatient   Loose Stools -Clinically do not suspect C Difficile as patient has been afebrile today and has no Leukocytosis -She was on Scheduled Miralax and Senna-Docusate BID which have now been D/C'd -Would continue to Monitor and if worsens consider obtaining C Difficile Sampling via PCR  HyperNa+/Hypercholremia, improved -Patient's Sodium was 146 and Chloride was 118 yesterday and improved to 144 and 114 today -D/C'd IVF at D5W -Repeat CMP at SNF   Anemia of Chronic Disease -Likely from CKD -Hb/Hct went from 10.0/30.0 -> 9.8/30.3 -> 9.3/28.6 -> 9.0/27.4 -> 8.9/26.9 -D/C'd IVF with D5W as possible dilutional component  -Continue to Monitor for S/Sx of Bleeding -Repeat CBC with Diff at SNF  Essential Hypertension -C/w Carvedilol 3.125 mg po BID and with Amlodipine 10 mg po Daily -Per Record no longer taking Lisinopril -Will need to follow up with PCP and discuss regimen as an outpatient  Alzheimer's Disease / Acute Metabolic Encephalopathy -Encephalopathy has improved  -C/w Donepezil 10 mg po qHS and with Escitalopram 10 mg po Daily -C/w Mirtazapine 30 mg po qHS  Hypothyroidism -Checked TSH and was 0.127 and Free T4 was  1.14 -Decreased Levothyroxine 112 mcg to 88 mcg -Will need Repeat TSH and Free T4 within 4-6 weeks  CKD (chronic kidney disease) stage 3, GFR 40-98 ml/min / Metabolic acidosis  -Cr in 2013 was as high as 2.3  -Cr on this admission is within baseline range -BUN/Cr improved from 38/2.28 on Admission to 12/1.11 -Continue Sodium Bicarbonate 650 mg po BID as CO2 was 20  History of DVT -INR was Supratherapeutic at 3.82 on admission and repeat this AM was 1.75 -Pharmacy Followed and Appreciate Coumadin Dosing  -Will get 1 dose of 5 mg po today -Repeat PT-INR by 10/10/16  Low Tension Open-Angle Glaucoma -C/w Latanoprost 1 drop Both Eyes Daily qHS and with Brimonidine 0.15% Opthalmic Solution 1 drop both Eyes qHS   GERD/Esophageal Reflux -C/w Zantac Daily; Patient no longer taking PPI per records  Hx of Chronic Obstructive Asthma, Unspecified -C/w Albuterol Nebs 2.5 mg q6hprn for wheezing and SOB  Abnormal LFT's/Transaminitis, improving -Unclear Etiology but improving -AST went from 167 -> 207 -> 172 -> 125 -> 72 -ALT went from 59 -> 62 -> 85 -> 79 -> 66 -Checked Acute Hepatitis Panel and was NEgative -RUQ U/S showed Known gallstones (largest was 6.2 mm). Mild gallbladder wall thickening to 3.3 mm ; no positive sonographic Murphy's sign. The hepatic echotexture is normal.  There is a simple appearing cyst in the left hepatic lobe measuring 5 x 5 x 7 mm. -Repeat CMP at [facility]  Discharge Instructions  Discharge Instructions    Call MD for:  difficulty breathing, headache or visual disturbances    Complete by:  As directed    Call MD for:  extreme fatigue    Complete by:  As directed    Call MD for:  hives    Complete by:  As directed    Call MD for:  persistant dizziness or light-headedness    Complete by:  As directed    Call MD for:  persistant nausea and vomiting    Complete by:  As directed    Call MD for:  severe uncontrolled pain    Complete by:  As directed    Call MD  for:  temperature >100.4    Complete by:  As directed    Diet - low sodium heart healthy    Complete by:  As directed    Discharge instructions    Complete by:  As directed    Follow up Care at SNF. Will need to take all medications as prescribed. If symptoms change or worsen, please return to the ED for evaluation.   Increase activity slowly    Complete by:  As directed      Allergies as of 10/08/2016   No Known Allergies     Medication List    STOP taking these medications   lisinopril 40 MG tablet Commonly known as:  PRINIVIL,ZESTRIL   pantoprazole 40 MG tablet Commonly known as:  PROTONIX   risperiDONE 0.5 MG disintegrating tablet Commonly known as:  RISPERDAL M-TABS   risperiDONE 1 MG disintegrating tablet Commonly known as:  RISPERDAL M-TABS   sucralfate 1 GM/10ML suspension Commonly known as:  CARAFATE     TAKE these medications   albuterol 108 (90 Base) MCG/ACT inhaler Commonly known as:  PROVENTIL HFA;VENTOLIN HFA Inhale 1-2 puffs into the lungs every 6 (six) hours as needed for wheezing or shortness of breath.   albuterol (2.5 MG/3ML) 0.083% nebulizer solution Commonly known as:  PROVENTIL Take 2.5 mg by nebulization every 6 (six) hours as needed for wheezing or shortness of breath.   amLODipine 10 MG tablet Commonly known as:  NORVASC Take 10 mg by mouth daily.   aspirin EC 81 MG tablet Take 81 mg by mouth daily. What changed:  Another medication with the same name was removed. Continue taking this medication, and follow the directions you see here.   brimonidine 0.15 % ophthalmic solution Commonly known as:  ALPHAGAN Place 1 drop into both eyes at bedtime.   carvedilol 3.125 MG tablet Commonly known as:  COREG Take 1 tablet (3.125 mg total) by mouth 2 (two) times daily with a meal.   cefUROXime 250 MG tablet Commonly known as:  CEFTIN Take 1 tablet (250 mg total) by mouth 2 (two) times daily with a meal. Start taking on:  10/09/2016    cetirizine 10 MG tablet Commonly known as:  ZYRTEC Take 10 mg by mouth daily.   cholecalciferol 1000 units tablet Commonly known as:  VITAMIN D Take 1,000 Units by mouth daily.   docusate sodium 100 MG capsule Commonly known as:  COLACE Take 1 capsule (100 mg total) by mouth 2 (two) times daily as needed for mild constipation. What changed:  when to take this  reasons to take this   donepezil 10 MG tablet Commonly known as:  ARICEPT  Take 10 mg by mouth at bedtime.   escitalopram 10 MG tablet Commonly known as:  LEXAPRO Take 10 mg by mouth daily.   esomeprazole 40 MG capsule Commonly known as:  NEXIUM Take 1 capsule (40 mg total) by mouth 2 (two) times daily before a meal.   gabapentin 300 MG capsule Commonly known as:  NEURONTIN Take 300 mg by mouth at bedtime.   latanoprost 0.005 % ophthalmic solution Commonly known as:  XALATAN Place 1 drop into both eyes at bedtime.   levothyroxine 88 MCG tablet Commonly known as:  SYNTHROID, LEVOTHROID Take 1 tablet (88 mcg total) by mouth daily before breakfast. Start taking on:  10/09/2016 What changed:  medication strength  how much to take   mirtazapine 30 MG tablet Commonly known as:  REMERON Take 30 mg by mouth at bedtime.   ondansetron 4 MG tablet Commonly known as:  ZOFRAN Take 1 tablet (4 mg total) by mouth every 6 (six) hours as needed for nausea.   ranitidine 150 MG tablet Commonly known as:  ZANTAC Take 150 mg by mouth at bedtime.   sodium bicarbonate 650 MG tablet Take 650 mg by mouth 2 (two) times daily.   traMADol 50 MG tablet Commonly known as:  ULTRAM Take 1 tablet (50 mg total) by mouth every 6 (six) hours as needed for moderate pain.   warfarin 5 MG tablet Commonly known as:  COUMADIN Take 5 mg by mouth daily.      Contact information for after-discharge care    Destination    [facility] .   Specialty:  [place] information: [address]  [place] [phone]             No Known Allergies  Consultations:  None  Procedures/Studies: Dg Chest 2 View  Result Date: 10/04/2016 CLINICAL DATA:  Trauma secondary to a fall at home today. Multiple recent falls. EXAM: CHEST  2 VIEW COMPARISON:  Chest x-ray dated 08/20/2016 and 01/18/2015 FINDINGS: There is chronic deviation of the trachea to the left at the level of the thoracic inlet consistent with an enlarged right lobe of the thyroid gland. This is essentially unchanged since the prior exam. The deviation has increased slightly since 01/18/2015. Overall heart size is normal. Pulmonary vascularity is normal. The lungs are clear. Chronic deformity of the upper bony thorax, unchanged. Chronic degenerative changes of both shoulders. No acute bone abnormality. IMPRESSION: No active cardiopulmonary disease. Electronically Signed   By: Lorriane Shire M.D.   On: 10/04/2016 11:01   Dg Cervical Spine Complete  Result Date: 10/02/2016 CLINICAL DATA:  Patient c/o lateral left cervical spine pain that radiates into posterior left shoulder; patient fell down the stairs today; patient did not want to remove her earrings due to recently having them pierced EXAM: CERVICAL SPINE - COMPLETE 4+ VIEW COMPARISON:  None. FINDINGS: Normal alignment. Vertebral body and disc height maintained throughout. Early anterior endplate spurring Q7-Y1. No prevertebral soft tissue swelling. Multiple missing teeth and dental restorations. IMPRESSION: 1. Negative for fracture or other acute finding. 2. Mild degenerative spurring C3-C6. Electronically Signed   By: Lucrezia Europe M.D.   On: 10/02/2016 14:57   Ct Head Wo Contrast  Result Date: 10/04/2016 CLINICAL DATA:  Dementia, fell in bathroom today, earlier this week fell down stairs, no loss of consciousness, recurrent falls, dizziness, hypertension, diabetes mellitus, Alzheimer's EXAM: CT HEAD WITHOUT CONTRAST TECHNIQUE: Contiguous  axial images were obtained from the  base of the skull through the vertex without intravenous contrast. Sagittal and coronal MPR images reconstructed from axial data set. COMPARISON:  10/02/2016 FINDINGS: Brain: Mild generalized atrophy. Normal ventricular morphology. No midline shift or mass effect. Small vessel chronic ischemic changes of deep cerebral white matter. Multiple old infarcts including BILATERAL occipital lobes, RIGHT frontal lobe, RIGHT temporal lobe, and high LEFT frontal lobe. No new areas of infarction or intracranial hemorrhage. No extra-axial fluid collections. Calcified nodule LEFT parafalcine at frontal region could represent a dural calcification or a calcified meningioma 11 mm diameter, unchanged. Vascular: Unremarkable Skull: Demineralized but intact Sinuses/Orbits: Clear Other: N/A IMPRESSION: Atrophy with small vessel chronic ischemic changes of deep cerebral white matter. Multiple old infarcts. No acute intracranial abnormalities. Prominent dural calcification versus 11 mm calcified meningioma in the LEFT frontal parafalcine region unchanged. Electronically Signed   By: Lavonia Dana M.D.   On: 10/04/2016 11:08   Ct Head Wo Contrast  Result Date: 10/02/2016 CLINICAL DATA:  S/p fall this am , patient blacked out and is unsure of how she fell Her head hurts all over Confusion No hx of cancer No hx of surgery Prev x rays in pacs EXAM: CT HEAD WITHOUT CONTRAST TECHNIQUE: Contiguous axial images were obtained from the base of the skull through the vertex without intravenous contrast. COMPARISON:  08/17/2014 FINDINGS: Brain: Chronic bilateral occipital infarcts. Hypoattenuation in the right frontal lobe not present previously suggests interval infarct, age otherwise indeterminate. No acute intracranial hemorrhage. No midline shift. Mild diffuse parenchymal atrophy. Patchy areas of hypoattenuation in deep and periventricular white matter bilaterally. 12 mm partially calcified left frontal  parafalcine lesion is stable, possibly meningioma. Acute infarct may be inapparent on noncontrast CT. Ventricles nondilated. Vascular: No hyperdense vessel. Atherosclerotic and physiologic intracranial calcifications. Skull: Normal. Negative for fracture or focal lesion. Sinuses/Orbits: No acute finding. Other: None. IMPRESSION: 1. New focal right frontal parenchymal hypoattenuation since 08/17/2014, suggesting interval infarct. 2. Negative for  intracranial hemorrhage or other acute finding. 3. Atrophy, nonspecific white matter changes, and chronic bilateral occipital infarcts. Electronically Signed   By: Lucrezia Europe M.D.   On: 10/02/2016 15:04   US Venous Img Lower Unilateral Left  Result Date: 09/26/2016 CLINICAL DATA:  Left leg pain and swelling. EXAM: LEFT LOWER EXTREMITY VENOUS DOPPLER ULTRASOUND TECHNIQUE: Gray-scale sonography with graded compression, as well as color Doppler and duplex ultrasound were performed to evaluate the lower extremity deep venous systems from the level of the common femoral vein and including the common femoral, femoral, profunda femoral, popliteal and calf veins including the posterior tibial, peroneal and gastrocnemius veins when visible. The superficial great saphenous vein was also interrogated. Spectral Doppler was utilized to evaluate flow at rest and with distal augmentation maneuvers in the common femoral, femoral and popliteal veins. COMPARISON:  None. FINDINGS: Contralateral Common Femoral Vein: Respiratory phasicity is normal and symmetric with the symptomatic side. No evidence of thrombus. Normal compressibility. Common Femoral Vein: No evidence of thrombus. Normal compressibility, respiratory phasicity and response to augmentation. Saphenofemoral Junction: Thrombus. Profunda Femoral Vein: No evidence of thrombus. Normal compressibility and flow on color Doppler imaging. Femoral Vein: Nonocclusive thrombosis. Popliteal Vein: Nonocclusive thrombus. Calf Veins:  Occlusive thrombus in 1 of the posterior tibial veins. Superficial Great Saphenous Vein: Diffusely thrombosed. Venous Reflux:  None. Other Findings:  Superficial thrombophlebitis. IMPRESSION: 1. Deep venous thrombosis involving the femoral vein, popliteal vein and one of the posterior tibial veins. At least some of this may be chronic. 2. Extensive superficial thrombophlebitis, including  complete occlusion of the greater saphenous vein from the saphenous femoral junction to the ankle. Electronically Signed   By: Claudie Revering M.D.   On: 09/26/2016 15:01   Dg Shoulder Left  Result Date: 10/02/2016 CLINICAL DATA:  Left shoulder pain following fall, initial encounter EXAM: LEFT SHOULDER - 2+ VIEW COMPARISON:  None. FINDINGS: There is no evidence of fracture or dislocation. There is no evidence of arthropathy or other focal bone abnormality. Soft tissues are unremarkable. IMPRESSION: No acute abnormality noted. Electronically Signed   By: Inez Catalina M.D.   On: 10/02/2016 14:57   Dg Humerus Left  Result Date: 10/04/2016 CLINICAL DATA:  Left arm pain secondary to a fall in the bathroom at home today. EXAM: LEFT HUMERUS - 2+ VIEW COMPARISON:  Shoulder radiographs dated 10/03/2014 FINDINGS: There is no fracture or dislocation. There is moderate arthritis of the glenohumeral joint. No appreciable joint effusions. IMPRESSION: No acute abnormality.  Moderate arthritis of the glenohumeral joint. Electronically Signed   By: Lorriane Shire M.D.   On: 10/04/2016 11:05   Dg Foot Complete Left  Result Date: 10/02/2016 CLINICAL DATA:  Patient fell down the stairs today; c/o pain in entire left foot. EXAM: LEFT FOOT - COMPLETE 3+ VIEW COMPARISON:  None. FINDINGS: Mild degenerative changes at the first MTP and great toe interphalangeal joints. No fracture or dislocation. Normal mineralization and alignment. Small calcaneal spur at the plantar aponeurosis. Regional soft tissues unremarkable. IMPRESSION: 1. Negative for  fracture or other acute finding. 2. Mild degenerative changes as above. Electronically Signed   By: Lucrezia Europe M.D.   On: 10/02/2016 14:56   Dg Hip Unilat W Or W/o Pelvis 2-3 Views Left  Result Date: 10/04/2016 CLINICAL DATA:  Left hip pain secondary to a fall at home in the bathroom today. EXAM: DG HIP (WITH OR WITHOUT PELVIS) 2-3V LEFT COMPARISON:  None. FINDINGS: There is slight arthritic changes of the left hip. There is no fracture or dislocation. There are low similar but less prominent arthritic changes of the right hip. Pelvic bones are intact. Degenerative facet arthritis in the lower lumbar spine. IMPRESSION: No acute abnormalities. Electronically Signed   By: Lorriane Shire M.D.   On: 10/04/2016 11:04   Dg Femur Min 2 Views Left  Result Date: 10/04/2016 CLINICAL DATA:  Left hip and leg pain secondary to a fall today. EXAM: LEFT FEMUR 2 VIEWS COMPARISON:  None. FINDINGS: There is slight medial joint space narrowing of the left hip. Slight marginal osteophyte formation on the femoral head and acetabulum. No fracture or dislocation. No appreciable joint effusions. Slight degenerative changes of the knee joint. IMPRESSION: No acute abnormality of the left femur. Slight arthritic changes at the knee and hip. Electronically Signed   By: Lorriane Shire M.D.   On: 10/04/2016 11:02   US Abdomen Limited Ruq  Result Date: 10/07/2016 CLINICAL DATA:  Elevated liver function studies. History of diabetes and hypertension. EXAM: ULTRASOUND ABDOMEN LIMITED RIGHT UPPER QUADRANT COMPARISON:  Abdominal and pelvic CT scan of January 18, 2015 an abdominal ultrasound of November 02, 2012. FINDINGS: Gallbladder: There known gallstones. The largest measures 6.2 mm. There is mild thickening to 3.3 mm of the gallbladder wall but no pericholecystic fluid or positive sonographic Murphy's sign. Common bile duct: Diameter: 7.4 mm.  No intraluminal sludge or stones are observed. Liver: The hepatic echotexture is normal. There is  a simple appearing cyst in the left hepatic lobe measuring 5 x 5 x 7 mm. IMPRESSION: Known gallstones.  Mild gallbladder wall thickening to 3.3 mm ; no positive sonographic Murphy's sign. Simple appearing subcentimeter cyst in the left hepatic lobe. Electronically Signed   By: David  Martinique M.D.   On: 10/07/2016 08:18     Subjective: Seen and examined at bedside and felt better. Denied any SOB, N/V. Had some medial left thigh pain but was not as bad. Denied any other complaints or concerns and wanting to go.  Discharge Exam: Vitals:   10/07/16 2144 10/08/16 0400  BP: 132/70 128/68  Pulse: 85 95  Resp: 16 18  Temp: 98.4 F (36.9 C) 100 F (37.8 C)   Vitals:   10/07/16 1311 10/07/16 2144 10/08/16 0356 10/08/16 0400  BP: 124/66 132/70  128/68  Pulse: 95 85  95  Resp: 14 16  18   Temp: 98.6 F (37 C) 98.4 F (36.9 C)  100 F (37.8 C)  TempSrc: Oral Oral  Oral  SpO2: 100% 100%  97%  Weight:   58.6 kg (129 lb 3 oz)   Height:       General: Pt is a thin AAF who is alert, awake, not in acute distress Cardiovascular: RRR, S1/S2 +, no rubs, no gallops Respiratory: Slightly diminished bilaterally, no wheezing, no rhonchi Abdominal: Soft, NT, ND, bowel sounds + Extremities: no edema, no cyanosis  The results of significant diagnostics from this hospitalization (including imaging, microbiology, ancillary and laboratory) are listed below for reference.    Microbiology: Recent Results (from the past 240 hour(s))  Urine culture     Status: Abnormal   Collection Time: 10/04/16 10:23 AM  Result Value Ref Range Status   Specimen Description URINE, RANDOM  Final   Special Requests NONE  Final   Culture >=100,000 COLONIES/mL ESCHERICHIA COLI (A)  Final   Report Status 10/06/2016 FINAL  Final   Organism ID, Bacteria ESCHERICHIA COLI (A)  Final      Susceptibility   Escherichia coli - MIC*    AMPICILLIN <=2 SENSITIVE Sensitive     CEFAZOLIN <=4 SENSITIVE Sensitive     CEFTRIAXONE <=1  SENSITIVE Sensitive     CIPROFLOXACIN <=0.25 SENSITIVE Sensitive     GENTAMICIN <=1 SENSITIVE Sensitive     IMIPENEM <=0.25 SENSITIVE Sensitive     NITROFURANTOIN <=16 SENSITIVE Sensitive     TRIMETH/SULFA <=20 SENSITIVE Sensitive     AMPICILLIN/SULBACTAM <=2 SENSITIVE Sensitive     PIP/TAZO <=4 SENSITIVE Sensitive     Extended ESBL NEGATIVE Sensitive     * >=100,000 COLONIES/mL ESCHERICHIA COLI  Culture, blood (x 2)     Status: None (Preliminary result)   Collection Time: 10/04/16  6:12 PM  Result Value Ref Range Status   Specimen Description BLOOD LEFT ARM  Final   Special Requests IN PEDIATRIC BOTTLE Blood Culture adequate volume  Final   Culture   Final    NO GROWTH 3 DAYS Performed at [facility], [address], [place], [place] [address]    Report Status PENDING  Incomplete  Culture, blood (x 2)     Status: None (Preliminary result)   Collection Time: 10/04/16  8:21 PM  Result Value Ref Range Status   Specimen Description BLOOD LEFT ARM  Final   Special Requests IN PEDIATRIC BOTTLE Blood Culture adequate volume  Final   Culture   Final    NO GROWTH 3 DAYS Performed at [facility], [place] [address] [place], [place] [address]    Report Status PENDING  Incomplete    Labs:  BNP (last 3 results) No results for input(s): BNP in the last 8760 hours. Basic Metabolic Panel:  Recent Labs Lab 10/04/16 1812 10/05/16 0519 10/06/16 0511 10/07/16 0541 10/08/16 0517  NA 144 144 146* 141 144  K 3.8 3.3* 4.2 3.7 3.7  CL 114* 114* 118* 111 114*  CO2 18* 20* 17* 20* 20*  GLUCOSE 110* 113* 111* 105* 92  BUN 32* 25* 16 11 12   CREATININE 1.96* 1.57* 1.24* 1.08* 1.11*  CALCIUM 8.2* 8.5* 8.9 8.7* 8.6*  MG 2.0  --  1.9 1.7 1.7  PHOS 2.4*  --  2.1* 3.2 4.1   Liver Function Tests:  Recent Labs Lab 10/04/16 1207 10/04/16 1812 10/06/16 0511 10/07/16 0541 10/08/16 0517  AST 167* 207* 172* 125* 72*  ALT 49 62* 85* 79* 66*  ALKPHOS 77 77 79 74 69  BILITOT 0.6 0.5  0.3 0.2* 0.2*  PROT 7.1 6.9 6.7 6.4* 6.2*  ALBUMIN 3.2* 3.0* 2.9* 2.5* 2.4*    Recent Labs Lab 10/07/16 0541  LIPASE 17   No results for input(s): AMMONIA in the last 168 hours. CBC:  Recent Labs Lab 10/04/16 1207 10/04/16 1812 10/05/16 0519 10/06/16 0511 10/07/16 0541 10/08/16 0517  WBC 10.8* 10.2 7.9 8.4 8.0 7.1  NEUTROABS 9.8* 9.1*  --  6.8 5.8 5.1  HGB 9.8* 10.0* 9.8* 9.3* 9.0* 8.9*  HCT 30.2* 30.0* 30.3* 28.6* 27.4* 26.9*  MCV 82.1 82.2 82.6 82.4 80.4 81.5  PLT 232 180 208 235 227 240   Cardiac Enzymes: No results for input(s): CKTOTAL, CKMB, CKMBINDEX, TROPONINI in the last 168 hours. BNP: Invalid input(s): POCBNP CBG:  Recent Labs Lab 10/05/16 0824 10/06/16 0753 10/07/16 0815 10/08/16 0734  GLUCAP 98 94 107* 83   D-Dimer No results for input(s): DDIMER in the last 72 hours. Hgb A1c No results for input(s): HGBA1C in the last 72 hours. Lipid Profile No results for input(s): CHOL, HDL, LDLCALC, TRIG, CHOLHDL, LDLDIRECT in the last 72 hours. Thyroid function studies  Recent Labs  10/07/16 0541  TSH 0.127*   Anemia work up No results for input(s): VITAMINB12, FOLATE, FERRITIN, TIBC, IRON, RETICCTPCT in the last 72 hours. Urinalysis    Component Value Date/Time   COLORURINE YELLOW 10/04/2016 1023   APPEARANCEUR CLOUDY (A) 10/04/2016 1023   LABSPEC 1.014 10/04/2016 1023   PHURINE 5.0 10/04/2016 1023   GLUCOSEU NEGATIVE 10/04/2016 1023   HGBUR LARGE (A) 10/04/2016 1023   BILIRUBINUR NEGATIVE 10/04/2016 1023   KETONESUR NEGATIVE 10/04/2016 1023   PROTEINUR 100 (A) 10/04/2016 1023   UROBILINOGEN 0.2 01/18/2015 2304   NITRITE NEGATIVE 10/04/2016 1023   LEUKOCYTESUR LARGE (A) 10/04/2016 1023   Sepsis Labs Invalid input(s): PROCALCITONIN,  WBC,  LACTICIDVEN Microbiology Recent Results (from the past 240 hour(s))  Urine culture     Status: Abnormal   Collection Time: 10/04/16 10:23 AM  Result Value Ref Range Status   Specimen Description  URINE, RANDOM  Final   Special Requests NONE  Final   Culture >=100,000 COLONIES/mL ESCHERICHIA COLI (A)  Final   Report Status 10/06/2016 FINAL  Final   Organism ID, Bacteria ESCHERICHIA COLI (A)  Final      Susceptibility   Escherichia coli - MIC*    AMPICILLIN <=2 SENSITIVE Sensitive     CEFAZOLIN <=4 SENSITIVE Sensitive     CEFTRIAXONE <=1 SENSITIVE Sensitive     CIPROFLOXACIN <=0.25 SENSITIVE Sensitive     GENTAMICIN <=1 SENSITIVE Sensitive     IMIPENEM <=0.25 SENSITIVE Sensitive  NITROFURANTOIN <=16 SENSITIVE Sensitive     TRIMETH/SULFA <=20 SENSITIVE Sensitive     AMPICILLIN/SULBACTAM <=2 SENSITIVE Sensitive     PIP/TAZO <=4 SENSITIVE Sensitive     Extended ESBL NEGATIVE Sensitive     * >=100,000 COLONIES/mL ESCHERICHIA COLI  Culture, blood (x 2)     Status: None (Preliminary result)   Collection Time: 10/04/16  6:12 PM  Result Value Ref Range Status   Specimen Description BLOOD LEFT ARM  Final   Special Requests IN PEDIATRIC BOTTLE Blood Culture adequate volume  Final   Culture   Final    NO GROWTH 3 DAYS Performed at [facility], [place] [address] [place], [place] [address]    Report Status PENDING  Incomplete  Culture, blood (x 2)     Status: None (Preliminary result)   Collection Time: 10/04/16  8:21 PM  Result Value Ref Range Status   Specimen Description BLOOD LEFT ARM  Final   Special Requests IN PEDIATRIC BOTTLE Blood Culture adequate volume  Final   Culture   Final    NO GROWTH 3 DAYS Performed at [facility], [place] [address], [place], [place] [address]    Report Status PENDING  Incomplete   Time coordinating discharge: 35 minutes  SIGNED:  Kerney Elbe, DO [facility] 10/08/2016, 11:53 AM Pager [phone]  If 7PM-7AM, please contact night-coverage [email or web address] Password [email or web address]

## 2016-10-08 NOTE — Progress Notes (Addendum)
ANTICOAGULATION CONSULT NOTE - Follow Up Consult  Pharmacy Consult for warfarin Indication:PMH DVT  No Known Allergies  Patient Measurements: Height: 5\' 1"  (154.9 cm) Weight: 129 lb 3 oz (58.6 kg) IBW/kg (Calculated) : 47.8   Vital Signs: Temp: 100 F (37.8 C) (06/12 0400) Temp Source: Oral (06/12 0400) BP: 128/68 (06/12 0400) Pulse Rate: 95 (06/12 0400)  Labs:  Recent Labs  10/06/16 0511 10/07/16 0541 10/08/16 0517  HGB 9.3* 9.0* 8.9*  HCT 28.6* 27.4* 26.9*  PLT 235 227 240  LABPROT 29.0* 22.6* 20.7*  INR 2.68 1.96 1.75  CREATININE 1.24* 1.08* 1.11*    Estimated Creatinine Clearance: 36 mL/min (A) (by C-G formula based on SCr of 1.11 mg/dL (H)).    Assessment: Pharmacy consulted to continue warfarin in this [age] yoF admitted 6/8 for recurrent falls and confusion. PMH DVT HTN, hypothyroid, Alzheimer's, CKD3.  Home warfarin regimen 5 mg daily, last dose 6/7  10/08/2016 INR 1.75, subtherapeutic. Pt likely subtherapeutic due to not having received med for several days while supratherapeutic Scr 1.11, CrCl ~ 36 (improving) H/H low but stable Plts WNL DI: none, also takes daily ASA 81mg . Pt dose of levothyroxine decreased from 112 mcg to 88 mcg starting 6/12   Goal of Therapy:  INR 2-3   Plan:  Warfarin 5 mg po x 1 at 1800 Daily INR May want to consider risks vs benefits of continuing warfarin in patient with recurrent falls (unless d/t an acute modifiable cause)    Adalberto ColeNikola Geovanie Winnett, PharmD, BCPS Pager [phone] 10/08/2016 7:34 AM

## 2016-10-08 NOTE — Clinical Social Work Placement (Signed)
Patient received and accepted [facility]. [facility], [facility] contacted. Per patient report family aware. Patient's RN can call report to [phone], room 318.  CLINICAL SOCIAL WORK PLACEMENT  NOTE  Date:  10/08/2016  Patient Details  Name: Norma Neal MRN: [id number] Date of Birth: 10/17/1940  Clinical Social Work is seeking post-discharge placement for this patient at the Skilled  Nursing Facility level of care (*CSW will initial, date and re-position this form in  chart as items are completed):  Yes   Patient/family provided with [place] Clinical Social Work Department's list of facilities offering this level of care within the geographic area requested by the patient (or if unable, by the patient's family).  Yes   Patient/family informed of their freedom to choose among providers that offer the needed level of care, that participate in Medicare, Medicaid or managed care program needed by the patient, have an available bed and are willing to accept the patient.  Yes   Patient/family informed of 's ownership interest in [facility] and [facility], as well as of the fact that they are under no obligation to receive care at these facilities.  PASRR submitted to EDS on       PASRR number received on       Existing PASRR number confirmed on 10/08/16     FL2 transmitted to all facilities in geographic area requested by pt/family on 10/08/16     FL2 transmitted to all facilities within larger geographic area on       Patient informed that his/her managed care company has contracts with or will negotiate with certain facilities, including the following:        Yes   Patient/family informed of bed offers received.  [facility]     Physician recommends and patient chooses bed at      Patient to be [facility] on 10/08/16.  Patient to be  transferred to facility by [facility]     Patient family notified on 10/08/16 of transfer.  Name of family member notified:  Patient reported that she has notified her son about transport.      PHYSICIAN       Additional Comment:    _______________________________________________ Antionette PolesKimberly L Oluwadamilola Rosamond, LCSW 10/08/2016, 2:20 PM

## 2016-10-08 NOTE — Progress Notes (Signed)
Pt will discharge to SNF/[facility].

## 2016-10-08 NOTE — Clinical Social Work Note (Signed)
Clinical Social Work Assessment  Patient Details  Name: Norma Neal MRN: [id number] Date of Birth: 06/03/1940  Date of referral:  10/08/16               Reason for consult:  Facility Placement                Permission sought to share information with:  Ocean[facility]ility Contact Representative Permission granted to share information::  Yes, Verbal Permission Granted  Name::        Agency::     Relationship::     Contact Information:     Housing/Transportation Living arrangements for the past 2 months:  Single Family Home Source of Information:  Patient, Other (Comment Required) ([facility] social worker Dispensing optician([name])) Patient Interpreter Needed:  None Criminal Activity/Legal Involvement Pertinent to Current Situation/Hospitalization:  No - Comment as needed Significant Relationships:  Adult Children Lives with:  Adult Children Do you feel safe going back to the place where you live?  Yes Need for family participation in patient care:  No (Coment)  Care giving concerns:  CSW contacted and informed by patient's [facility] social worker that patient is interested in going to ST rehab at a SNF. Patient resides in a two story home and her bedroom and bathroom are on the second level, patient experienced a fall down the stairs. [facility] SW reported that their therapists want patient to get some ST rehab prior to returning to her previous living environment.    Social Worker assessment / plan:  CSW spoke with patient at bedside, patient alert and oriented x 4. Patient reported that [facility] Patient reported that she has a [facility] and would prefer to go there. Patient reported that she resides with her son and his family. CSW completed FL2 and will assist patient with discharge planning to [facility].   Employment status:  Unemployed Health and safety inspectornsurance information:  Other (Comment Required) ([facility]) PT Recommendations:  Home with Home  Health Information / Referral to community resources:  Skilled Nursing Facility  Patient/Family's Response to care:  Patient [facility].  Patient/Family's Understanding of and Emotional Response to Diagnosis, Current Treatment, and Prognosis:  Patient initially presented [facility]ive [facility] and then agreed. Patient reported that her goal is to return back to her home after ST rehab. Patient [facility].   Emotional Assessment Appearance:  Appears stated age Attitude/Demeanor/Rapport:  Apprehensive Affect (typically observed):  Calm Orientation:  Oriented to Self, Oriented to Place, Oriented to  Time, Oriented to Situation Alcohol / Substance use:  Not Applicable Psych involvement (Current and /or in the community):  No (Comment)  Discharge Needs  Concerns to be addressed:  No discharge needs identified Readmission within the last 30 days:  No Current discharge risk:  None Barriers to Discharge:  No Barriers Identified   [name], LCSW 10/08/2016, 8:48 AM

## 2016-10-09 LAB — CULTURE, BLOOD (ROUTINE X 2)
Culture: NO GROWTH
Culture: NO GROWTH
SPECIAL REQUESTS: ADEQUATE
Special Requests: ADEQUATE

## 2017-05-12 ENCOUNTER — Ambulatory Visit (HOSPITAL_COMMUNITY)
Admission: RE | Admit: 2017-05-12 | Discharge: 2017-05-12 | Disposition: A | Discharge status: Home / Self Care | Payer: Medicare (Managed Care) | Source: Ambulatory Visit | Attending: Family Medicine | Admitting: Family Medicine

## 2017-05-12 ENCOUNTER — Other Ambulatory Visit (HOSPITAL_COMMUNITY): Payer: Self-pay | Admitting: Family Medicine

## 2017-05-12 DIAGNOSIS — Z8673 Personal history of transient ischemic attack (TIA), and cerebral infarction without residual deficits: Secondary | ICD-10-CM | POA: Diagnosis not present

## 2017-05-12 DIAGNOSIS — I739 Peripheral vascular disease, unspecified: Secondary | ICD-10-CM | POA: Diagnosis not present

## 2017-05-12 DIAGNOSIS — R4182 Altered mental status, unspecified: Secondary | ICD-10-CM | POA: Diagnosis present

## 2017-05-12 DIAGNOSIS — I749 Embolism and thrombosis of unspecified artery: Secondary | ICD-10-CM

## 2017-05-12 DIAGNOSIS — I63139 Cerebral infarction due to embolism of unspecified carotid artery: Secondary | ICD-10-CM

## 2017-05-16 ENCOUNTER — Ambulatory Visit (HOSPITAL_BASED_OUTPATIENT_CLINIC_OR_DEPARTMENT_OTHER)
Admission: RE | Admit: 2017-05-16 | Discharge: 2017-05-16 | Disposition: A | Discharge status: Home / Self Care | Payer: Medicare (Managed Care) | Source: Ambulatory Visit | Attending: Family Medicine | Admitting: Family Medicine

## 2017-05-16 ENCOUNTER — Ambulatory Visit (HOSPITAL_COMMUNITY)
Admission: RE | Admit: 2017-05-16 | Discharge: 2017-05-16 | Disposition: A | Discharge status: Home / Self Care | Payer: Medicare (Managed Care) | Source: Ambulatory Visit | Attending: Family Medicine | Admitting: Family Medicine

## 2017-05-16 DIAGNOSIS — I6523 Occlusion and stenosis of bilateral carotid arteries: Secondary | ICD-10-CM | POA: Insufficient documentation

## 2017-05-16 DIAGNOSIS — I253 Aneurysm of heart: Secondary | ICD-10-CM | POA: Diagnosis not present

## 2017-05-16 DIAGNOSIS — I420 Dilated cardiomyopathy: Secondary | ICD-10-CM | POA: Diagnosis not present

## 2017-05-16 DIAGNOSIS — I679 Cerebrovascular disease, unspecified: Secondary | ICD-10-CM | POA: Diagnosis present

## 2017-05-16 DIAGNOSIS — I749 Embolism and thrombosis of unspecified artery: Secondary | ICD-10-CM | POA: Diagnosis not present

## 2017-05-16 DIAGNOSIS — I63139 Cerebral infarction due to embolism of unspecified carotid artery: Secondary | ICD-10-CM | POA: Diagnosis not present

## 2017-05-16 DIAGNOSIS — I5030 Unspecified diastolic (congestive) heart failure: Secondary | ICD-10-CM | POA: Diagnosis not present

## 2017-05-16 DIAGNOSIS — I10 Essential (primary) hypertension: Secondary | ICD-10-CM | POA: Diagnosis present

## 2017-05-16 NOTE — Progress Notes (Addendum)
  Echocardiogram 2D Echocardiogram has been performed.  Technically difficult due to patient non compliance. Patient unable to hold breath and clenching arm to chest.    [name] 05/16/2017, 3:02 PM

## 2017-05-16 NOTE — Progress Notes (Signed)
Carotid artery duplex has been completed. 1-39% ICA stenosis bilaterally.  05/16/17 3:32 PM Olen CordialGreg Derik Fults RVT

## 2017-06-18 ENCOUNTER — Emergency Department (HOSPITAL_COMMUNITY)
Admission: EM | Admit: 2017-06-18 | Discharge: 2017-06-18 | Disposition: A | Discharge status: Home / Self Care | Payer: Medicare (Managed Care) | Attending: Emergency Medicine | Admitting: Emergency Medicine

## 2017-06-18 ENCOUNTER — Encounter (HOSPITAL_COMMUNITY): Payer: Self-pay | Admitting: Emergency Medicine

## 2017-06-18 ENCOUNTER — Other Ambulatory Visit: Payer: Self-pay

## 2017-06-18 ENCOUNTER — Emergency Department (HOSPITAL_COMMUNITY): Payer: Medicare (Managed Care)

## 2017-06-18 DIAGNOSIS — R404 Transient alteration of awareness: Secondary | ICD-10-CM

## 2017-06-18 DIAGNOSIS — R05 Cough: Secondary | ICD-10-CM | POA: Insufficient documentation

## 2017-06-18 DIAGNOSIS — J449 Chronic obstructive pulmonary disease, unspecified: Secondary | ICD-10-CM | POA: Insufficient documentation

## 2017-06-18 DIAGNOSIS — F0390 Unspecified dementia without behavioral disturbance: Secondary | ICD-10-CM | POA: Insufficient documentation

## 2017-06-18 DIAGNOSIS — Z79899 Other long term (current) drug therapy: Secondary | ICD-10-CM | POA: Diagnosis not present

## 2017-06-18 DIAGNOSIS — R059 Cough, unspecified: Secondary | ICD-10-CM

## 2017-06-18 DIAGNOSIS — Z8673 Personal history of transient ischemic attack (TIA), and cerebral infarction without residual deficits: Secondary | ICD-10-CM

## 2017-06-18 DIAGNOSIS — Z7901 Long term (current) use of anticoagulants: Secondary | ICD-10-CM | POA: Diagnosis not present

## 2017-06-18 DIAGNOSIS — Z7982 Long term (current) use of aspirin: Secondary | ICD-10-CM | POA: Insufficient documentation

## 2017-06-18 DIAGNOSIS — M79601 Pain in right arm: Secondary | ICD-10-CM

## 2017-06-18 DIAGNOSIS — E1140 Type 2 diabetes mellitus with diabetic neuropathy, unspecified: Secondary | ICD-10-CM | POA: Insufficient documentation

## 2017-06-18 DIAGNOSIS — R4182 Altered mental status, unspecified: Secondary | ICD-10-CM | POA: Diagnosis present

## 2017-06-18 DIAGNOSIS — I129 Hypertensive chronic kidney disease with stage 1 through stage 4 chronic kidney disease, or unspecified chronic kidney disease: Secondary | ICD-10-CM | POA: Insufficient documentation

## 2017-06-18 DIAGNOSIS — N183 Chronic kidney disease, stage 3 (moderate): Secondary | ICD-10-CM | POA: Insufficient documentation

## 2017-06-18 LAB — URINALYSIS, ROUTINE W REFLEX MICROSCOPIC
BILIRUBIN URINE: NEGATIVE
Glucose, UA: NEGATIVE mg/dL
Hgb urine dipstick: NEGATIVE
Ketones, ur: NEGATIVE mg/dL
LEUKOCYTES UA: NEGATIVE
NITRITE: NEGATIVE
Protein, ur: NEGATIVE mg/dL
Specific Gravity, Urine: 1.014 (ref 1.005–1.030)
pH: 8 (ref 5.0–8.0)

## 2017-06-18 LAB — CBC WITH DIFFERENTIAL/PLATELET
BASOS ABS: 0 10*3/uL (ref 0.0–0.1)
BASOS PCT: 0 %
EOS ABS: 0.2 10*3/uL (ref 0.0–0.7)
Eosinophils Relative: 3 %
HEMATOCRIT: 35.2 % — AB (ref 36.0–46.0)
Hemoglobin: 11.3 g/dL — ABNORMAL LOW (ref 12.0–15.0)
Lymphocytes Relative: 26 %
Lymphs Abs: 1.5 10*3/uL (ref 0.7–4.0)
MCH: 29.5 pg (ref 26.0–34.0)
MCHC: 32.1 g/dL (ref 30.0–36.0)
MCV: 91.9 fL (ref 78.0–100.0)
Monocytes Absolute: 0.3 10*3/uL (ref 0.1–1.0)
Monocytes Relative: 5 %
NEUTROS ABS: 3.7 10*3/uL (ref 1.7–7.7)
Neutrophils Relative %: 66 %
PLATELETS: 193 10*3/uL (ref 150–400)
RBC: 3.83 MIL/uL — ABNORMAL LOW (ref 3.87–5.11)
RDW: 14.3 % (ref 11.5–15.5)
WBC: 5.6 10*3/uL (ref 4.0–10.5)

## 2017-06-18 LAB — COMPREHENSIVE METABOLIC PANEL
ALBUMIN: 3.8 g/dL (ref 3.5–5.0)
ALT: 14 U/L (ref 14–54)
ANION GAP: 11 (ref 5–15)
AST: 18 U/L (ref 15–41)
Alkaline Phosphatase: 68 U/L (ref 38–126)
BILIRUBIN TOTAL: 0.4 mg/dL (ref 0.3–1.2)
BUN: 16 mg/dL (ref 6–20)
CHLORIDE: 110 mmol/L (ref 101–111)
CO2: 23 mmol/L (ref 22–32)
Calcium: 9.3 mg/dL (ref 8.9–10.3)
Creatinine, Ser: 1.22 mg/dL — ABNORMAL HIGH (ref 0.44–1.00)
GFR calc Af Amer: 49 mL/min — ABNORMAL LOW (ref >60)
GFR calc non Af Amer: 42 mL/min — ABNORMAL LOW (ref >60)
GLUCOSE: 109 mg/dL — AB (ref 65–99)
POTASSIUM: 4 mmol/L (ref 3.5–5.1)
SODIUM: 144 mmol/L (ref 135–145)
TOTAL PROTEIN: 6.8 g/dL (ref 6.5–8.1)

## 2017-06-18 LAB — PROTIME-INR
INR: 0.98
Prothrombin Time: 12.9 seconds (ref 11.4–15.2)

## 2017-06-18 LAB — I-STAT TROPONIN, ED: Troponin i, poc: 0.01 ng/mL (ref 0.00–0.08)

## 2017-06-18 LAB — CBG MONITORING, ED: Glucose-Capillary: 103 mg/dL — ABNORMAL HIGH (ref 65–99)

## 2017-06-18 LAB — I-STAT CG4 LACTIC ACID, ED: LACTIC ACID, VENOUS: 1.86 mmol/L (ref 0.5–1.9)

## 2017-06-18 MED ORDER — ACETAMINOPHEN 500 MG PO TABS
1000.0000 mg | ORAL_TABLET | Freq: Once | ORAL | Status: AC
  Administered 2017-06-18: 1000 mg via ORAL
  Filled 2017-06-18: qty 2

## 2017-06-18 MED ORDER — CYCLOBENZAPRINE HCL 10 MG PO TABS
10.0000 mg | ORAL_TABLET | Freq: Once | ORAL | Status: AC
Start: 2017-06-18 — End: 2017-06-18
  Administered 2017-06-18: 10 mg via ORAL
  Filled 2017-06-18: qty 1

## 2017-06-18 MED ORDER — CYCLOBENZAPRINE HCL 5 MG PO TABS: 5.0000 mg | ORAL_TABLET | Freq: Three times a day (TID) | ORAL | 0 refills | Status: DC | PRN

## 2017-06-18 NOTE — ED Provider Notes (Addendum)
[facility]   CSN: [id number] Arrival date & time: 06/18/17  0915     History   Chief Complaint Chief Complaint  Patient presents with  . Altered Mental Status    HPI Norma Neal is a 77 y.o. female.  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  Per son, home health knocked on the door this AM and told him she was concerned as patient was difficult to wake up this morning, and did not wake to her for 20 minutes.  Initially there was question of new left sided weakness but son reports she was diagnosed with stroke several weeks ago (1/14) with left sided weakness and went to [facility].  Denies new left sided weakness or other acute neurologic concerns.  She is currently at baseline. Report she has figiting movements which are chronic and unchanged-and is noted to have drug induced akathisia on her problem list. No change in this.  Reports she is not always oriented to days.  No other acute concerns recently including no fever, cough, congestion, dyspnea, chest pain, urinary symptoms. Pt reports she has chronic abdominal pain and had nausea and vomiting last night.  Reports right arm pain. Points to abdominal pain as periumbilical, and she says it has not changed. Has had cough for last few days.  Arm pain just started this AM. No diarrhea or other concerns.  Past Medical History:  Diagnosis Date  . Alzheimer's disease   . Anemia of other chronic disease   . Chronic kidney disease (CKD), stage III (moderate) (HCC)   . Chronic obstructive asthma, unspecified   . Dementia   . Diabetes mellitus   . Diabetic neuropathy (HCC)   . Drug induced akathisia   . Esophageal reflux   . Hypertension   . Insomnia   . Lichen planus   . 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  Nontoxic multinodular goiter   . Psychotic depression (HCC)   . Unspecified hypothyroidism   . Unspecified vitamin D deficiency     Patient Active Problem List   Diagnosis Date Noted  . Fall at home, initial encounter 10/04/2016  . Acute respiratory failure with hypoxia (HCC) 10/04/2016  . Leukocytosis 10/04/2016  . Hypokalemia 10/04/2016  . Acute lower UTI 10/04/2016  . Alzheimer's disease 08/18/2014  . Hypothyroidism 08/18/2014  . Other specified transient cerebral ischemias   . CKD (chronic kidney disease) stage 3, GFR 30-59 ml/min (HCC)   . Anemia of chronic disease 08/17/2014    Past Surgical History:  Procedure Laterality Date  . ABDOMINAL HYSTERECTOMY  yrs ago   ovaries also  . ESOPHAGOGASTRODUODENOSCOPY (EGD) WITH PROPOFOL N/A 03/31/2014   Procedure: ESOPHAGOGASTRODUODENOSCOPY (EGD) WITH PROPOFOL;  Surgeon: Rachael Feeaniel P Jacobs, MD;  Location: [facility];  Service: Endoscopy;  Laterality: N/A;  . KNEE SURGERY Left yrs ago  . partial thryoidectomy  yrs ago    OB History    No data available       Home Medications    Prior to Admission medications   Medication Sig Start Date End Date Taking? Authorizing Provider  albuterol (PROVENTIL HFA;VENTOLIN HFA) 108 (90 BASE) MCG/ACT inhaler Inhale 1-2 puffs into the lungs every 6 (six) hours as needed for wheezing or shortness of breath.   Yes [provider]  albuterol (PROVENTIL) (2.5  MG/3ML) 0.083% nebulizer solution Take 2.5 mg by nebulization every 6 (six) hours as needed for wheezing or shortness of breath.   Yes [provider]  amLODipine (NORVASC) 10 MG tablet Take 10 mg by mouth daily.   Yes [provider]  aspirin EC 81 MG tablet Take 81 mg by mouth daily.   Yes [provider]  brimonidine (ALPHAGAN) 0.15 % ophthalmic solution Place 1 drop into both eyes at bedtime.    Yes [provider]  carvedilol (COREG) 3.125 MG tablet Take 1 tablet (3.125 mg total) by mouth 2 (two)  times daily with a meal. 01/23/15  Yes Rodolph Bong, MD  cetirizine (ZYRTEC) 10 MG tablet Take 10 mg by mouth daily.   Yes [provider]  cholecalciferol (VITAMIN D) 1000 UNITS tablet Take 1,000 Units by mouth daily.   Yes [provider]  docusate sodium (COLACE) 100 MG capsule Take 1 capsule (100 mg total) by mouth 2 (two) times daily as needed for mild constipation. 10/08/16  Yes Sheikh, Omair Latif, DO  donepezil (ARICEPT) 10 MG tablet Take 10 mg by mouth at bedtime.   Yes [provider]  escitalopram (LEXAPRO) 10 MG tablet Take 10 mg by mouth daily.   Yes [provider]  esomeprazole (NEXIUM) 40 MG capsule Take 1 capsule (40 mg total) by mouth 2 (two) times daily before a meal. 04/17/16  Yes Esterwood, Amy S, PA-C  gabapentin (NEURONTIN) 300 MG capsule Take 300 mg by mouth at bedtime.   Yes [provider]  latanoprost (XALATAN) 0.005 % ophthalmic solution Place 1 drop into both eyes at bedtime.   Yes [provider]  levothyroxine (SYNTHROID, LEVOTHROID) 88 MCG tablet Take 1 tablet (88 mcg total) by mouth daily before breakfast. 10/09/16  Yes Sheikh, Omair Latif, DO  mirtazapine (REMERON) 30 MG tablet Take 30 mg by mouth at bedtime.   Yes [provider]  ondansetron (ZOFRAN) 4 MG tablet Take 1 tablet (4 mg total) by mouth every 6 (six) hours as needed for nausea. 10/08/16  Yes Sheikh, Omair Latif, DO  ranitidine (ZANTAC) 150 MG tablet Take 150 mg by mouth at bedtime.   Yes [provider]  sodium bicarbonate 650 MG tablet Take 650 mg by mouth 2 (two) times daily.   Yes [provider]  traMADol (ULTRAM) 50 MG tablet Take 1 tablet (50 mg total) by mouth every 6 (six) hours as needed for moderate pain. 10/08/16  Yes Sheikh, Omair Latif, DO  cefUROXime (CEFTIN) 250 MG tablet Take 1 tablet (250 mg total) by mouth 2 (two) times daily with a meal. Patient not taking: Reported on 06/18/2017 10/09/16   Marguerita Merles  Latif, DO  cyclobenzaprine (FLEXERIL) 5 MG tablet Take 1 tablet (5 mg total) by mouth 3 (three) times daily as needed for muscle spasms. 06/18/17   Alvira Monday, MD  warfarin (COUMADIN) 5 MG tablet Take 5 mg by mouth daily. 09/26/16   [provider]    Family History Family History  Problem Relation Age of Onset  . Cancer Brother        lung  . Cancer Sister        ?  Marland Kitchen Breast cancer Sister        x2  . Heart attack Sister   . Breast cancer Cousin        Neice    Social History Social History   Tobacco Use  . Smoking status: Never Smoker  .  Smokeless tobacco: Never Used  Substance Use Topics  . Alcohol use: No    Alcohol/week: 0.0 oz  . Drug use: No     Allergies   Patient has no known allergies.   Review of Systems Review of Systems  Constitutional: Positive for fatigue. Negative for fever.  HENT: Negative for sore throat.   Eyes: Negative for visual disturbance.  Respiratory: Negative for cough and shortness of breath.   Cardiovascular: Negative for chest pain.  Gastrointestinal: Positive for abdominal pain (chronic per pt), nausea and vomiting. Negative for constipation and diarrhea.  Genitourinary: Negative for difficulty urinating.  Musculoskeletal: Positive for arthralgias. Negative for back pain and neck pain.  Skin: Negative for rash.  Neurological: Negative for syncope and headaches. Weakness: left sided unchanged since CVA 3 weeks ago.     Physical Exam Updated Vital Signs BP (!) 155/97 (BP Location: Left Arm)   Pulse 73   Temp 98.2 F (36.8 C) (Oral)   Resp 16   Ht 5\' 1"  (1.549 m)   Wt 59 kg (130 lb)   SpO2 98%   BMI 24.56 kg/m   Physical Exam  Constitutional: She appears well-developed and well-nourished. No distress.  HENT:  Head: Normocephalic and atraumatic.  Eyes: Conjunctivae and EOM are normal.  Neck: Normal range of motion.  Cardiovascular: Normal rate, regular rhythm, normal heart sounds and intact distal pulses.  Exam reveals no gallop and no friction rub.  No murmur heard. Pulmonary/Chest: Effort normal and breath sounds normal. No respiratory distress. She has no wheezes. She has no rales.  Abdominal: Soft. She exhibits no distension. There is no tenderness. There is no guarding.  Musculoskeletal: She exhibits no edema.       Right shoulder: She exhibits tenderness (right upper arm/shoulder). She exhibits no bony tenderness.  Neurological: She is alert.  Akathisia Left upper arm and leg with 4/5 weakness in comparison to right Normal sensation, normal cranial nerves  Oriented to self, location, unable to state date  Skin: Skin is warm and dry. No rash noted. She is not diaphoretic. No erythema.  Nursing note and vitals reviewed.    ED Treatments / Results  Labs (all labs ordered are listed, but only abnormal results are displayed) Labs Reviewed  CBC WITH DIFFERENTIAL/PLATELET - Abnormal; Notable for the following components:      Result Value   RBC 3.83 (*)    Hemoglobin 11.3 (*)    HCT 35.2 (*)    All other components within normal limits  COMPREHENSIVE METABOLIC PANEL - Abnormal; Notable for the following components:   Glucose, Bld 109 (*)    Creatinine, Ser 1.22 (*)    GFR calc non Af Amer 42 (*)    GFR calc Af Amer 49 (*)    All other components within normal limits  URINALYSIS, ROUTINE W REFLEX MICROSCOPIC - Abnormal; Notable for the following components:   Color, Urine STRAW (*)    All other components within normal limits  CBG MONITORING, ED - Abnormal; Notable for the following components:   Glucose-Capillary 103 (*)    All other components within normal limits  URINE CULTURE  PROTIME-INR  I-STAT CG4 LACTIC ACID, ED  I-STAT TROPONIN, ED    EKG  EKG Interpretation  Date/Time:  Wednesday June 18 2017 09:45:40 EST Ventricular Rate:  93 PR Interval:    QRS Duration: 82 QT Interval:  432 QTC Calculation: 483 R Axis:   53 Text Interpretation:  Sinus rhythm  Multiform ventricular premature complexes  Borderline T abnormalities, inferior leads Minimal ST elevation, inferior leads Baseline wander in lead(s) II III aVR aVL aVF V4 No significant change since last tracing Confirmed by Alvira Monday ([id number]) on 06/18/2017 11:29:46 AM       Radiology Dg Chest 2 View  Result Date: 06/18/2017 CLINICAL DATA:  Altered mental status with left-sided weakness EXAM: CHEST  2 VIEW COMPARISON:  October 04, 2016 FINDINGS: There is no edema or consolidation. Heart is mildly enlarged with pulmonary vascularity within normal limits. There is aortic atherosclerosis. No adenopathy. There is leftward deviation of the upper thoracic trachea, a finding also present on previous study. There is degenerative change in the thoracic spine and in each shoulder. IMPRESSION: No edema or consolidation. Stable cardiac prominence. There is aortic atherosclerosis. Leftward deviation of the upper thoracic trachea is likely due to thyroid enlargement on the right. Aortic Atherosclerosis (ICD10-I70.0). Electronically Signed   By: Bretta Bang III M.D.   On: 06/18/2017 11:47    Procedures Procedures (including critical care time)  Medications Ordered in ED Medications  cyclobenzaprine (FLEXERIL) tablet 10 mg (10 mg Oral Given 06/18/17 1357)  acetaminophen (TYLENOL) tablet 1,000 mg (1,000 mg Oral Given 06/18/17 1357)     Initial Impression / Assessment and Plan / ED Course  I have reviewed the triage vital signs and the nursing notes.  Pertinent labs & imaging results that were available during my care of the patient were reviewed by me and considered in my medical decision making (see chart for details).      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  No headache, no trauma, no new focal neurologic changes and doubt acute CVA  or intracranial hemorrhage. No chest pain or dyspnea and no acute findings on CXR, ekg, and troponin negative. No signs of acute bacterial infection.  Patient remains with stable vital signs, alert and at baseline in the ED.  Question whether sleepiness this morning was in the setting of viral URI, or lower likelihood she had an unwitnessed seizure and was post ictal.  She has bene at baseline in the ED and do not see other emergent pathology by history, exam or testing.  She reports right proximal pain, and is noted to have tenderness.  No abdominal tenderness, no distention, no signs of acute cholecystitis, appendicitis, obstruction. Doubt other intrathoracic etiology of arm pain. Suspect likely msk etiology.  Patient had diagnosis of stroke 1/14 and from what I can see in records work up including ECHO and carotid US. I attempted to contact [facility] but was unable to get a hold of them and did not hear back after leaving a message. It appears she has had management through the rehab facility for her CVA at that time and I assume she is seeing Neurologist as outpatient although family is not sure of name. Akathisia appears chronic by hx provided by family-unclear if they have attempted to stop other medications as outpt which may contribute. Discussed importance of continued neurology follow up and discussed reasons to return. Given rx for flexeril 5mg  for arm pain. Patient discharged in stable condition with understanding of reasons to return.   Final Clinical Impressions(s) / ED Diagnoses   Final diagnoses:  Cough  Right arm pain  History of CVA (cerebrovascular accident)  Altered awareness, transient    ED Discharge Orders  Ordered    cyclobenzaprine (FLEXERIL) 5 MG tablet  3 times daily PRN     06/18/17 1242       Alvira Monday, MD 06/18/17 2119    Alvira Monday, MD 06/18/17 2120

## 2017-06-18 NOTE — ED Notes (Signed)
Patient transported to X-ray 

## 2017-06-18 NOTE — ED Triage Notes (Addendum)
Pt arrived from home via [facility]. Stated that home health aide seeing pt today was concerned about AMS, stated she was confused with left sided weakness. Pt has hx of dementia and CVA affecting right side of brain on 05/12/17. Pt also complains of R arm pain.  Pt hypertensive on arrival; CBG 113 in ambulance

## 2017-06-19 LAB — URINE CULTURE

## 2017-11-27 ENCOUNTER — Emergency Department (HOSPITAL_COMMUNITY)
Admission: EM | Admit: 2017-11-27 | Discharge: 2017-11-28 | Disposition: A | Discharge status: Other Acute Inpatient Hospital | Payer: Medicare Other | Attending: Emergency Medicine | Admitting: Emergency Medicine

## 2017-11-27 ENCOUNTER — Encounter (HOSPITAL_COMMUNITY): Payer: Self-pay | Admitting: Obstetrics and Gynecology

## 2017-11-27 ENCOUNTER — Other Ambulatory Visit: Payer: Self-pay

## 2017-11-27 DIAGNOSIS — N183 Chronic kidney disease, stage 3 (moderate): Secondary | ICD-10-CM | POA: Insufficient documentation

## 2017-11-27 DIAGNOSIS — R197 Diarrhea, unspecified: Secondary | ICD-10-CM | POA: Insufficient documentation

## 2017-11-27 DIAGNOSIS — F0280 Dementia in other diseases classified elsewhere without behavioral disturbance: Secondary | ICD-10-CM | POA: Insufficient documentation

## 2017-11-27 DIAGNOSIS — Z79899 Other long term (current) drug therapy: Secondary | ICD-10-CM | POA: Insufficient documentation

## 2017-11-27 DIAGNOSIS — Z046 Encounter for general psychiatric examination, requested by authority: Secondary | ICD-10-CM | POA: Diagnosis not present

## 2017-11-27 DIAGNOSIS — Z7901 Long term (current) use of anticoagulants: Secondary | ICD-10-CM | POA: Insufficient documentation

## 2017-11-27 DIAGNOSIS — E1122 Type 2 diabetes mellitus with diabetic chronic kidney disease: Secondary | ICD-10-CM | POA: Insufficient documentation

## 2017-11-27 DIAGNOSIS — Z7982 Long term (current) use of aspirin: Secondary | ICD-10-CM | POA: Insufficient documentation

## 2017-11-27 DIAGNOSIS — R45851 Suicidal ideations: Secondary | ICD-10-CM | POA: Insufficient documentation

## 2017-11-27 DIAGNOSIS — G309 Alzheimer's disease, unspecified: Secondary | ICD-10-CM | POA: Diagnosis not present

## 2017-11-27 DIAGNOSIS — I129 Hypertensive chronic kidney disease with stage 1 through stage 4 chronic kidney disease, or unspecified chronic kidney disease: Secondary | ICD-10-CM | POA: Insufficient documentation

## 2017-11-27 DIAGNOSIS — E039 Hypothyroidism, unspecified: Secondary | ICD-10-CM | POA: Diagnosis not present

## 2017-11-27 DIAGNOSIS — R0981 Nasal congestion: Secondary | ICD-10-CM | POA: Diagnosis not present

## 2017-11-27 DIAGNOSIS — F329 Major depressive disorder, single episode, unspecified: Secondary | ICD-10-CM | POA: Insufficient documentation

## 2017-11-27 DIAGNOSIS — F32A Depression, unspecified: Secondary | ICD-10-CM

## 2017-11-27 LAB — CBC
HCT: 34.6 % — ABNORMAL LOW (ref 36.0–46.0)
Hemoglobin: 11.3 g/dL — ABNORMAL LOW (ref 12.0–15.0)
MCH: 29.4 pg (ref 26.0–34.0)
MCHC: 32.7 g/dL (ref 30.0–36.0)
MCV: 90.1 fL (ref 78.0–100.0)
Platelets: 210 10*3/uL (ref 150–400)
RBC: 3.84 MIL/uL — ABNORMAL LOW (ref 3.87–5.11)
RDW: 14.4 % (ref 11.5–15.5)
WBC: 5.5 10*3/uL (ref 4.0–10.5)

## 2017-11-27 LAB — COMPREHENSIVE METABOLIC PANEL
ALBUMIN: 4.1 g/dL (ref 3.5–5.0)
ALT: 13 U/L (ref 0–44)
ANION GAP: 6 (ref 5–15)
AST: 16 U/L (ref 15–41)
Alkaline Phosphatase: 60 U/L (ref 38–126)
BUN: 20 mg/dL (ref 8–23)
CALCIUM: 9.2 mg/dL (ref 8.9–10.3)
CO2: 23 mmol/L (ref 22–32)
CREATININE: 1.48 mg/dL — AB (ref 0.44–1.00)
Chloride: 116 mmol/L — ABNORMAL HIGH (ref 98–111)
GFR calc Af Amer: 38 mL/min — ABNORMAL LOW (ref >60)
GFR calc non Af Amer: 33 mL/min — ABNORMAL LOW (ref >60)
GLUCOSE: 108 mg/dL — AB (ref 70–99)
Potassium: 4 mmol/L (ref 3.5–5.1)
SODIUM: 145 mmol/L (ref 135–145)
Total Bilirubin: 0.4 mg/dL (ref 0.3–1.2)
Total Protein: 7.4 g/dL (ref 6.5–8.1)

## 2017-11-27 LAB — SALICYLATE LEVEL: Salicylate Lvl: <7 mg/dL (ref 2.8–30.0)

## 2017-11-27 LAB — ETHANOL: Alcohol, Ethyl (B): <10 mg/dL (ref <10)

## 2017-11-27 LAB — RAPID URINE DRUG SCREEN, HOSP PERFORMED
Amphetamines: NOT DETECTED
BENZODIAZEPINES: NOT DETECTED
Barbiturates: NOT DETECTED
Cocaine: NOT DETECTED
OPIATES: NOT DETECTED
Tetrahydrocannabinol: NOT DETECTED

## 2017-11-27 LAB — ACETAMINOPHEN LEVEL

## 2017-11-27 MED ORDER — BRIMONIDINE TARTRATE 0.2 % OP SOLN
1.0000 [drp] | Freq: Two times a day (BID) | OPHTHALMIC | Status: DC
  Administered 2017-11-27 – 2017-11-28 (×2): 1 [drp] via OPHTHALMIC
  Filled 2017-11-27: qty 5

## 2017-11-27 MED ORDER — MIRTAZAPINE 7.5 MG PO TABS
7.5000 mg | ORAL_TABLET | Freq: Every day | ORAL | Status: DC
  Administered 2017-11-27: 7.5 mg via ORAL
  Filled 2017-11-27 (×2): qty 1

## 2017-11-27 MED ORDER — DONEPEZIL HCL 5 MG PO TABS
10.0000 mg | ORAL_TABLET | Freq: Every day | ORAL | Status: DC
  Administered 2017-11-27: 10 mg via ORAL
  Filled 2017-11-27: qty 2

## 2017-11-27 MED ORDER — IPRATROPIUM BROMIDE 0.03 % NA SOLN
2.0000 | Freq: Three times a day (TID) | NASAL | Status: DC
  Administered 2017-11-27 – 2017-11-28 (×3): 2 via NASAL
  Filled 2017-11-27: qty 30

## 2017-11-27 MED ORDER — ESCITALOPRAM OXALATE 10 MG PO TABS
20.0000 mg | ORAL_TABLET | Freq: Every day | ORAL | Status: DC
  Administered 2017-11-28: 20 mg via ORAL
  Filled 2017-11-27: qty 2

## 2017-11-27 MED ORDER — FAMOTIDINE 20 MG PO TABS
20.0000 mg | ORAL_TABLET | Freq: Two times a day (BID) | ORAL | Status: DC
  Administered 2017-11-27 – 2017-11-28 (×2): 20 mg via ORAL
  Filled 2017-11-27 (×2): qty 1

## 2017-11-27 MED ORDER — VITAMIN D3 25 MCG (1000 UNIT) PO TABS
1000.0000 [IU] | ORAL_TABLET | Freq: Every day | ORAL | Status: DC
  Administered 2017-11-28: 1000 [IU] via ORAL
  Filled 2017-11-27: qty 1

## 2017-11-27 MED ORDER — LATANOPROST 0.005 % OP SOLN
1.0000 [drp] | Freq: Every day | OPHTHALMIC | Status: DC
  Administered 2017-11-27: 1 [drp] via OPHTHALMIC
  Filled 2017-11-27: qty 2.5

## 2017-11-27 MED ORDER — METOPROLOL SUCCINATE ER 50 MG PO TB24
50.0000 mg | ORAL_TABLET | Freq: Every day | ORAL | Status: DC
  Administered 2017-11-27: 50 mg via ORAL
  Filled 2017-11-27 (×2): qty 1

## 2017-11-27 MED ORDER — LEVOTHYROXINE SODIUM 50 MCG PO TABS
50.0000 ug | ORAL_TABLET | Freq: Every day | ORAL | Status: DC
  Administered 2017-11-28: 50 ug via ORAL
  Filled 2017-11-27 (×2): qty 1

## 2017-11-27 MED ORDER — MELATONIN 5 MG PO TABS
5.0000 mg | ORAL_TABLET | Freq: Every day | ORAL | Status: DC
  Administered 2017-11-27: 5 mg via ORAL
  Filled 2017-11-27 (×2): qty 1

## 2017-11-27 MED ORDER — BRIMONIDINE TARTRATE-TIMOLOL 0.2-0.5 % OP SOLN: 1.0000 [drp] | Freq: Two times a day (BID) | OPHTHALMIC | Status: DC

## 2017-11-27 MED ORDER — ASPIRIN EC 81 MG PO TBEC
81.0000 mg | DELAYED_RELEASE_TABLET | Freq: Every day | ORAL | Status: DC
  Administered 2017-11-28: 81 mg via ORAL
  Filled 2017-11-27: qty 1

## 2017-11-27 MED ORDER — TIMOLOL MALEATE 0.5 % OP SOLN
1.0000 [drp] | Freq: Two times a day (BID) | OPHTHALMIC | Status: DC
  Administered 2017-11-27 – 2017-11-28 (×2): 1 [drp] via OPHTHALMIC
  Filled 2017-11-27: qty 5

## 2017-11-27 MED ORDER — PANTOPRAZOLE SODIUM 40 MG PO TBEC
40.0000 mg | DELAYED_RELEASE_TABLET | Freq: Every day | ORAL | Status: DC
  Administered 2017-11-28: 40 mg via ORAL
  Filled 2017-11-27: qty 1
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
## 2017-11-27 NOTE — ED Provider Notes (Signed)
[facility]-EMERGENCY DEPT Provider Note   CSN: [id number] Arrival date & time: 11/27/17  1353     History   Chief Complaint Chief Complaint  Patient presents with  . Suicidal    HPI Norma Neal is a 77 y.o. female.  Patient with hx mild dementia, presents from ecf w c/o depression. Pt states feels very depressed in past few weeks. Symptoms moderate, persistent, no specific exacerbating or allev factors. Denies SI. States is unsure why God left her in this shape. Also notes decreased appetite, trouble sleeping at night. Denies current health/physical symptoms otherwise. No headaches. No chest pain. No sob. No vomiting or diarrhea. Denies a specific/single inciting event.   The history is provided by the patient and the EMS personnel.    Past Medical History:  Diagnosis Date  . Alzheimer's disease   . Anemia of other chronic disease   . Chronic kidney disease (CKD), stage III (moderate) (HCC)   . Chronic obstructive asthma, unspecified   . Dementia   . Diabetes mellitus   . Diabetic neuropathy (HCC)   . Drug induced akathisia   . Esophageal reflux   . Hypertension   . Insomnia   . Lichen planus   . 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 Nontoxic multinodular goiter   . Psychotic depression (HCC)   . Unspecified hypothyroidism   . Unspecified vitamin D deficiency     Patient Active Problem List   Diagnosis Date Noted  . Fall at home, initial encounter 10/04/2016  . Acute respiratory failure with hypoxia (HCC) 10/04/2016  . Leukocytosis 10/04/2016  . Hypokalemia 10/04/2016  . Acute lower UTI 10/04/2016  . Alzheimer's disease 08/18/2014  . Hypothyroidism 08/18/2014  . Other specified transient cerebral ischemias   . CKD (chronic kidney disease) stage 3, GFR 30-59 ml/min (HCC)   . Anemia of chronic disease 08/17/2014    Past Surgical History:  Procedure Laterality Date  . ABDOMINAL HYSTERECTOMY  yrs ago   ovaries also  .  ESOPHAGOGASTRODUODENOSCOPY (EGD) WITH PROPOFOL N/A 03/31/2014   Procedure: ESOPHAGOGASTRODUODENOSCOPY (EGD) WITH PROPOFOL;  Surgeon: Rachael Fee, MD;  Location: [facility];  Service: Endoscopy;  Laterality: N/A;  . KNEE SURGERY Left yrs ago  . partial thryoidectomy  yrs ago     OB History   None      Home Medications    Prior to Admission medications   Medication Sig Start Date End Date Taking? Authorizing Provider  albuterol (PROVENTIL HFA;VENTOLIN HFA) 108 (90 BASE) MCG/ACT inhaler Inhale 1-2 puffs into the lungs every 6 (six) hours as needed for wheezing or shortness of breath.    [provider]  albuterol (PROVENTIL) (2.5 MG/3ML) 0.083% nebulizer solution Take 2.5 mg by nebulization every 6 (six) hours as needed for wheezing or shortness of breath.    [provider]  amLODipine (NORVASC) 10 MG tablet Take 10 mg by mouth daily.    [provider]  aspirin EC 81 MG tablet Take 81 mg by mouth daily.    [provider]  brimonidine (ALPHAGAN) 0.15 % ophthalmic solution Place 1 drop into both eyes at bedtime.     [provider]  carvedilol (COREG) 3.125 MG tablet Take 1 tablet (3.125 mg total) by mouth 2 (two) times daily with a meal. 01/23/15   Rodolph Bong, MD  cefUROXime (CEFTIN) 250 MG tablet Take 1 tablet (250 mg total) by mouth 2 (two) times daily with a meal. Patient not taking: Reported on 06/18/2017  10/09/16   Marguerita Merles Latif, DO  cetirizine (ZYRTEC) 10 MG tablet Take 10 mg by mouth daily.    [provider]  cholecalciferol (VITAMIN D) 1000 UNITS tablet Take 1,000 Units by mouth daily.    [provider]  cyclobenzaprine (FLEXERIL) 5 MG tablet Take 1 tablet (5 mg total) by mouth 3 (three) times daily as needed for muscle spasms. 06/18/17   Alvira Monday, MD  docusate sodium (COLACE) 100 MG capsule Take 1 capsule (100 mg total) by mouth 2 (two) times daily as needed for mild constipation. 10/08/16    Sheikh, Kateri Mc Latif, DO  donepezil (ARICEPT) 10 MG tablet Take 10 mg by mouth at bedtime.    [provider]  escitalopram (LEXAPRO) 10 MG tablet Take 10 mg by mouth daily.    [provider]  esomeprazole (NEXIUM) 40 MG capsule Take 1 capsule (40 mg total) by mouth 2 (two) times daily before a meal. 04/17/16   Esterwood, Amy S, PA-C  gabapentin (NEURONTIN) 300 MG capsule Take 300 mg by mouth at bedtime.    [provider]  latanoprost (XALATAN) 0.005 % ophthalmic solution Place 1 drop into both eyes at bedtime.    [provider]  levothyroxine (SYNTHROID, LEVOTHROID) 88 MCG tablet Take 1 tablet (88 mcg total) by mouth daily before breakfast. 10/09/16   Marguerita Merles Latif, DO  mirtazapine (REMERON) 30 MG tablet Take 30 mg by mouth at bedtime.    [provider]  ondansetron (ZOFRAN) 4 MG tablet Take 1 tablet (4 mg total) by mouth every 6 (six) hours as needed for nausea. 10/08/16   Marguerita Merles Latif, DO  ranitidine (ZANTAC) 150 MG tablet Take 150 mg by mouth at bedtime.    [provider]  sodium bicarbonate 650 MG tablet Take 650 mg by mouth 2 (two) times daily.    [provider]  traMADol (ULTRAM) 50 MG tablet Take 1 tablet (50 mg total) by mouth every 6 (six) hours as needed for moderate pain. 10/08/16   Marguerita Merles Latif, DO  warfarin (COUMADIN) 5 MG tablet Take 5 mg by mouth daily. 09/26/16   [provider]    Family History Family History  Problem Relation Age of Onset  . Cancer Brother        lung  . Cancer Sister        ?  Marland Kitchen Breast cancer Sister        x2  . Heart attack Sister   . Breast cancer Cousin        Neice    Social History Social History   Tobacco Use  . Smoking status: Never Smoker  . Smokeless tobacco: Never Used  Substance Use Topics  . Alcohol use: No    Alcohol/week: 0.0 oz  . Drug use: No     Allergies   Patient has no known allergies.   Review of Systems Review of  Systems  Constitutional: Negative for fever.  HENT: Negative for sore throat.   Eyes: Negative for redness.  Respiratory: Negative for shortness of breath.   Cardiovascular: Negative for chest pain.  Gastrointestinal: Negative for abdominal pain.  Genitourinary: Negative for flank pain.  Musculoskeletal: Negative for neck pain.  Skin: Negative for rash.  Neurological: Negative for headaches.  Hematological: Does not bruise/bleed easily.  Psychiatric/Behavioral: Positive for dysphoric mood.     Physical Exam Updated Vital Signs BP (!) 166/76 (BP Location: Left Arm)   Pulse 65   Temp 98.1 F (  36.7 C) (Axillary)   Resp 16   SpO2 99%   Physical Exam  Constitutional: She appears well-developed and well-nourished.  HENT:  Head: Atraumatic.  Mouth/Throat: Oropharynx is clear and moist.  Eyes: Pupils are equal, round, and reactive to light. Conjunctivae are normal. No scleral icterus.  Neck: Neck supple. No tracheal deviation present. No thyromegaly present.  Cardiovascular: Normal rate, regular rhythm, normal heart sounds and intact distal pulses.  Pulmonary/Chest: Effort normal and breath sounds normal. No respiratory distress.  Abdominal: Soft. Normal appearance and bowel sounds are normal. She exhibits no distension. There is no tenderness.  Musculoskeletal: She exhibits no edema.  Neurological: She is alert.  Speech clear/fluent. Motor/sens grossly intact.   Skin: Skin is warm and dry. No rash noted.  Psychiatric:  Depressed mood, flat affect. Denies SI.   Nursing note and vitals reviewed.    ED Treatments / Results  Labs (all labs ordered are listed, but only abnormal results are displayed) Results for orders placed or performed during the hospital encounter of 11/27/17  Comprehensive metabolic panel  Result Value Ref Range   Sodium 145 135 - 145 mmol/L   Potassium 4.0 3.5 - 5.1 mmol/L   Chloride 116 (H) 98 - 111 mmol/L   CO2 23 22 - 32 mmol/L   Glucose, Bld 108  (H) 70 - 99 mg/dL   BUN 20 8 - 23 mg/dL   Creatinine, Ser 1.611.48 (H) 0.44 - 1.00 mg/dL   Calcium 9.2 8.9 - 09.610.3 mg/dL   Total Protein 7.4 6.5 - 8.1 g/dL   Albumin 4.1 3.5 - 5.0 g/dL   AST 16 15 - 41 U/L   ALT 13 0 - 44 U/L   Alkaline Phosphatase 60 38 - 126 U/L   Total Bilirubin 0.4 0.3 - 1.2 mg/dL   GFR calc non Af Amer 33 (L) >60 mL/min   GFR calc Af Amer 38 (L) >60 mL/min   Anion gap 6 5 - 15  Ethanol  Result Value Ref Range   Alcohol, Ethyl (B) <10 <10 mg/dL  Salicylate level  Result Value Ref Range   Salicylate Lvl <7.0 2.8 - 30.0 mg/dL  Acetaminophen level  Result Value Ref Range   Acetaminophen (Tylenol), Serum <10 (L) 10 - 30 ug/mL  cbc  Result Value Ref Range   WBC 5.5 4.0 - 10.5 K/uL   RBC 3.84 (L) 3.87 - 5.11 MIL/uL   Hemoglobin 11.3 (L) 12.0 - 15.0 g/dL   HCT 04.534.6 (L) 40.936.0 - 81.146.0 %   MCV 90.1 78.0 - 100.0 fL   MCH 29.4 26.0 - 34.0 pg   MCHC 32.7 30.0 - 36.0 g/dL   RDW 91.414.4 78.211.5 - 95.615.5 %   Platelets 210 150 - 400 K/uL   EKG None  Radiology No results found.  Procedures Procedures (including critical care time)  Medications Ordered in ED Medications - No data to display   Initial Impression / Assessment and Plan / ED Course  I have reviewed the triage vital signs and the nursing notes.  Pertinent labs & imaging results that were available during my care of the patient were reviewed by me and considered in my medical decision making (see chart for details).  Labs sent.  Reviewed nursing notes and prior charts for additional history.   BH team consulted.   [facility]  Labs reviewed - na/k normal.  Po fluids/meal ordered.       Final Clinical Impressions(s) / ED Diagnoses   Final  diagnoses:  None    ED Discharge Orders    None       Cathren Laine, MD 11/27/17 1513

## 2017-11-27 NOTE — ED Triage Notes (Signed)
Per EMS:  Pt reports to EMS that she wants to kill herself. Pt has a plan to save up all her medicine being given to her at the rehab center [facility] and take them all at once to overdose. Pt has a hx of dementia and alzheimer, MDD, DM, COPD and HTN.

## 2017-11-27 NOTE — ED Notes (Signed)
Pt stated "I didn't want to live anymore.  I didn't like the NH I was in.  The physical therapist told me I could go up and down the halls in my w/c but the other staff told me I couldn't."

## 2017-11-28 DIAGNOSIS — R45851 Suicidal ideations: Secondary | ICD-10-CM

## 2017-11-28 DIAGNOSIS — F329 Major depressive disorder, single episode, unspecified: Secondary | ICD-10-CM

## 2017-11-28 LAB — URINALYSIS, ROUTINE W REFLEX MICROSCOPIC
BILIRUBIN URINE: NEGATIVE
GLUCOSE, UA: NEGATIVE mg/dL
HGB URINE DIPSTICK: NEGATIVE
Ketones, ur: NEGATIVE mg/dL
NITRITE: NEGATIVE
Protein, ur: NEGATIVE mg/dL
Specific Gravity, Urine: 1.025 (ref 1.005–1.030)
pH: 5 (ref 5.0–8.0)

## 2017-11-28 NOTE — ED Notes (Signed)
Pt stated "I'm still thinking about it."

## 2017-11-28 NOTE — ED Notes (Signed)
[facility]  [facility].

## 2017-11-28 NOTE — ED Notes (Signed)
[facility] at [phone]. Report given to Joellyn Quailsiane Bustle, RN. Patient is IVC and requires [employer]. [facility] [employer] [facility] requesting [facility]. [facility] requests that, when the patient is transported, they be called with a recent set of vital signs and a list of recently administered medications.

## 2017-11-28 NOTE — Consult Note (Addendum)
[place] Psychiatry Consult   Reason for Consult:  Suicidal ideation Referring Physician:  EDP Patient Identification: Norma Neal MRN:  [id number] Principal Diagnosis: Depression with suicidal ideation Diagnosis:   Patient Active Problem List   Diagnosis Date Noted  . Fall at home, initial encounter [W19.Merril Abbe, P71.062] 10/04/2016  . Acute respiratory failure with hypoxia ([place]) [J96.01] 10/04/2016  . Leukocytosis [D72.829] 10/04/2016  . Hypokalemia [E87.6] 10/04/2016  . Acute lower UTI [N39.0] 10/04/2016  . Alzheimer's disease [G30.9, F02.80] 08/18/2014  . Hypothyroidism [E03.9] 08/18/2014  . Other specified transient cerebral ischemias [G45.8]   . CKD (chronic kidney disease) stage 3, GFR 30-59 ml/min (HCC) [N18.3]   . Anemia of chronic disease [D63.8] 08/17/2014    Total Time spent with patient: 45 minutes  Subjective:   Norma Neal is a 77 y.o. female patient admitted with suicidal ideation.  HPI: Pt was seen and chart reviewed with treatment team and Dr Mariea Clonts. Pt stated she lives in at a facility and likes it there but recently heard some of the aides that work there talking about her and she didn't like that. Pt stated she became suicidal and her plan is to starve herself. She stated when she returns she wants to go to a different unit. Pt has a history of depression but stated she doesn't know what meds she takes. Pt stated she sees people in the form of her mother and sister sitting on chairs but they are not scary to her. Pt denies hearing voices, denies homicidal ideation and does not appear to be responding to internal stimuli. Pt's UDS and BAL are negative. UA is negative, EKG is unremarkable. Pt has a history of Alzheimer's dementia but this appears to be mild due to her ability to clearly recall the reason she is at the hospital.  Pt wears incontinent briefs, uses a walker or wheelchair to ambulate and needs assistance to take a shower but is able to do some self care  ADL's on her own. Pt would benefit from an inpatient gero psych admission for crisis stabilization and medication management.   Past Psychiatric History: As above  Risk to Self: Suicidal Ideation: Yes-Currently Present Suicidal Intent: No Is patient at risk for suicide?: Yes Suicidal Plan?: Yes-Currently Present Specify Current Suicidal Plan: starve herself or save up meds & OD Access to Means: Yes Specify Access to Suicidal Means: can store up meds given to her by facility What has been your use of drugs/alcohol within the last 12 months?: none How many times?: (unclear- at least 1 x- when pt starved self x 3 years) Other Self Harm Risks: none reported Triggers for Past Attempts: Unknown Intentional Self Injurious Behavior: None Risk to Others: Homicidal Ideation: No Thoughts of Harm to Others: No Current Homicidal Intent: No Current Homicidal Plan: No Access to Homicidal Means: No History of harm to others?: No Assessment of Violence: None Noted Does patient have access to weapons?: No Criminal Charges Pending?: No Does patient have a court date: No Prior Inpatient Therapy: Prior Inpatient Therapy: No Prior Outpatient Therapy: Prior Outpatient Therapy: No Does patient have an ACCT team?: No Does patient have Intensive In-House Services?  : No Does patient have [facility] services? : No Does patient have P4CC services?: No  Past Medical History:  Past Medical History:  Diagnosis Date  . Alzheimer's disease   . Anemia of other chronic disease   . Chronic kidney disease (CKD), stage III (moderate) (HCC)   . Chronic obstructive asthma, unspecified   .  Dementia   . Diabetes mellitus   . Diabetic neuropathy ([place])   . Drug induced akathisia   . Esophageal reflux   . Hypertension   . Insomnia   . Lichen planus   . 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 Nontoxic multinodular goiter   . 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 Unspecified hypothyroidism   . Unspecified vitamin D  deficiency     Past Surgical History:  Procedure Laterality Date  . ABDOMINAL HYSTERECTOMY  yrs ago   ovaries also  . ESOPHAGOGASTRODUODENOSCOPY (EGD) WITH PROPOFOL N/A 03/31/2014   Procedure: ESOPHAGOGASTRODUODENOSCOPY (EGD) WITH PROPOFOL;  Surgeon: Milus Banister, MD;  Location: [facility];  Service: Endoscopy;  Laterality: N/A;  . KNEE SURGERY Left yrs ago  . partial thryoidectomy  yrs ago   Family History:  Family History  Problem Relation Age of Onset  . Cancer Brother        lung  . Cancer Sister        ?  Marland Kitchen Breast cancer Sister        x2  . Heart attack Sister   . Breast cancer Cousin        Neice   Family Psychiatric  History: Unknown Social History:  Social History   Substance and Sexual Activity  Alcohol Use No  . Alcohol/week: 0.0 oz     Social History   Substance and Sexual Activity  Drug Use No    Social History   Socioeconomic History  . Marital status: Widowed    Spouse name: Not on file  . Number of children: Not on file  . Years of education: Not on file  . Highest education level: Not on file  Occupational History  . Not on file  Social Needs  . Financial resource strain: Not on file  . Food insecurity:    Worry: Not on file    Inability: Not on file  . Transportation needs:    Medical: Not on file    Non-medical: Not on file  Tobacco Use  . Smoking status: Never Smoker  . Smokeless tobacco: Never Used  Substance and Sexual Activity  . Alcohol use: No    Alcohol/week: 0.0 oz  . Drug use: No  . Sexual activity: Never  Lifestyle  . Physical activity:    Days per week: Not on file    Minutes per session: Not on file  . Stress: Not on file  Relationships  . Social connections:    Talks on phone: Not on file    Gets together: Not on file    Attends religious service: Not on file    Active member of club or organization: Not on file    Attends meetings of clubs or organizations: Not on file    Relationship status: Not on file   Other Topics Concern  . Not on file  Social History Narrative  . Not on file   Additional Social History: N/A    Allergies:  No Known Allergies  Labs:  Results for orders placed or performed during the hospital encounter of 11/27/17 (from the past 48 hour(s))  Comprehensive metabolic panel     Status: Abnormal   Collection Time: 11/27/17  2:25 PM  Result Value Ref Range   Sodium 145 135 - 145 mmol/L   Potassium 4.0 3.5 - 5.1 mmol/L   Chloride 116 (H) 98 - 111 mmol/L   CO2 23 22 - 32 mmol/L   Glucose, Bld 108 (H) 70 -  99 mg/dL   BUN 20 8 - 23 mg/dL   Creatinine, Ser 1.48 (H) 0.44 - 1.00 mg/dL   Calcium 9.2 8.9 - 10.3 mg/dL   Total Protein 7.4 6.5 - 8.1 g/dL   Albumin 4.1 3.5 - 5.0 g/dL   AST 16 15 - 41 U/L   ALT 13 0 - 44 U/L   Alkaline Phosphatase 60 38 - 126 U/L   Total Bilirubin 0.4 0.3 - 1.2 mg/dL   GFR calc non Af Amer 33 (L) >60 mL/min   GFR calc Af Amer 38 (L) >60 mL/min    Comment: (NOTE) The eGFR has been calculated using the CKD EPI equation. This calculation has not been validated in all clinical situations. eGFR's persistently <60 mL/min signify possible Chronic Kidney Disease.    Anion gap 6 5 - 15    Comment: Performed at [facility], [place] [address] [place], [place] [address]  Ethanol     Status: None   Collection Time: 11/27/17  2:25 PM  Result Value Ref Range   Alcohol, Ethyl (B) <10 <10 mg/dL    Comment: (NOTE) Lowest detectable limit for serum alcohol is 10 mg/dL. For medical purposes only. Performed at [facility], [place] [address] [place], [place] [address]   Salicylate level     Status: None   Collection Time: 11/27/17  2:25 PM  Result Value Ref Range   Salicylate Lvl <6.2 2.8 - 30.0 mg/dL    Comment: Performed at [facility], [place] [address] [place], [place] [address]  Acetaminophen level     Status: Abnormal   Collection Time: 11/27/17  2:25 PM  Result Value Ref Range    Acetaminophen (Tylenol), Serum <10 (L) 10 - 30 ug/mL    Comment: (NOTE) Therapeutic concentrations vary significantly. A range of 10-30 ug/mL  may be an effective concentration for many patients. However, some  are best treated at concentrations outside of this range. Acetaminophen concentrations >150 ug/mL at 4 hours after ingestion  and >50 ug/mL at 12 hours after ingestion are often associated with  toxic reactions. Performed at [facility], [place] [address], [place], [place] [address]   cbc     Status: Abnormal   Collection Time: 11/27/17  2:25 PM  Result Value Ref Range   WBC 5.5 4.0 - 10.5 K/uL   RBC 3.84 (L) 3.87 - 5.11 MIL/uL   Hemoglobin 11.3 (L) 12.0 - 15.0 g/dL   HCT 34.6 (L) 36.0 - 46.0 %   MCV 90.1 78.0 - 100.0 fL   MCH 29.4 26.0 - 34.0 pg   MCHC 32.7 30.0 - 36.0 g/dL   RDW 14.4 11.5 - 15.5 %   Platelets 210 150 - 400 K/uL    Comment: Performed at [facility], [place] [address], [place],  [address]  Rapid urine drug screen (hospital performed)     Status: None   Collection Time: 11/27/17  5:59 PM  Result Value Ref Range   Opiates NONE DETECTED NONE DETECTED   Cocaine NONE DETECTED NONE DETECTED   Benzodiazepines NONE DETECTED NONE DETECTED   Amphetamines NONE DETECTED NONE DETECTED   Tetrahydrocannabinol NONE DETECTED NONE DETECTED   Barbiturates NONE DETECTED NONE DETECTED    Comment: (NOTE) DRUG SCREEN FOR MEDICAL PURPOSES ONLY.  IF CONFIRMATION IS NEEDED FOR ANY PURPOSE, NOTIFY LAB WITHIN 5 DAYS. LOWEST DETECTABLE LIMITS FOR URINE DRUG SCREEN Drug Class  Cutoff (ng/mL) Amphetamine and metabolites    1000 Barbiturate and metabolites    200 Benzodiazepine                 628 Tricyclics and metabolites     300 Opiates and metabolites        300 Cocaine and metabolites        300 THC                            50 Performed at [facility], [place] [address] [place], [place]  [address]   Urinalysis, Routine w reflex microscopic     Status: Abnormal   Collection Time: 11/28/17 11:13 AM  Result Value Ref Range   Color, Urine YELLOW YELLOW   APPearance CLEAR CLEAR   Specific Gravity, Urine 1.025 1.005 - 1.030   pH 5.0 5.0 - 8.0   Glucose, UA NEGATIVE NEGATIVE mg/dL   Hgb urine dipstick NEGATIVE NEGATIVE   Bilirubin Urine NEGATIVE NEGATIVE   Ketones, ur NEGATIVE NEGATIVE mg/dL   Protein, ur NEGATIVE NEGATIVE mg/dL   Nitrite NEGATIVE NEGATIVE   Leukocytes, UA LARGE (A) NEGATIVE   RBC / HPF 0-5 0 - 5 RBC/hpf   WBC, UA 0-5 0 - 5 WBC/hpf   Bacteria, UA FEW (A) NONE SEEN   Squamous Epithelial / LPF 0-5 0 - 5   Mucus PRESENT     Comment: Performed at [facility], [place] [address] [place], [place] [address]    Current Facility-Administered Medications  Medication Dose Route Frequency Provider Last Rate Last Dose  . aspirin EC tablet 81 mg  81 mg Oral Daily Daleen Bo, MD   81 mg at 11/28/17 1027  . brimonidine (ALPHAGAN) 0.2 % ophthalmic solution 1 drop  1 drop Both Eyes BID Daleen Bo, MD   1 drop at 11/28/17 1035   And  . timolol (TIMOPTIC) 0.5 % ophthalmic solution 1 drop  1 drop Both Eyes BID Daleen Bo, MD   1 drop at 11/28/17 1035  . cholecalciferol (VITAMIN D) tablet 1,000 Units  1,000 Units Oral Daily Daleen Bo, MD   1,000 Units at 11/28/17 1027  . donepezil (ARICEPT) tablet 10 mg  10 mg Oral QHS Daleen Bo, MD   10 mg at 11/27/17 2339  . escitalopram (LEXAPRO) tablet 20 mg  20 mg Oral Daily Daleen Bo, MD   20 mg at 11/28/17 1026  . famotidine (PEPCID) tablet 20 mg  20 mg Oral BID Daleen Bo, MD   20 mg at 11/28/17 1025  . ipratropium (ATROVENT) 0.03 % nasal spray 2 spray  2 spray Each Nare TID Daleen Bo, MD   2 spray at 11/28/17 1033  . latanoprost (XALATAN) 0.005 % ophthalmic solution 1 drop  1 drop Both Eyes QHS Daleen Bo, MD   1 drop at 11/27/17 2345  . levothyroxine (SYNTHROID, LEVOTHROID) tablet  50 mcg  50 mcg Oral QAC breakfast Daleen Bo, MD   50 mcg at 11/28/17 0815  . Melatonin TABS 5 mg  5 mg Oral QHS Daleen Bo, MD   5 mg at 11/27/17 2339  . metoprolol succinate (TOPROL-XL) 24 hr tablet 50 mg  50 mg Oral QHS Daleen Bo, MD   50 mg at 11/27/17 2338  . mirtazapine (REMERON) tablet 7.5 mg  7.5 mg Oral QHS Daleen Bo, MD   7.5 mg at 11/27/17 2338  . pantoprazole (PROTONIX) EC tablet 40 mg  40 mg Oral Daily Daleen Bo, MD   40 mg at 11/28/17 1035   Current Outpatient Medications  Medication Sig Dispense Refill  . acetaminophen (TYLENOL) 500 MG tablet Take 500 mg by mouth 2 (two) times daily.    Marland Kitchen amLODipine (NORVASC) 5 MG tablet Take 5 mg by mouth daily.     Marland Kitchen aspirin EC 81 MG tablet Take 81 mg by mouth daily.    . brimonidine-timolol (COMBIGAN) 0.2-0.5 % ophthalmic solution Place 1 drop into both eyes every 12 (twelve) hours.    . cholecalciferol (VITAMIN D) 1000 UNITS tablet Take 1,000 Units by mouth daily.    Marland Kitchen donepezil (ARICEPT) 10 MG tablet Take 10 mg by mouth at bedtime.    Marland Kitchen escitalopram (LEXAPRO) 20 MG tablet Take 20 mg by mouth daily.     Marland Kitchen esomeprazole (NEXIUM) 40 MG capsule Take 1 capsule (40 mg total) by mouth 2 (two) times daily before a meal. 60 capsule 3  . hydrocortisone (ANUSOL-HC) 2.5 % rectal cream Place 1 application rectally 2 (two) times daily.    Marland Kitchen ipratropium (ATROVENT) 0.03 % nasal spray Place 2 sprays into both nostrils 3 (three) times daily.    Marland Kitchen latanoprost (XALATAN) 0.005 % ophthalmic solution Place 1 drop into both eyes at bedtime.    Marland Kitchen levothyroxine (SYNTHROID, LEVOTHROID) 50 MCG tablet Take 50 mcg by mouth daily before breakfast.    . Melatonin 5 MG CAPS Take 5 mg by mouth at bedtime.    . metoprolol succinate (TOPROL-XL) 50 MG 24 hr tablet Take 50 mg by mouth at bedtime. Take with or immediately following a meal.    . mirtazapine (REMERON) 15 MG tablet Take 7.5 mg by mouth at bedtime.     . ranitidine (ZANTAC) 150 MG tablet Take  150 mg by mouth at bedtime.    . sennosides-docusate sodium (SENOKOT-S) 8.6-50 MG tablet Take 1 tablet by mouth 2 (two) times daily.    . sodium bicarbonate 650 MG tablet Take 650 mg by mouth 2 (two) times daily.    . carvedilol (COREG) 3.125 MG tablet Take 1 tablet (3.125 mg total) by mouth 2 (two) times daily with a meal. (Patient not taking: Reported on 11/27/2017) 60 tablet 0    Musculoskeletal: Strength & Muscle Tone: within normal limits Gait & Station: not tested, walks with a walker Patient leans: N/A  Psychiatric Specialty Exam: Physical Exam  Nursing note and vitals reviewed. Constitutional: She is oriented to person, place, and time. She appears well-developed and well-nourished.  HENT:  Head: Normocephalic and atraumatic.  Neck: Normal range of motion.  Respiratory: Effort normal.  Musculoskeletal: Normal range of motion.  Neurological: She is alert and oriented to person, place, and time.  Psychiatric: Her speech is normal and behavior is normal. Thought content normal. Cognition and memory are normal. She expresses impulsivity. She exhibits a depressed mood.    Review of Systems  Psychiatric/Behavioral: Positive for depression and suicidal ideas. Negative for hallucinations and substance abuse.  All other systems reviewed and are negative.   Blood pressure (!) 143/89, pulse 63, temperature 98.6 F (37 C), temperature source Oral, resp. rate 18, SpO2 99 %.There is no height or weight on file to calculate BMI.  General Appearance: Casual  Eye Contact:  Good  Speech:  Clear and Coherent  Volume:  Normal  Mood:  Depressed  Affect:  Congruent and Depressed  Thought Process:  Coherent, Linear and Descriptions of Associations: Intact  Orientation:  Full (Time, Place, and  Person)  Thought Content:  Logical  Suicidal Thoughts:  Yes.  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 She is unable to recall taking antipsychotic medications.   Assets:  Agricultural consultant Housing  ADL's:  Intact  Cognition:  WNL  Sleep:   Fair     Treatment Plan Summary: Daily contact with patient to assess and evaluate symptoms and progress in treatment and Medication management ( see MAR ) -Crisis stabilization  Disposition: Recommend psychiatric Inpatient admission when medically cleared. TTS to seek [facility] psychiatric placement.  Ethelene Hal, NP 11/28/2017 12:18 PM   Patient seen face-to-face for psychiatric evaluation, chart reviewed and case discussed with the physician extender and developed treatment plan. Reviewed the information documented and agree with the treatment plan.  Buford Dresser, DO 11/28/17 5:58 PM

## 2017-11-28 NOTE — Progress Notes (Signed)
CSW received a call from Romero BellingMary Walker [facility] Number for report is: [phone] Pt's unit/room/bed number will be: [facility] 5[facility] Accepting physician: Dr. Althea Grimmerosado   Pt can arrive ASAP on 11/28/17 or anytime on 11/29/17  Pt is IVC'd.  CSW will update RN.  Dorothe PeaJonathan F. Dontre Laduca, LCSW, LCAS, CSI Clinical Social Worker Ph: [phone]

## 2017-11-28 NOTE — BH Assessment (Signed)
[facility]  Per Juanetta BeetsJacqueline Norman, DO, this pt requires psychiatric hospitalization at this time.  Dr Sharma CovertNorman also finds that pt meets criteria for IVC, which she has initiated.  [place] [facility] [facility], and at 12:49 Etta QuillMagistrate Tylan Briguglio confirms receipt.  He has since faxed Findings and Custody Order to this Clinical research associatewriter.  At 13:01 I called [employer] and spoke to American Electric Powerperator Davis, who took demographic information, agreeing to dispatch law enforcement to fill out Return of Service.  [employer] officer then presented at [facility], completing Return of Service.   The following facilities have been contacted to seek placement for this pt, with results as noted:  Beds available, information sent, decision pending:  [facility] [address] [facility]   At capacity:  [facility]   [place] Fredrick [place], [place] Health Coordinator [phone]

## 2017-11-28 NOTE — ED Notes (Signed)
Pt requested medication to help with diarrhea and nasal congestion. RN made aware.

## 2017-11-28 NOTE — Progress Notes (Signed)
RN updated.  Please reconsult if future social work needs arise.  CSW signing off, as social work intervention is no longer needed.  Tierre Gerard F. Dawon Troop, LCSW, LCAS, CSI Clinical Social Worker Ph: [phone]       

## 2017-11-28 NOTE — ED Notes (Signed)
Bed: WA01 Expected date:  Expected time:  Means of arrival:  Comments: Room 28

## 2022-08-28 DEATH — deceased
